# Patient Record
Sex: Female | Born: 1940 | Race: White | Hispanic: No | Smoking: Former smoker
Health system: Southern US, Community
[De-identification: ages and names within clinical notes are randomized; demographics above are authoritative.]

## PROBLEM LIST (undated history)

## (undated) DIAGNOSIS — I10 Essential (primary) hypertension: Secondary | ICD-10-CM

## (undated) DIAGNOSIS — N390 Urinary tract infection, site not specified: Secondary | ICD-10-CM

## (undated) DIAGNOSIS — E785 Hyperlipidemia, unspecified: Secondary | ICD-10-CM

## (undated) DIAGNOSIS — R51 Headache: Secondary | ICD-10-CM

## (undated) DIAGNOSIS — F329 Major depressive disorder, single episode, unspecified: Secondary | ICD-10-CM

## (undated) DIAGNOSIS — G8929 Other chronic pain: Secondary | ICD-10-CM

## (undated) DIAGNOSIS — M1990 Unspecified osteoarthritis, unspecified site: Secondary | ICD-10-CM

## (undated) DIAGNOSIS — M810 Age-related osteoporosis without current pathological fracture: Secondary | ICD-10-CM

## (undated) DIAGNOSIS — F32A Depression, unspecified: Secondary | ICD-10-CM

## (undated) DIAGNOSIS — D649 Anemia, unspecified: Secondary | ICD-10-CM

## (undated) DIAGNOSIS — J449 Chronic obstructive pulmonary disease, unspecified: Secondary | ICD-10-CM

## (undated) DIAGNOSIS — I509 Heart failure, unspecified: Secondary | ICD-10-CM

## (undated) DIAGNOSIS — H269 Unspecified cataract: Secondary | ICD-10-CM

## (undated) DIAGNOSIS — K219 Gastro-esophageal reflux disease without esophagitis: Secondary | ICD-10-CM

## (undated) DIAGNOSIS — J8410 Pulmonary fibrosis, unspecified: Secondary | ICD-10-CM

## (undated) DIAGNOSIS — R519 Headache, unspecified: Secondary | ICD-10-CM

## (undated) HISTORY — PX: TUBAL LIGATION: SHX77

## (undated) HISTORY — DX: Depression, unspecified: F32.A

## (undated) HISTORY — DX: Essential (primary) hypertension: I10

## (undated) HISTORY — DX: Unspecified cataract: H26.9

## (undated) HISTORY — DX: Chronic obstructive pulmonary disease, unspecified: J44.9

## (undated) HISTORY — DX: Headache, unspecified: R51.9

## (undated) HISTORY — DX: Other chronic pain: G89.29

## (undated) HISTORY — DX: Age-related osteoporosis without current pathological fracture: M81.0

## (undated) HISTORY — PX: CATARACT EXTRACTION: SUR2

## (undated) HISTORY — DX: Anemia, unspecified: D64.9

## (undated) HISTORY — DX: Headache: R51

## (undated) HISTORY — PX: ABDOMINAL HYSTERECTOMY: SHX81

## (undated) HISTORY — DX: Major depressive disorder, single episode, unspecified: F32.9

## (undated) HISTORY — DX: Hyperlipidemia, unspecified: E78.5

## (undated) HISTORY — PX: PARTIAL HYSTERECTOMY: SHX80

---

## 1985-02-19 HISTORY — PX: NECK SURGERY: SHX720

## 2004-06-13 ENCOUNTER — Encounter: Payer: Self-pay | Admitting: Emergency Medicine

## 2009-02-09 ENCOUNTER — Encounter: Payer: Self-pay | Admitting: Emergency Medicine

## 2009-02-13 ENCOUNTER — Encounter: Payer: Self-pay | Admitting: Emergency Medicine

## 2009-06-01 ENCOUNTER — Encounter: Payer: Self-pay | Admitting: Emergency Medicine

## 2009-08-19 ENCOUNTER — Encounter: Payer: Self-pay | Admitting: Emergency Medicine

## 2009-11-22 ENCOUNTER — Encounter: Payer: Self-pay | Admitting: Emergency Medicine

## 2009-11-24 ENCOUNTER — Encounter: Payer: Self-pay | Admitting: Emergency Medicine

## 2009-12-20 ENCOUNTER — Encounter: Payer: Self-pay | Admitting: Emergency Medicine

## 2010-01-26 DIAGNOSIS — E119 Type 2 diabetes mellitus without complications: Secondary | ICD-10-CM

## 2010-01-26 DIAGNOSIS — I10 Essential (primary) hypertension: Secondary | ICD-10-CM | POA: Insufficient documentation

## 2010-01-27 ENCOUNTER — Ambulatory Visit: Payer: Self-pay | Admitting: Emergency Medicine

## 2010-01-27 DIAGNOSIS — I272 Pulmonary hypertension, unspecified: Secondary | ICD-10-CM | POA: Insufficient documentation

## 2010-01-27 DIAGNOSIS — R51 Headache: Secondary | ICD-10-CM

## 2010-01-27 DIAGNOSIS — D869 Sarcoidosis, unspecified: Secondary | ICD-10-CM

## 2010-01-27 DIAGNOSIS — R519 Headache, unspecified: Secondary | ICD-10-CM | POA: Insufficient documentation

## 2010-01-27 DIAGNOSIS — J8410 Pulmonary fibrosis, unspecified: Secondary | ICD-10-CM

## 2010-01-30 ENCOUNTER — Telehealth (INDEPENDENT_AMBULATORY_CARE_PROVIDER_SITE_OTHER): Payer: Self-pay | Admitting: *Deleted

## 2010-03-02 ENCOUNTER — Telehealth: Payer: Self-pay | Admitting: Emergency Medicine

## 2010-03-02 ENCOUNTER — Ambulatory Visit
Admission: RE | Admit: 2010-03-02 | Discharge: 2010-03-02 | Payer: Self-pay | Source: Home / Self Care | Attending: Emergency Medicine | Admitting: Emergency Medicine

## 2010-03-17 ENCOUNTER — Ambulatory Visit: Admission: RE | Admit: 2010-03-17 | Discharge: 2010-03-17 | Payer: Self-pay | Source: Home / Self Care

## 2010-03-17 ENCOUNTER — Ambulatory Visit (HOSPITAL_COMMUNITY)
Admission: RE | Admit: 2010-03-17 | Discharge: 2010-03-17 | Payer: Self-pay | Source: Home / Self Care | Attending: Emergency Medicine | Admitting: Emergency Medicine

## 2010-03-20 ENCOUNTER — Telehealth (INDEPENDENT_AMBULATORY_CARE_PROVIDER_SITE_OTHER): Payer: Self-pay | Admitting: *Deleted

## 2010-03-21 NOTE — Assessment & Plan Note (Signed)
Summary: dyspnea, ? ILD, ? PAH   Visit Type:  Initial Consult Copy to:  Dr. Juleen China Primary Jaxsyn Azam/Referring Giara Mcgaughey:  Dr. Juleen China  CC:  Pulmonary consult for [facility].  History of Present Illness: 70 yo woman, former tobacco, recently dx w DM. Referred by Dr Leonor Liv for dyspnea and hx ILD followed by Dr DeCoy in [place], initially received dx of IPF. Underwent FOB and Bx in 2005, treated with pred and cytoxan in the past. Then had nodules on arm that were bx and showed sarcoidosis Summer 2011. Last CT scan was prob 01/2009 (she isn't sure). She has also been told that she has Pulm HTN by TTE (she can't remember when).   She has had an ONO that showed she needs O2 at night. She wears 3L/min at bedtime. She had PSG last summer [facility] - said she does not have OSA but needs O2.   She complains today of exertional SOB, rare CP. Able to walk thru a store without stopping, trouble stairs or hills.       Preventive Screening-Counseling & Management  Alcohol-Tobacco     Smoking Status: quit     Packs/Day: 1.0     Year Started: 1965     Year Quit: 1973  Current Medications (verified): 1)  Novolog Flexpen 100 Unit/ml Soln (Insulin Aspart) .... As Directed 2)  Levemir Flexpen 100 Unit/ml Soln (Insulin Detemir) .... As Directed 3)  Cyclobenzaprine Hcl 10 Mg Tabs (Cyclobenzaprine Hcl) .Marland Kitchen.. 1 Three Times A Day As Needed 4)  Tramadol Hcl 50 Mg Tabs (Tramadol Hcl) .Marland Kitchen.. 1 Three Times A Day As Needed 5)  Furosemide 40 Mg Tabs (Furosemide) .Marland Kitchen.. 1 Once Daily 6)  Omeprazole 20 Mg Cpdr (Omeprazole) .Marland Kitchen.. 1 Once Daily 7)  Spironolactone 25 Mg Tabs (Spironolactone) .Marland Kitchen.. 1 Two Times A Day 8)  Citalopram Hydrobromide 40 Mg Tabs (Citalopram Hydrobromide) .Marland Kitchen.. 1 Once Daily 9)  Ropinirole Hcl 3 Mg Tabs (Ropinirole Hcl) .Marland Kitchen.. 1 Two Times A Day 10)  Klor-Con 10 10 Meq Cr-Tabs (Potassium Chloride) .... 2 By Mouth Every Morning 11)  Divalproex Sodium 500 Mg Tbec (Divalproex Sodium) .... 2 Once  Daily  Allergies (verified): 1)  ! Pcn  Past History:  Past Medical History:  HEADACHE, CHRONIC (ICD-784.0) HYPERTENSION (ICD-401.9) DIABETES, TYPE 2 (ICD-250.00)  Past Surgical History: Neck surgery in 1987 Partial hysterectomy Tubal ligation  Family History: Family History Prostate Cancer---brother Family History C V A / Stroke ---mother Family History Asthma---son and granddaughter Family History Emphysema ---father Family History Lung Cancer---sister  Social History: Widowed Disabled Patient states former smoker.  Lives alone Smoking Status:  quit Packs/Day:  1.0  Vital Signs:  Patient profile:   70 year old female Height:      66 inches (167.64 cm) Weight:      185.38 pounds (84.26 kg) BMI:     30.03 O2 Sat:      92 % on Room air Temp:     98.2 degrees F (36.78 degrees C) oral Pulse rate:   99 / minute BP sitting:   122 / 64  (left arm) Cuff size:   large  Vitals Entered By: Michel Bickers CMA (January 27, 2010 3:37 PM)  O2 Sat at Rest %:  92 O2 Flow:  Room air CC: Pulmonary consult for [facility] Is Patient Diabetic? Yes Comments Medications reviewed with patient Michel Bickers [facility]  January 27, 2010 3:38 PM  Ambulatory Pulse Oximetry  Resting; HR_100____    02 Sat__95% RA___  Lap1 (185 feet)   HR_103____   02 Sat__92% RA___ Lap2 (185 feet)   HR_116____   02 Sat_90% RA____    Lap3 (185 feet)   HR_104____   02 Sat__91% RA___ **For approx 1/2 of the first part of 3rd lap, pts o2 sat stayed between 87-89% RA but did increased to low 90's% RA at then end of the 3rd lap. _x__Test Completed without Difficulty ___Test Stopped due to:  Gweneth Dimitri RN  January 27, 2010 4:44 PM    Physical Exam  General:  normal appearance and healthy appearing.   Head:  alopecia, no lesions Eyes:  conjunctiva and sclera clear Nose:  no deformity, discharge, inflammation, or lesions Mouth:  no deformity or lesions Neck:  no stridor Chest Wall:  no deformities  noted Lungs:  few soft insp crackles B Heart:  tachy, intact S1 and S2, holosystolic M, no gallop Abdomen:  benign Extremities:  no edema Neurologic:  poor historian, unable to tell me when events took place or tests were done but reorients quickly, interacts normally otherwise. 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 Likely sarcoidosis if this dx is accurate - need to review her imaging, likely repeat Ct scan if none done recently  Problem # 2:  ? of PULMONARY SARCOIDOSIS (ICD-135) The likely source of her ILD if present.  - will get all bx data, testing - consider rx once dx firm; she has new DM, may need a steroid sparing agent  Problem # 3:  ? of PULMONARY HYPERTENSION (ICD-416.8)  - will obtain TTE's from [place]  Orders: Consultation Level IV (69629)  Problem # 4:  DIABETES, TYPE 2 (ICD-250.00)  Her updated medication list for this problem includes:    Novolog Flexpen 100 Unit/ml Soln (Insulin aspart) .Marland Kitchen... As directed    Levemir Flexpen 100 Unit/ml Soln (Insulin detemir) .Marland Kitchen... As directed  Problem # 5:  HYPERTENSION (ICD-401.9)  Her updated medication list for this problem includes:    Furosemide 40 Mg Tabs (Furosemide) .Marland Kitchen... 1 once daily    Spironolactone 25 Mg Tabs (Spironolactone) .Marland Kitchen... 1 two times a day  Medications Added to Medication List This Visit: 1)  Klor-con 10 10 Meq Cr-tabs (Potassium chloride) .... 2 by mouth every morning  Patient Instructions: 1)  We will obtain [facility] records from [facility], including your CT scan's, biopsy results, echocardiogram results, before we order any new testing.  2)  Walking oximetry today 3)  Follow up with Dr Delton Coombes in 1 month. At that time we will decide about repeating your CT scan, your  echocardiogram, or about starting a medication for sarcoidosis.

## 2010-03-23 NOTE — Assessment & Plan Note (Signed)
Summary: ILD, sarcoidosis   Visit Type:  Follow-up Copy to:  Dr. Juleen China Primary Provider/Referring Provider:  Dr. Juleen China  CC:  ILD.Marland KitchenMarland Kitchenpt c/o prod cough x3 weeks...sputum is yellow...no change better or worse in her breathing.  History of Present Illness: 70 yo woman, former tobacco, recently dx w DM. Referred by Dr Leonor Liv for dyspnea and hx ILD followed by Dr DeCoy in [place], initially received dx of IPF. Underwent FOB and Bx in 2005, treated with pred and cytoxan in the past. Then had nodules on arm that were bx and showed  probable sarcoidosis Summer 2011 ([place]). Last CT scan was prob 11/2009 (she isn't sure). 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   She has had an ONO that showed she needs O2 at night. She wears 3L/min at bedtime. She had PSG last summer [facility] - said she does not have OSA but needs O2.   She complains today of exertional SOB, rare CP. Able to walk thru a store without stopping, trouble stairs or hills.   ROV 03/02/10 -- returns for f/u of ILD, suspected sarcoidosis based on skin bx. Also some secondary PAH. Last prednisone was 2009.       Preventive Screening-Counseling & Management  Alcohol-Tobacco     Smoking Status: quit     Packs/Day: 1.0     Year Started: 1965     Year Quit: 1973  Current Medications (verified): 1)  Novolog Flexpen 100 Unit/ml Soln (Insulin Aspart) .... As Directed 2)  Levemir Flexpen 100 Unit/ml Soln (Insulin Detemir) .... As Directed 3)  Cyclobenzaprine Hcl 10 Mg Tabs (Cyclobenzaprine Hcl) .Marland Kitchen.. 1 Three Times A Day As Needed 4)  Tramadol Hcl 50 Mg Tabs (Tramadol Hcl) .Marland Kitchen.. 1 Three Times A Day As Needed 5)  Furosemide 40 Mg Tabs (Furosemide) .Marland Kitchen.. 1 Once Daily 6)  Omeprazole 20 Mg Cpdr (Omeprazole) .Marland Kitchen.. 1 Once Daily 7)  Spironolactone 25 Mg Tabs (Spironolactone) .Marland Kitchen.. 1 Two Times A Day 8)  Citalopram Hydrobromide 40 Mg Tabs (Citalopram Hydrobromide) .Marland Kitchen.. 1 Once Daily 9)  Ropinirole Hcl 3 Mg Tabs (Ropinirole Hcl) .Marland Kitchen.. 1  Two Times A Day 10)  Klor-Con 10 10 Meq Cr-Tabs (Potassium Chloride) .... 2 By Mouth Every Morning 11)  Divalproex Sodium 500 Mg Tbec (Divalproex Sodium) .... 2 Once Daily 12)  Tessalon 200 Mg Caps (Benzonatate) .Marland Kitchen.. 1 By Mouth Three Times A Day As Needed  Allergies (verified): 1)  ! Pcn  Vital Signs:  Patient profile:   70 year old female Height:      66 inches (167.64 cm) Weight:      187.38 pounds (85.17 kg) BMI:     30.35 O2 Sat:      92 % on Room air Temp:     97.9 degrees F (36.61 degrees C) oral Pulse rate:   97 / minute BP sitting:   120 / 64  (left arm) Cuff size:   large  Vitals Entered By: Michel Bickers CMA (March 02, 2010 2:05 PM)  O2 Sat at Rest %:  92 O2 Flow:  Room air CC: ILD.Marland KitchenMarland Kitchenpt c/o prod cough x3 weeks...sputum is yellow...no change better or worse in her breathing Is Patient Diabetic? Yes Comments Medications reviewed with patient Michel Bickers CMA  March 02, 2010 2:15 PM   Physical Exam  General:  normal appearance and healthy appearing.   Head:  alopecia, no lesions Eyes:  conjunctiva and sclera clear Nose:  no deformity, discharge, inflammation, or lesions Mouth:  no deformity or lesions Neck:  no stridor Chest Wall:  no deformities noted Lungs:  few soft insp crackles B Heart:  tachy, intact S1 and S2, holosystolic M, no gallop Abdomen:  benign Extremities:  no edema Neurologic:  poor historian, unable to tell me when events took place or tests were done but reorients quickly, interacts normally otherwise. No focal deficits Skin:  sub-cm nodules on forearms, non-tender Psych:  alert and cooperative; normal mood and affect; normal attention span and concentration   Impression & Recommendations:  Problem # 1:  INTERSTITIAL LUNG DISEASE (ICD-515)  Problem # 2:  ? of PULMONARY SARCOIDOSIS (ICD-135)  Problem # 3:  ? of PULMONARY HYPERTENSION (ICD-416.8)  Orders: Est. Patient Level IV (60454) Endocrinology Referral (Endocrine)  Medications  Added to Medication List This Visit: 1)  Tessalon 200 Mg Caps (Benzonatate) .Marland Kitchen.. 1 by mouth three times a day as needed  Patient Instructions: 1)  We will get your CT scan of the chest from [place] [facility] 2)  We will obtain skin biopsy results fro your dermatologist in [place].  3)  We will perform an echocardiogram to evaluate your pulmonary hypertension.  4)  Follow up with Dr Delton Coombes in 1 month to review your echocardiogram and to decide whether you need another CT scan of the chest.  Prescriptions: TESSALON 200 MG CAPS (BENZONATATE) 1 by mouth three times a day as needed  #45 x 1   Entered and Authorized by:   Leslye Peer MD   Signed by:   Leslye Peer MD on 03/02/2010   Method used:   Electronically to        [facility] [phone]* (retail)       [address] [place], [place]  [address]       Ph: [phone] or [phone]       Fax: [phone]   RxID:   [id number]    Immunization History:  Influenza Immunization History:    Influenza:  historical (11/19/2009)

## 2010-03-23 NOTE — Progress Notes (Signed)
  Phone Note Other Incoming   Request: Send information Summary of Call: Records received from [facility]. 93 pages forwarded to Dr. Delton Coombes for review.

## 2010-03-23 NOTE — Progress Notes (Addendum)
Summary: skin biopsy report from derm in [place]  Phone Note Call from Patient   Caller: Patient Call For: dr Sherard Sutch Summary of Call: patient phoned stated that she was to call back with the name of the doctor that completed her biopsy Brain Ardelle Park MD [address] [place] Initial call taken by: Vedia Coffer,  March 02, 2010 5:01 PM  Follow-up for Phone Call        This is an FYI for RB and Lori-will forward message to both parties. Reynaldo Minium CMA  March 02, 2010 5:03 PM   Dr. Junius Roads in [place] [phone], dermatology. They had one skin bx of the left forearm from July 2011 and this has been placed in RB look at folder. Follow-up by: Michel Bickers CMA,  March 03, 2010 4:54 PM  Additional Follow-up for Phone Call Additional follow up Details #1::        Thank you , I will review. Leslye Peer MD  March 06, 2010 8:56 PM  Additional Follow-up by: Leslye Peer MD,  March 06, 2010 8:56 PM     Appended Document: skin biopsy report from derm in [place] Reviewed, most consistent w sarcoidosis. RSB

## 2010-03-23 NOTE — Letter (Signed)
Summary: [facility]  [facility]   Imported By: Sherian Rein 02/16/2010 11:48:56  _____________________________________________________________________  External Attachment:    Type:   Image     Comment:   External Document

## 2010-03-23 NOTE — Letter (Signed)
Summary: [facility]  [facility]   Imported By: Sherian Rein 02/16/2010 11:58:14  _____________________________________________________________________  External Attachment:    Type:   Image     Comment:   External Document

## 2010-03-29 NOTE — Progress Notes (Signed)
Summary: results  Phone Note Call from Patient Call back at Home Phone [phone]   Caller: Patient Call For: byrum Reason for Call: Talk to Nurse, Lab or Test Results Summary of Call: Patient requesting results of echo. Initial call taken by: Lehman Prom,  March 20, 2010 4:23 PM  Follow-up for Phone Call        Spoke with pt and sched appt for 1 month followup with RB to go over these results per RB append.  Pt okay with this. Appt sched for 04/18/10 at 4:20 pm. Follow-up by: Vernie Murders,  March 20, 2010 5:13 PM

## 2010-04-18 ENCOUNTER — Ambulatory Visit: Payer: Self-pay | Admitting: Emergency Medicine

## 2010-04-18 NOTE — Letter (Signed)
Summary: Marylen Ponto MD/[facility]  Marylen Ponto MD/[facility]   Imported By: Lester [place] 04/12/2010 08:37:32  _____________________________________________________________________  External Attachment:    Type:   Image     Comment:   External Document

## 2010-05-01 ENCOUNTER — Other Ambulatory Visit: Payer: Self-pay | Admitting: Emergency Medicine

## 2010-05-01 ENCOUNTER — Encounter: Payer: Self-pay | Admitting: Emergency Medicine

## 2010-05-01 ENCOUNTER — Ambulatory Visit (INDEPENDENT_AMBULATORY_CARE_PROVIDER_SITE_OTHER): Payer: Medicare Other | Admitting: Emergency Medicine

## 2010-05-01 ENCOUNTER — Ambulatory Visit (INDEPENDENT_AMBULATORY_CARE_PROVIDER_SITE_OTHER)
Admission: RE | Admit: 2010-05-01 | Discharge: 2010-05-01 | Disposition: A | Discharge status: Home / Self Care | Payer: Medicare Other | Source: Ambulatory Visit | Attending: Emergency Medicine | Admitting: Emergency Medicine

## 2010-05-01 DIAGNOSIS — J8410 Pulmonary fibrosis, unspecified: Secondary | ICD-10-CM

## 2010-05-04 ENCOUNTER — Telehealth: Payer: Self-pay | Admitting: Emergency Medicine

## 2010-05-09 NOTE — Assessment & Plan Note (Signed)
Summary: sarcoidosis, PAH, ILD   Visit Type:  Follow-up Copy to:  Dr. Juleen China Primary Provider/Referring Provider:  Dr. Juleen China  CC:  Sarcoid.  ? PAH.  Sarcoid.  Pt c/o cough w/ yellow sputum...just finished 10 day course of Avelox on 04/30/2010 (Dr. Leonor Liv has DX: PNA).  History of Present Illness: 70 yo woman, former tobacco, recently dx w DM. Referred by Dr Leonor Liv for dyspnea and hx ILD followed by Dr DeCoy in [place], initially received dx of IPF. Underwent FOB and Bx in 2005, treated with pred and cytoxan in the past. Then had nodules on arm that were bx and showed  probable sarcoidosis Summer 2011 ([place]). Last CT scan was prob 11/2009 (she isn't sure). 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   She has had an ONO that showed she needs O2 at night. She wears 3L/min at bedtime. She had PSG last summer [facility] - said she does not have OSA but needs O2.   She complains today of exertional SOB, rare CP. Able to walk thru a store without stopping, trouble stairs or hills.   ROV 03/02/10 -- returns for f/u of ILD, suspected sarcoidosis based on skin bx. Also some secondary PAH. Last prednisone was 2009.   ROV 05/01/10 -- f/u sarcoidosis, assoc ILD and PAH. TTE since last visit PASp  ~ 36 mmHg. Saw Dr Leonor Liv about 10 days ago, has had about 5 weeks cough, congestion, low grade fever. Treated 2 rounds abx, most recently avelox by Dr Leonor Liv. No prednisone. Feels like she is improving, but still limited by SOB. No PFT on record, not currently on BDs.        Current Medications (verified): 1)  Novolog Flexpen 100 Unit/ml Soln (Insulin Aspart) .... As Directed 2)  Levemir Flexpen 100 Unit/ml Soln (Insulin Detemir) .... As Directed 3)  Cyclobenzaprine Hcl 10 Mg Tabs (Cyclobenzaprine Hcl) .Marland Kitchen.. 1 Three Times A Day As Needed 4)  Tramadol Hcl 50 Mg Tabs (Tramadol Hcl) .Marland Kitchen.. 1 Three Times A Day As Needed 5)  Furosemide 40 Mg Tabs (Furosemide) .Marland Kitchen.. 1 Once Daily 6)  Omeprazole 20 Mg Cpdr  (Omeprazole) .Marland Kitchen.. 1 Once Daily 7)  Spironolactone 25 Mg Tabs (Spironolactone) .Marland Kitchen.. 1 Two Times A Day 8)  Citalopram Hydrobromide 40 Mg Tabs (Citalopram Hydrobromide) .Marland Kitchen.. 1 Once Daily 9)  Ropinirole Hcl 3 Mg Tabs (Ropinirole Hcl) .Marland Kitchen.. 1 Two Times A Day 10)  Klor-Con 10 10 Meq Cr-Tabs (Potassium Chloride) .... 2 By Mouth Every Morning 11)  Divalproex Sodium 500 Mg Tbec (Divalproex Sodium) .... 2 Once Daily 12)  Tessalon 200 Mg Caps (Benzonatate) .Marland Kitchen.. 1 By Mouth Three Times A Day As Needed  Allergies (verified): 1)  ! Pcn  Vital Signs:  Patient profile:   70 year old female Height:      66 inches (167.64 cm) Weight:      190 pounds (86.36 kg) BMI:     30.78 O2 Sat:      96 % on Room air Temp:     98.1 degrees F (36.72 degrees C) oral Pulse rate:   105 / minute BP sitting:   116 / 58  (right arm) Cuff size:   large  Vitals Entered By: Michel Bickers CMA (May 01, 2010 8:45 AM)  O2 Sat at Rest %:  96 O2 Flow:  Room air CC: Sarcoid.  ? PAH.  Sarcoid.  Pt c/o cough w/ yellow sputum...just finished 10 day course of Avelox on 04/30/2010 (  Dr. Leonor Liv has DX: PNA) Comments Medications reviewed with patient Michel Bickers CMA  May 01, 2010 8:53 AM   Physical Exam  General:  normal appearance and healthy appearing.   Head:  alopecia, no lesions Eyes:  conjunctiva and sclera clear Nose:  no deformity, discharge, inflammation, or lesions Mouth:  no deformity or lesions Neck:  no stridor Chest Wall:  no deformities noted Lungs:  few soft insp crackles B, no wheezes Heart:  tachy, intact S1 and S2, holosystolic M, no gallop Abdomen:  benign Extremities:  no edema Skin:  sub-cm nodules on forearms, non-tender Psych:  alert and cooperative; normal mood and affect; normal attention span and concentration   Impression & Recommendations:  Problem # 1:  PULMONARY SARCOIDOSIS (ICD-135) full PFT, consider BD's. Expect that recent URI prolonged by the sarcoidosis and poss AFL  Problem # 2:   INTERSTITIAL LUNG DISEASE (ICD-515) CXR today  Problem # 3:  PULMONARY HYPERTENSION (ICD-416.8) PASP , may merit treatment at some point but would like to optimize sarcoid first. No OSA on PSg.   Other Orders: Est. Patient Level IV (99214) T-2 View CXR (71020TC)  Patient Instructions: 1)  CXR today 2)  We will perform full pulmonary function testing at your next visit 3)  Follow up with Dr Delton Coombes next available with the PFTs.

## 2010-05-18 NOTE — Progress Notes (Signed)
Summary: results -- PT CALLED BACK  Phone Note Call from Patient Call back at Home Phone [phone]   Caller: Patient Call For: Myonna Chisom Summary of Call: req results cxr Initial call taken by: Tivis Ringer, CNA,  May 04, 2010 1:36 PM  Follow-up for Phone Call        called and spoke with pt. pt requesting cxr results from 05/01/2010.  Results unsigned.  Please advise.  Thank you.  Aundra Millet Reynolds LPN  May 04, 2010 3:00 PM   Pt calling again for cxr results.Darletta Moll  May 08, 2010 4:43 PM  Dr. Delton Coombes, pt had CXR done on 05/01/10 and requesting results.  They are in EMR but unsigned.  Pls advise.  Thanks! Follow-up by: Gweneth Dimitri RN,  May 08, 2010 4:46 PM  Additional Follow-up for Phone Call Additional follow up Details #1::        Please notify pt that her CXR shows no significant abnormalities. Thanks Leslye Peer MD  May 08, 2010 6:24 PM  Additional Follow-up by: Leslye Peer MD,  May 08, 2010 6:24 PM    Additional Follow-up for Phone Call Additional follow up Details #2::    called and spoke with pt and she is aware of cxr results per RB Randell Loop CMA  May 09, 2010 5:57 PM

## 2010-06-02 ENCOUNTER — Ambulatory Visit: Payer: Medicare Other | Admitting: Emergency Medicine

## 2010-06-21 ENCOUNTER — Encounter: Payer: Self-pay | Admitting: Emergency Medicine

## 2010-06-23 ENCOUNTER — Ambulatory Visit (INDEPENDENT_AMBULATORY_CARE_PROVIDER_SITE_OTHER): Payer: Medicare Other | Admitting: Emergency Medicine

## 2010-06-23 ENCOUNTER — Encounter: Payer: Self-pay | Admitting: Emergency Medicine

## 2010-06-23 DIAGNOSIS — J8410 Pulmonary fibrosis, unspecified: Secondary | ICD-10-CM

## 2010-06-23 DIAGNOSIS — D869 Sarcoidosis, unspecified: Secondary | ICD-10-CM

## 2010-06-23 DIAGNOSIS — J302 Other seasonal allergic rhinitis: Secondary | ICD-10-CM | POA: Insufficient documentation

## 2010-06-23 DIAGNOSIS — I2789 Other specified pulmonary heart diseases: Secondary | ICD-10-CM

## 2010-06-23 DIAGNOSIS — J309 Allergic rhinitis, unspecified: Secondary | ICD-10-CM

## 2010-06-23 LAB — PULMONARY FUNCTION TEST

## 2010-06-23 MED ORDER — ALBUTEROL SULFATE HFA 108 (90 BASE) MCG/ACT IN AERS: 2.0000 | INHALATION_SPRAY | RESPIRATORY_TRACT | Status: DC | PRN

## 2010-06-23 MED ORDER — LORATADINE 10 MG PO TABS: 10.0000 mg | ORAL_TABLET | Freq: Every day | ORAL | Status: DC

## 2010-06-23 NOTE — Patient Instructions (Signed)
We will prescribe albuterol inhaler for you to use as needed Follow up with Dr Delton Coombes in 6 months with a CXR

## 2010-06-23 NOTE — Progress Notes (Signed)
  Subjective:    Patient ID: Penny Curry, female    DOB: 02/15/1941, 70 y.o.   MRN: [id number]  HPI 70 yo woman, former tobacco, recently dx w DM. Referred by Dr Leonor Liv for dyspnea and hx ILD followed by Dr DeCoy in [place], initially received dx of IPF. Underwent FOB and Bx in 2005, treated with pred and cytoxan in the past. Then had nodules on arm that were bx and showed probable sarcoidosis Summer 2011 ([place]). Last CT scan was prob 11/2009 (she isn't sure). 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   She has had an ONO that showed she needs O2 at night. She wears 3L/min at bedtime. She had PSG last summer [facility] - said she does not have OSA but needs O2.   She complains today of exertional SOB, rare CP. Able to walk thru a store without stopping, trouble stairs or hills.   ROV 03/02/10 -- returns for f/u of ILD, suspected sarcoidosis based on skin bx. Also some secondary PAH. Last prednisone was 2009.   ROV 05/01/10 -- f/u sarcoidosis, assoc ILD and PAH. TTE since last visit PASp ~ 36 mmHg. Saw Dr Leonor Liv about 10 days ago, has had about 5 weeks cough, congestion, low grade fever. Treated 2 rounds abx, most recently avelox by Dr Leonor Liv. No prednisone. Feels like she is improving, but still limited by SOB. No PFT on record, not currently on BDs.   ROV 06/23/10 -- sarcoidosis, assoc ILD, PAH. 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 Pseudo-normalization), decreased diffusion that corrects for Va. Has been feeling fairly well, was seen for abd pain, treated w cipro for ? Diverticulitis or UTI. Breathing has been fair, she believes that the heat and pollen make her worse. Not currently on allergy regimen. Has had some typical sarcoid rash on arms, has resolved (march 12).     Review of Systems Per HPI    Objective:   Physical Exam Gen: Pleasant, well-nourished, in no distress,  normal affect  ENT: No lesions,  mouth clear,  oropharynx clear, no  postnasal drip  Neck: No JVD, no TMG, no carotid bruits  Lungs: No use of accessory muscles, no dullness to percussion, clear without rales or rhonchi  Cardiovascular: RRR, heart sounds normal, no murmur or gallops, no peripheral edema  Musculoskeletal: No deformities, no cyanosis or clubbing  Neuro: alert, non focal  Skin: Warm, no lesions or rashes           Assessment & Plan:

## 2010-06-23 NOTE — Assessment & Plan Note (Signed)
Start loratadine 

## 2010-06-23 NOTE — Assessment & Plan Note (Signed)
PFT show mixed disease.  - start prn albuterol - CXR next time.  - F/u 6 months or prn

## 2010-06-23 NOTE — Progress Notes (Signed)
PFT done today. 

## 2010-06-28 ENCOUNTER — Telehealth: Payer: Self-pay | Admitting: Emergency Medicine

## 2010-06-28 NOTE — Telephone Encounter (Signed)
Pt c/o rash on both arms starting at elbow and going "part way down". Bumps are small, red, fluid filled, intact, warm to touch. She also states is the same type of rash as the one she had before that she told RB about previously. Pt aware RB out of office until tomorrow AM. Dr. Delton Coombes please advise. Thanks.

## 2010-06-29 NOTE — Telephone Encounter (Signed)
Pt has hx of sarcoidosis. RB not in the office this afternoon.  Therefore, will forward this message to doc of the day to address (Dr. Maple Hudson.) Allergies: PCN

## 2010-06-29 NOTE — Telephone Encounter (Signed)
Per CY-suggest Caladryl as far as poison ivy if no better 3-4 days see/speak with RB.Penny Curry

## 2010-06-29 NOTE — Telephone Encounter (Signed)
Called, spoke with pt.  She was informed of CDY's recs.  She verbalized understanding of this and will call back in a few days if this is no better or worsens.

## 2010-07-03 ENCOUNTER — Encounter: Payer: Self-pay | Admitting: Emergency Medicine

## 2011-01-05 ENCOUNTER — Ambulatory Visit (INDEPENDENT_AMBULATORY_CARE_PROVIDER_SITE_OTHER): Payer: Medicare Other | Admitting: Emergency Medicine

## 2011-01-05 ENCOUNTER — Other Ambulatory Visit (HOSPITAL_COMMUNITY): Payer: Self-pay | Admitting: Emergency Medicine

## 2011-01-05 ENCOUNTER — Encounter: Payer: Self-pay | Admitting: Emergency Medicine

## 2011-01-05 DIAGNOSIS — J8410 Pulmonary fibrosis, unspecified: Secondary | ICD-10-CM

## 2011-01-05 DIAGNOSIS — D869 Sarcoidosis, unspecified: Secondary | ICD-10-CM

## 2011-01-05 NOTE — Progress Notes (Signed)
  Subjective:    Patient ID: Penny Curry, female    DOB: Mar 16, 1940, 70 y.o.   MRN: [id number]  HPI 70 yo woman, former tobacco, recently dx w DM. Referred by Dr Leonor Liv for dyspnea and hx ILD followed by Dr DeCoy in [place], initially received dx of IPF. Underwent FOB and Bx in 2005, treated with pred and cytoxan in the past. Then had nodules on arm that were bx and showed probable sarcoidosis Summer 2011 ([place]). Last CT scan was prob 11/2009 (she isn't sure). 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   She has had an ONO that showed she needs O2 at night. She wears 3L/min at bedtime. She had PSG last summer [facility] - said she does not have OSA but needs O2.   She complains today of exertional SOB, rare CP. Able to walk thru a store without stopping, trouble stairs or hills.   ROV 03/02/10 -- returns for f/u of ILD, suspected sarcoidosis based on skin bx. Also some secondary PAH. Last prednisone was 2009.   ROV 05/01/10 -- f/u sarcoidosis, assoc ILD and PAH. TTE since last visit PASp ~ 36 mmHg. Saw Dr Leonor Liv about 10 days ago, has had about 5 weeks cough, congestion, low grade fever. Treated 2 rounds abx, most recently avelox by Dr Leonor Liv. No prednisone. Feels like she is improving, but still limited by SOB. No PFT on record, not currently on BDs.   ROV 06/23/10 -- sarcoidosis, assoc ILD, PAH. 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 Pseudo-normalization), decreased diffusion that corrects for Va. Has been feeling fairly well, was seen for abd pain, treated w cipro for ? Diverticulitis or UTI. Breathing has been fair, she believes that the heat and pollen make her worse. Not currently on allergy regimen. Has had some typical sarcoid rash on arms, has resolved (march 12).   ROV 01/05/11 -- 70 yo woman, sarcoidosis, assoc ILD, PAH. PFT 06/2010 = Moderate mixed disease (FEV1 1.63L, 75%). Regular f/u visit. Tells me that she is having more exertional SOB over  last 2 months. Very little cough. She does hear some wheeze occasionally w exertion.  She has been having scratchy throat, some difficulty swallowing with choking - has been happening for a long time, but worse last 2 months.  She is on loratadine and omeprazole.    Review of Systems Per HPI    Objective:   Physical Exam Gen: Pleasant, well-nourished, in no distress,  normal affect  ENT: No lesions,  mouth clear,  oropharynx clear, no postnasal drip  Neck: No JVD, no TMG, no carotid bruits  Lungs: No use of accessory muscles, no dullness to percussion, clear without rales or rhonchi  Cardiovascular: RRR, heart sounds normal, no murmur or gallops, no peripheral edema  Musculoskeletal: No deformities, no cyanosis or clubbing  Neuro: alert, non focal  Skin: Warm, no lesions or rashes   Assessment & Plan:  INTERSTITIAL LUNG DISEASE More dyspnea, seems to correspond in time with worsening dysphagia. ? Whether this has been a component of her ILD all along?  - repeat CT scan to compare w 2010 ([place]) - formal swallow eval - rov to review 1 mon  PULMONARY SARCOIDOSIS No rash, no wheeze. Unclear that there is active disease right now.  - will get CT scan, defer any pred or rx for sarcoid at this time - prn SABA

## 2011-01-05 NOTE — Assessment & Plan Note (Signed)
More dyspnea, seems to correspond in time with worsening dysphagia. ? Whether this has been a component of her ILD all along?  - repeat CT scan to compare w 2010 ([place]) - formal swallow eval - rov to review 1 mon

## 2011-01-05 NOTE — Assessment & Plan Note (Signed)
No rash, no wheeze. Unclear that there is active disease right now.  - will get CT scan, defer any pred or rx for sarcoid at this time - prn SABA

## 2011-01-05 NOTE — Patient Instructions (Signed)
We will repeat your CT scan of the chest  We will perform a swallowing evaluation  Use your rescue inhaler as needed for shortness of breath Follow with Dr Delton Coombes after your studies are done to review the results.

## 2011-01-09 ENCOUNTER — Ambulatory Visit (INDEPENDENT_AMBULATORY_CARE_PROVIDER_SITE_OTHER)
Admission: RE | Admit: 2011-01-09 | Discharge: 2011-01-09 | Disposition: A | Discharge status: Home / Self Care | Payer: Medicare Other | Source: Ambulatory Visit | Attending: Emergency Medicine | Admitting: Emergency Medicine

## 2011-01-09 DIAGNOSIS — J8410 Pulmonary fibrosis, unspecified: Secondary | ICD-10-CM

## 2011-01-10 ENCOUNTER — Ambulatory Visit (HOSPITAL_COMMUNITY)
Admission: RE | Admit: 2011-01-10 | Discharge: 2011-01-10 | Disposition: A | Discharge status: Home / Self Care | Payer: Medicare Other | Source: Ambulatory Visit | Attending: Emergency Medicine | Admitting: Emergency Medicine

## 2011-01-10 ENCOUNTER — Other Ambulatory Visit (HOSPITAL_COMMUNITY): Payer: Medicare Other

## 2011-01-10 DIAGNOSIS — R1310 Dysphagia, unspecified: Secondary | ICD-10-CM | POA: Insufficient documentation

## 2011-01-10 NOTE — Procedures (Signed)
Modified Barium Swallow Procedure Note Patient Details  Name: Penny Curry MRN: [id number] Date of Birth: 04-15-1940  Today's Date: 01/10/2011 Time:  -     Past Medical History:  Past Medical History  Diagnosis Date  . Chronic headache   . HTN (hypertension)   . Diabetes mellitus    Past Surgical History:  Past Surgical History  Procedure Date  . Neck surgery 1987  . Partial hysterectomy   . Tubal ligation    HPI:     Recommendation/Prognosis  Clinical Impression Dysphagia Diagnosis: Mild pharyngeal phase dysphagia Clinical impression: Sensory and motor components present.  Delayed swallow reflex due to decreased sensation for primary trigger site, trace vallecular residue which cleared with dry swallow or liquid wash, trace intermittent flash penetration of thin liquids, which cleared completely and was not aspirated Recommendations Solid Consistency: Regular Liquid Consistency: Thin Liquid Administration via: Cup;Straw Medication Administration: Whole meds with liquid Supervision: Patient able to self feed Compensations: Slow rate;Small sips/bites;Multiple dry swallows after each bite/sip;Follow solids with liquid Postural Changes and/or Swallow Maneuvers: Seated upright 90 degrees;Upright 30-60 min after meal Oral Care Recommendations: Oral care BID;Patient independent with oral care Prognosis Prognosis for Safe Diet Advancement: Good Individuals Consulted Consulted and Agree with Results and Recommendations: Patient Report Sent to : Referring physician  SLP Assessment/Plan    SLP Goals     General:  Other Pertinent Information: Pt reports increasing difficulty x2-3 months, with sensation of food getting "hung" in throat.  This is painful per pt.  Time and liauid wash assists to clear.   Type of Study: Repeat MBS (Pt had MBS in [place] 5-6 years ago.  No report available) Diet Prior to this Study: Regular;Thin liquids Temperature Spikes Noted:  No Respiratory Status: Room air History of Intubation: No Behavior/Cognition: Alert;Cooperative;Pleasant mood Oral Cavity - Dentition: Dentures, top;Dentures, bottom Oral Motor / Sensory Function: Within functional limits Vision: Functional for self-feeding Patient Positioning: Upright in chair Baseline Vocal Quality: Other (comment) (Raspy/hoarse vocal quality x7-8 months per pt.) Volitional Cough: Weak Volitional Swallow: Able to elicit Anatomy: Within functional limits Pharyngeal Secretions: Not observed secondary MBS Ice chips: Not tested  Reason for Referral:  Sensation of food sticking in throat.  Oral Phase Oral Preparation/Oral Phase Oral Phase: WFL Pharyngeal Phase  Pharyngeal Phase Pharyngeal Phase: Impaired Pharyngeal - Pudding Pharyngeal - Pudding Teaspoon: Premature spillage to valleculae;Delayed swallow initiation;Reduced tongue base retraction;Pharyngeal residue - valleculae Pharyngeal - Thin Pharyngeal - Thin Cup: Premature spillage to valleculae;Penetration/Aspiration during swallow;Reduced airway/laryngeal closure;Delayed swallow initiation Penetration/Aspiration details (thin cup): Material enters airway, remains ABOVE vocal cords then ejected out Pharyngeal - Thin Straw: Premature spillage to valleculae;Penetration/Aspiration during swallow;Reduced airway/laryngeal closure;Delayed swallow initiation Penetration/Aspiration details (thin straw): Material enters airway, remains ABOVE vocal cords then ejected out Pharyngeal - Solids Pharyngeal - Mechanical Soft: Premature spillage to valleculae;Delayed swallow initiation;Reduced tongue base retraction;Pharyngeal residue - valleculae;Compensatory strategies attempted (Comment) (Dry swallow and alternating solid with liquid effective) Cervical Esophageal Phase  Cervical Esophageal Phase Cervical Esophageal Phase: [facility]  Celia B. Bueche, MSP, CCC-SLP 01/10/2011, 11:09 AM

## 2011-01-15 ENCOUNTER — Telehealth: Payer: Self-pay | Admitting: Emergency Medicine

## 2011-01-15 NOTE — Telephone Encounter (Signed)
[email or web address].Penny Curry, CMA  

## 2011-01-17 NOTE — Telephone Encounter (Signed)
[id number] x1 

## 2011-01-17 NOTE — Telephone Encounter (Signed)
Pt returning triage's call & can be reached at [phone].  Penny Curry

## 2011-01-17 NOTE — Telephone Encounter (Signed)
The patient is calling for results on barium swallow and ct scan, both done in Nov 2012. She is aware that RB is out of the office and we will forward the msg to him and get back with her once RB has had a chance to review those results. Pt aware it may be first of the week before she receives a callback and is fine with this.

## 2011-01-22 ENCOUNTER — Telehealth: Payer: Self-pay | Admitting: Emergency Medicine

## 2011-01-22 NOTE — Telephone Encounter (Signed)
Pt called back again requesting these results asap. RB, please advise.  Thanks.

## 2011-01-22 NOTE — Telephone Encounter (Signed)
This is a duplicate message.  See phone note from 01/15/11

## 2011-01-23 ENCOUNTER — Telehealth: Payer: Self-pay | Admitting: Emergency Medicine

## 2011-01-23 NOTE — Telephone Encounter (Signed)
Pt calling again in reference to previous message can be reached at [phone].Penny Curry

## 2011-01-23 NOTE — Telephone Encounter (Signed)
Per Dr. Delton Coombes, recs from swallow test are to follow her current diet; alternate solids and liquids, small bites and sips. She also needs an appointment for follow-up to discuss these results and go over ct chest as well.   Pt notified and is scheduled to come for OV with RB on Thurs., 01/25/11 @ 2:30pm.

## 2011-01-23 NOTE — Telephone Encounter (Signed)
Error.Penny Curry ° °

## 2011-01-25 ENCOUNTER — Encounter: Payer: Self-pay | Admitting: Emergency Medicine

## 2011-01-25 ENCOUNTER — Ambulatory Visit (INDEPENDENT_AMBULATORY_CARE_PROVIDER_SITE_OTHER): Payer: Medicare Other | Admitting: Emergency Medicine

## 2011-01-25 DIAGNOSIS — J309 Allergic rhinitis, unspecified: Secondary | ICD-10-CM

## 2011-01-25 DIAGNOSIS — J8410 Pulmonary fibrosis, unspecified: Secondary | ICD-10-CM

## 2011-01-25 DIAGNOSIS — D869 Sarcoidosis, unspecified: Secondary | ICD-10-CM

## 2011-01-25 DIAGNOSIS — R053 Chronic cough: Secondary | ICD-10-CM | POA: Insufficient documentation

## 2011-01-25 DIAGNOSIS — R05 Cough: Secondary | ICD-10-CM

## 2011-01-25 DIAGNOSIS — J302 Other seasonal allergic rhinitis: Secondary | ICD-10-CM

## 2011-01-25 MED ORDER — OMEPRAZOLE 20 MG PO CPDR: 20.0000 mg | DELAYED_RELEASE_CAPSULE | Freq: Two times a day (BID) | ORAL | Status: DC

## 2011-01-25 MED ORDER — HYDROCOD POLST-CHLORPHEN POLST 10-8 MG/5ML PO LQCR: 5.0000 mL | Freq: Two times a day (BID) | ORAL | Status: DC | PRN

## 2011-01-25 NOTE — Assessment & Plan Note (Signed)
loratadine 

## 2011-01-25 NOTE — Assessment & Plan Note (Signed)
-   reviewed swallowing precautions.  - will get the CT scans from [place] to compare

## 2011-01-25 NOTE — Assessment & Plan Note (Addendum)
CT scan 11/20 with some scattered nodular disease, need to compare with old scans from [place]. No evidence ILD or honeycomb change.  - will likely need repeat CT scan in 1 year to follow

## 2011-01-25 NOTE — Progress Notes (Signed)
Subjective:    Patient ID: Penny Curry, female    DOB: [birth year], 70 y.o.   MRN: [id number]  HPI 70 yo woman, former tobacco, recently dx w DM. Referred by Dr Leonor Liv for dyspnea and hx ILD followed by Dr DeCoy in [place], initially received dx of IPF. Underwent FOB and Bx in 2005, treated with pred and cytoxan in the past. Then had nodules on arm that were bx and showed probable sarcoidosis Summer 2011 ([place]). Last CT scan was prob 11/2009 (she isn't sure). 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   She has had an ONO that showed she needs O2 at night. She wears 3L/min at bedtime. She had PSG last summer [facility] - said she does not have OSA but needs O2.   ROV 05/01/10 -- f/u sarcoidosis, assoc ILD and PAH. TTE since last visit PASp ~ 36 mmHg. Saw Dr Leonor Liv about 10 days ago, has had about 5 weeks cough, congestion, low grade fever. Treated 2 rounds abx, most recently avelox by Dr Leonor Liv. No prednisone. Feels like she is improving, but still limited by SOB. No PFT on record, not currently on BDs.   ROV 06/23/10 -- sarcoidosis, assoc ILD, PAH. 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 Pseudo-normalization), decreased diffusion that corrects for Va. Has been feeling fairly well, was seen for abd pain, treated w cipro for ? Diverticulitis or UTI. Breathing has been fair, she believes that the heat and pollen make her worse. Not currently on allergy regimen. Has had some typical sarcoid rash on arms, has resolved (march 12).   ROV 01/05/11 -- 70 yo woman, sarcoidosis, assoc ILD, PAH. PFT 06/2010 = Moderate mixed disease (FEV1 1.63L, 75%). Regular f/u visit. Tells me that she is having more exertional SOB over last 2 months. Very little cough. She does hear some wheeze occasionally w exertion.  She has been having scratchy throat, some difficulty swallowing with choking - has been happening for a long time, but worse last 2 months.  She is on loratadine and  omeprazole.   ROV 01/25/11 -- 70 yo woman, sarcoidosis, assoc ILD, PAH. PFT 06/2010 = Moderate mixed disease (FEV1 1.63L, 75%). She returns after having her swallowing eval and her CT chest. Need to get the CT scans from [place] [facility] to compare. She returns with more cough, green mucous, nasal gtt and congestion.    CT chest 01/09/11 --  Findings: No enlarged axillary or supraclavicular lymph nodes.  There is a right paratracheal lymph node which measures 0.9 cm,  image 19. AP window lymph node measures 0.7 cm. The subcarinal  lymph node measures 0.6 cm. No pericardial or pleural effusion.  Mild, lower lobe predominant peripheral interstitial reticulation  is noted.  No honeycombing identified. No significant bronchiectasis.  On the expiration images there is a mosaic attenuation pattern  throughout both lungs compatible with air trapping and small  airways disease.  Multifocal bilateral nodular densities are identified. The largest  is within the basilar portion of the left upper lobe measuring 1.5  cm and has spiculated margins.  Right upper lobe perihilar nodule measures 0.8 cm.  Ground-glass nodule is noted within the right upper lobe measuring  10 mm, image #9.  Review of the visualized osseous structures is unremarkable.  Limited imaging through the upper abdomen is negative.  IMPRESSION:  1. Interstitial, peripheral reticulation and multifocal nodular  densities are identified within both lungs. Findings are  consistent with the history of pulmonary sarcoid and interstitial  lung disease. Careful clinical correlation is recommended as the  differential consideration for the nodular opacities would include  metastatic disease.  2. No significant mediastinal or hilar adenopathy.   Review of Systems Per HPI    Objective:   Physical Exam Gen: Pleasant, well-nourished, in no distress,  normal affect  ENT: No lesions,  mouth clear,  oropharynx clear, no postnasal drip  Neck:  No JVD, no TMG, no carotid bruits  Lungs: No use of accessory muscles, no dullness to percussion, clear without rales or rhonchi  Cardiovascular: RRR, heart sounds normal, no murmur or gallops, no peripheral edema  Musculoskeletal: No deformities, no cyanosis or clubbing  Neuro: alert, non focal  Skin: Warm, no lesions or rashes   Assessment & Plan:  PULMONARY SARCOIDOSIS CT scan 11/20 with some scattered nodular disease, need to compare with old scans from [place]. No evidence ILD or honeycomb change.  - will likely need repeat CT scan in 1 year to follow  INTERSTITIAL LUNG DISEASE - reviewed swallowing precautions.  - will get the CT scans from [place] to compare  Allergic rhinitis, seasonal loratadine  Chronic cough - rx her PND and GERD - tussionex prn - benzonatate - swallow precautions.

## 2011-01-25 NOTE — Assessment & Plan Note (Signed)
-   rx her PND and GERD - tussionex prn - benzonatate - swallow precautions.

## 2011-01-25 NOTE — Patient Instructions (Addendum)
Will compare your CT scan to those from [facility] will likely need a repeat scan in 1 year Use your loratadine daily Increase prilosec to 20mg  twice a day Use tussionex up to twice a day if needed for cough Follow with Dr Delton Coombes in 3 months

## 2011-02-09 ENCOUNTER — Telehealth: Payer: Self-pay | Admitting: Emergency Medicine

## 2011-02-09 MED ORDER — GUAIFENESIN-CODEINE 100-10 MG/5ML PO SYRP: 5.0000 mL | ORAL_SOLUTION | Freq: Four times a day (QID) | ORAL | Status: DC | PRN

## 2011-02-09 NOTE — Telephone Encounter (Signed)
Pharmacy is aware of RB response. Nothing further was needed

## 2011-02-09 NOTE — Telephone Encounter (Signed)
Dr. Delton Coombes please advise if okay to substitute Tussionex with Robitussin AC.

## 2011-02-09 NOTE — Telephone Encounter (Signed)
Yes can do

## 2011-02-11 ENCOUNTER — Ambulatory Visit (INDEPENDENT_AMBULATORY_CARE_PROVIDER_SITE_OTHER): Payer: Medicare Other

## 2011-02-11 DIAGNOSIS — J158 Pneumonia due to other specified bacteria: Secondary | ICD-10-CM

## 2011-02-11 DIAGNOSIS — J8410 Pulmonary fibrosis, unspecified: Secondary | ICD-10-CM

## 2011-02-11 DIAGNOSIS — D62 Acute posthemorrhagic anemia: Secondary | ICD-10-CM

## 2011-03-15 ENCOUNTER — Ambulatory Visit (INDEPENDENT_AMBULATORY_CARE_PROVIDER_SITE_OTHER): Payer: Medicare Other

## 2011-03-15 DIAGNOSIS — R05 Cough: Secondary | ICD-10-CM

## 2011-03-15 DIAGNOSIS — R079 Chest pain, unspecified: Secondary | ICD-10-CM

## 2011-03-15 DIAGNOSIS — J189 Pneumonia, unspecified organism: Secondary | ICD-10-CM

## 2011-03-19 ENCOUNTER — Telehealth: Payer: Self-pay | Admitting: Emergency Medicine

## 2011-03-19 NOTE — Telephone Encounter (Signed)
Spoke with pt's daughter. She states that the pt has been dx with PNA and does not seem to be improving at all with taking avelox. She states that her breathing and her cough are just getting worse. I advised needs ov asap and scheduled her to see TP tomorrow am at 9:30 am. Advised that she seek emergency care sooner if needed and daughter verbalized understanding.

## 2011-03-20 ENCOUNTER — Ambulatory Visit: Payer: Medicare Other | Admitting: Adult Health

## 2011-03-22 ENCOUNTER — Telehealth: Payer: Self-pay

## 2011-03-22 ENCOUNTER — Ambulatory Visit (INDEPENDENT_AMBULATORY_CARE_PROVIDER_SITE_OTHER): Payer: Medicare Other | Admitting: Adult Health

## 2011-03-22 ENCOUNTER — Encounter: Payer: Self-pay | Admitting: Adult Health

## 2011-03-22 DIAGNOSIS — D869 Sarcoidosis, unspecified: Secondary | ICD-10-CM

## 2011-03-22 MED ORDER — PREDNISONE 10 MG PO TABS: ORAL_TABLET | ORAL | Status: DC

## 2011-03-22 MED ORDER — HYDROCODONE-HOMATROPINE 5-1.5 MG/5ML PO SYRP: 5.0000 mL | ORAL_SOLUTION | Freq: Four times a day (QID) | ORAL | Status: AC | PRN

## 2011-03-22 NOTE — Telephone Encounter (Signed)
Please send this to [place]

## 2011-03-22 NOTE — Telephone Encounter (Signed)
Pt is in office now [facility] is calling to get ov notes any labs and xray that was done here when she had pnumonia fax number is [phone]

## 2011-03-22 NOTE — Patient Instructions (Addendum)
Begin Prednisone 40mg  daily for 5 days then 20mg  daily and hold at this dose Mucinex DM Twice daily  As needed  Cough/congestion .  Finish Avelox  Hydromet 1-2 tsp every 12hrs as needed.  Begin Symbicort 160/4.25mcg 2 puffs Twice daily  Until sample is gone.  Please contact office for sooner follow up if symptoms do not improve or worsen or seek emergency care  follow up Dr. Delton Coombes  In 2 weeks and As needed

## 2011-03-22 NOTE — Progress Notes (Signed)
Subjective:    Patient ID: Penny Curry, female    DOB: October 08, 1940, 71 y.o.   MRN: [id number]  HPI 71 yo woman, former tobacco, recently dx w DM. Referred by Dr Leonor Liv for dyspnea and hx ILD followed by Dr DeCoy in [place], initially received dx of IPF. Underwent FOB and Bx in 2005, treated with pred and cytoxan in the past. Then had nodules on arm that were bx and showed probable sarcoidosis Summer 2011 ([place]). Last CT scan was prob 11/2009 (she isn't sure). 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   She has had an ONO that showed she needs O2 at night. She wears 3L/min at bedtime. She had PSG last summer [facility] - said she does not have OSA but needs O2.   ROV 05/01/10 -- f/u sarcoidosis, assoc ILD and PAH. TTE since last visit PASp ~ 36 mmHg. Saw Dr Leonor Liv about 10 days ago, has had about 5 weeks cough, congestion, low grade fever. Treated 2 rounds abx, most recently avelox by Dr Leonor Liv. No prednisone. Feels like she is improving, but still limited by SOB. No PFT on record, not currently on BDs.   ROV 06/23/10 -- sarcoidosis, assoc ILD, PAH. 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 Pseudo-normalization), decreased diffusion that corrects for Va. Has been feeling fairly well, was seen for abd pain, treated w cipro for ? Diverticulitis or UTI. Breathing has been fair, she believes that the heat and pollen make her worse. Not currently on allergy regimen. Has had some typical sarcoid rash on arms, has resolved (march 12).   ROV 01/05/11 -- 71 yo woman, sarcoidosis, assoc ILD, PAH. PFT 06/2010 = Moderate mixed disease (FEV1 1.63L, 75%). Regular f/u visit. Tells me that she is having more exertional SOB over last 2 months. Very little cough. She does hear some wheeze occasionally w exertion.  She has been having scratchy throat, some difficulty swallowing with choking - has been happening for a long time, but worse last 2 months.  She is on loratadine and  omeprazole.   ROV 01/25/11 -- 71 yo woman, sarcoidosis, assoc ILD, PAH. PFT 06/2010 = Moderate mixed disease (FEV1 1.63L, 75%). She returns after having her swallowing eval and her CT chest. Need to get the CT scans from [place] [facility] to compare. She returns with more cough, green mucous, nasal gtt and congestion.   03/22/2011 Acute OV  Returns for persistent cough and congestion . Been seen at PCP and urgent care x 4  over last month at Urgent care for slow to resolve PNA . Currently on Avelox day 8/10. Finished prednisone 3 weeks ago without much help. She has been on 4 different abx including levaquin and avelox .  Still has cough and congestion with thick green mucus. Has some sinus pain and pressure with drainage. No vomitting or edema. No chest pain .  Low grade fevers on/off.  Cough is wearing her out. Take tussionex with some help but does not last. Too expensive.        Review of Systems Constitutional:   No  weight loss, night sweats,  Fevers, chills,  +fatigue, or  lassitude.  HEENT:   No headaches,  Difficulty swallowing,  Tooth/dental problems, or  Sore throat,                No sneezing, itching, ear ache,  +nasal congestion, post nasal drip,   CV:  No chest  pain,  Orthopnea, PND, swelling in lower extremities, anasarca, dizziness, palpitations, syncope.   GI  No heartburn, indigestion, abdominal pain, nausea, vomiting, diarrhea, change in bowel habits, loss of appetite, bloody stools.   Resp:   No coughing up of blood.   No chest wall deformity  Skin: no rash or lesions.  GU: no dysuria, change in color of urine, no urgency or frequency.  No flank pain, no hematuria   MS:  No joint pain or swelling.  No decreased range of motion.  No back pain.  Psych:  No change in mood or affect. No depression or anxiety.  No memory loss.         Objective:   Physical Exam Gen: Pleasant, well-nourished, in no distress,  normal affect  ENT: No lesions,  mouth clear,   oropharynx clear, no postnasal drip  Neck: No JVD, no TMG, no carotid bruits  Lungs: No use of accessory muscles, no dullness to percussion, Coarse BS w/ no wheezing  Harsh barking cough  Cardiovascular: RRR, heart sounds normal, no murmur or gallops, no peripheral edema  Musculoskeletal: No deformities, no cyanosis or clubbing  Neuro: alert, non focal  Skin: Warm, no lesions or rashes    CT chest 01/09/11 --  Findings: No enlarged axillary or supraclavicular lymph nodes.  There is a right paratracheal lymph node which measures 0.9 cm,  image 19. AP window lymph node measures 0.7 cm. The subcarinal  lymph node measures 0.6 cm. No pericardial or pleural effusion.  Mild, lower lobe predominant peripheral interstitial reticulation  is noted.  No honeycombing identified. No significant bronchiectasis.  On the expiration images there is a mosaic attenuation pattern  throughout both lungs compatible with air trapping and small  airways disease.  Multifocal bilateral nodular densities are identified. The largest  is within the basilar portion of the left upper lobe measuring 1.5  cm and has spiculated margins.  Right upper lobe perihilar nodule measures 0.8 cm.  Ground-glass nodule is noted within the right upper lobe measuring  10 mm, image #9.  Review of the visualized osseous structures is unremarkable.  Limited imaging through the upper abdomen is negative.  IMPRESSION:  1. Interstitial, peripheral reticulation and multifocal nodular  densities are identified within both lungs. Findings are  consistent with the history of pulmonary sarcoid and interstitial  lung disease. Careful clinical correlation is recommended as the  differential consideration for the nodular opacities would include  metastatic disease.  2. No significant mediastinal or hilar adenopathy.   Assessment & Plan:

## 2011-03-22 NOTE — Assessment & Plan Note (Addendum)
Sarcoid flare complicated by underlying ILD and possible recurrent tracheobronchitis vs PNA  Plan:  Begin Prednisone 40mg  daily for 5 days then 20mg  daily and hold at this dose Mucinex DM Twice daily  As needed  Cough/congestion .  Finish Avelox  Hydromet 1-2 tsp every 12hrs as needed.  Begin Symbicort 160/4.47mcg 2 puffs Twice daily  Until sample is gone.  Please contact office for sooner follow up if symptoms do not improve or worsen or seek emergency care  follow up Dr. Delton Coombes  In 2 weeks and As needed

## 2011-03-28 NOTE — Telephone Encounter (Signed)
Done

## 2011-04-11 ENCOUNTER — Ambulatory Visit: Payer: Medicare Other | Admitting: Adult Health

## 2011-04-12 ENCOUNTER — Ambulatory Visit: Payer: Medicare Other | Admitting: Adult Health

## 2011-04-16 ENCOUNTER — Ambulatory Visit (INDEPENDENT_AMBULATORY_CARE_PROVIDER_SITE_OTHER): Payer: Medicare Other | Admitting: Adult Health

## 2011-04-16 ENCOUNTER — Encounter: Payer: Self-pay | Admitting: Adult Health

## 2011-04-16 VITALS — BP 118/76 | HR 98 | Temp 98.5°F | Ht 65.0 in | Wt 197.0 lb

## 2011-04-16 DIAGNOSIS — D869 Sarcoidosis, unspecified: Secondary | ICD-10-CM

## 2011-04-16 DIAGNOSIS — R05 Cough: Secondary | ICD-10-CM

## 2011-04-16 DIAGNOSIS — R059 Cough, unspecified: Secondary | ICD-10-CM

## 2011-04-16 MED ORDER — GUAIFENESIN-CODEINE 100-10 MG/5ML PO SYRP: 5.0000 mL | ORAL_SOLUTION | Freq: Four times a day (QID) | ORAL | Status: DC | PRN

## 2011-04-16 MED ORDER — LEVOFLOXACIN 500 MG PO TABS: 500.0000 mg | ORAL_TABLET | Freq: Every day | ORAL | Status: AC

## 2011-04-16 NOTE — Patient Instructions (Addendum)
Begin  Levaquin 500mg  daily for 10 days  We are setting you up for a CT sinuses . I will call with results.  Mucinex DM Twice daily  As needed  Cough/congestion .  Saline nasal rinses Twice daily   Guaifenesin w/ codeine cough syrup- 1-2 tsp every 4-6 hr As needed  Cough  Add Pepcid 20 mg At bedtime   Remain on prednisone 20mg  daily  Please contact office for sooner follow up if symptoms do not improve or worsen or seek emergency care  follow up Dr. Delton Coombes  In 2 weeks and As needed

## 2011-04-16 NOTE — Assessment & Plan Note (Signed)
Flare with associated slow to resolve bronchitis  ? Underlying sinusitis  Will set up for CT sinus   Plan:  Begin  Levaquin 500mg  daily for 10 days  We are setting you up for a CT sinuses . I will call with results.  Mucinex DM Twice daily  As needed  Cough/congestion .  Saline nasal rinses Twice daily   Guaifenesin w/ codeine cough syrup- 1-2 tsp every 4-6 hr As needed  Cough  Add Pepcid 20 mg At bedtime   Remain on prednisone 20mg  daily  Please contact office for sooner follow up if symptoms do not improve or worsen or seek emergency care  follow up Dr. Delton Coombes  In 2 weeks and As needed

## 2011-04-16 NOTE — Progress Notes (Signed)
Subjective:    Patient ID: Penny Curry, female    DOB: Sep 02, 1940, 71 y.o.   MRN: [id number]  HPI 71 yo woman, former tobacco, recently dx w DM. Referred by Dr Leonor Liv for dyspnea and hx ILD followed by Dr DeCoy in [place], initially received dx of IPF. Underwent FOB and Bx in 2005, treated with pred and cytoxan in the past. Then had nodules on arm that were bx and showed probable sarcoidosis Summer 2011 ([place]). Last CT scan was prob 11/2009 (she isn't sure). 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   She has had an ONO that showed she needs O2 at night. She wears 3L/min at bedtime. She had PSG last summer [facility] - said she does not have OSA but needs O2.   ROV 05/01/10 -- f/u sarcoidosis, assoc ILD and PAH. TTE since last visit PASp ~ 36 mmHg. Saw Dr Leonor Liv about 10 days ago, has had about 5 weeks cough, congestion, low grade fever. Treated 2 rounds abx, most recently avelox by Dr Leonor Liv. No prednisone. Feels like she is improving, but still limited by SOB. No PFT on record, not currently on BDs.   ROV 06/23/10 -- sarcoidosis, assoc ILD, PAH. 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 Pseudo-normalization), decreased diffusion that corrects for Va. Has been feeling fairly well, was seen for abd pain, treated w cipro for ? Diverticulitis or UTI. Breathing has been fair, she believes that the heat and pollen make her worse. Not currently on allergy regimen. Has had some typical sarcoid rash on arms, has resolved (march 12).   ROV 01/05/11 -- 71 yo woman, sarcoidosis, assoc ILD, PAH. PFT 06/2010 = Moderate mixed disease (FEV1 1.63L, 75%). Regular f/u visit. Tells me that she is having more exertional SOB over last 2 months. Very little cough. She does hear some wheeze occasionally w exertion.  She has been having scratchy throat, some difficulty swallowing with choking - has been happening for a long time, but worse last 2 months.  She is on loratadine and  omeprazole.   ROV 01/25/11 -- 71 yo woman, sarcoidosis, assoc ILD, PAH. PFT 06/2010 = Moderate mixed disease (FEV1 1.63L, 75%). She returns after having her swallowing eval and her CT chest. Need to get the CT scans from [place] [facility] to compare. She returns with more cough, green mucous, nasal gtt and congestion.   03/22/2011 Acute OV  Returns for persistent cough and congestion . Been seen at PCP and urgent care x 4  over last month at Urgent care for slow to resolve PNA . Currently on Avelox day 8/10. Finished prednisone 3 weeks ago without much help. She has been on 4 different abx including levaquin and avelox .  Still has cough and congestion with thick green mucus. Has some sinus pain and pressure with drainage. No vomitting or edema. No chest pain .  Low grade fevers on/off.  Cough is wearing her out. Take tussionex with some help but does not last. Too expensive.  >>prednisone burst   04/16/2011 Acute OV  Complains of persistent cough and congestion. Worse cough for last 1 week with green mucus, wheezing, increased SOB, tightness in chest, hoarseness, low grade temp x 1 week. Seen 1 month ago for similar symptoms. CXR report from urgent care with no acute process, tx w/ steroid burst, sample of symbicort and Avelox x 7 d . Had minimal improvement and now cough is getting worse.  Has nasal congestion and drainage. Was not able to get hydromet cough syrup due to cough. Cough is aggravating and keeping her up at night. No hemopytsis or chest pain . No n/v/d. No new meds. Currently on prednisone 20mg  daily       Review of Systems Constitutional:   No  weight loss, night sweats,  Fevers, chills,  +fatigue, or  lassitude.  HEENT:   No headaches,  Difficulty swallowing,  Tooth/dental problems, or  Sore throat,                No sneezing, itching, ear ache,  +nasal congestion, post nasal drip,   CV:  No chest pain,  Orthopnea, PND, swelling in lower extremities, anasarca, dizziness, palpitations,  syncope.   GI  No heartburn, indigestion, abdominal pain, nausea, vomiting, diarrhea, change in bowel habits, loss of appetite, bloody stools.   Resp:   No coughing up of blood.   No chest wall deformity  Skin: no rash or lesions.  GU: no dysuria, change in color of urine, no urgency or frequency.  No flank pain, no hematuria   MS:  No joint pain or swelling.  No decreased range of motion.  No back pain.  Psych:  No change in mood or affect. No depression or anxiety.  No memory loss.         Objective:   Physical Exam Gen: Pleasant, well-nourished, in no distress,  normal affect  ENT: No lesions,  mouth clear,  oropharynx clear, no postnasal drip  Neck: No JVD, no TMG, no carotid bruits  Lungs: No use of accessory muscles, no dullness to percussion, Coarse BS w/ no wheezing  Harsh barking cough  Cardiovascular: RRR, heart sounds normal, no murmur or gallops, no peripheral edema  Musculoskeletal: No deformities, no cyanosis or clubbing  Neuro: alert, non focal  Skin: Warm, no lesions or rashes    CT chest 01/09/11 --  Findings: No enlarged axillary or supraclavicular lymph nodes.  There is a right paratracheal lymph node which measures 0.9 cm,  image 19. AP window lymph node measures 0.7 cm. The subcarinal  lymph node measures 0.6 cm. No pericardial or pleural effusion.  Mild, lower lobe predominant peripheral interstitial reticulation  is noted.  No honeycombing identified. No significant bronchiectasis.  On the expiration images there is a mosaic attenuation pattern  throughout both lungs compatible with air trapping and small  airways disease.  Multifocal bilateral nodular densities are identified. The largest  is within the basilar portion of the left upper lobe measuring 1.5  cm and has spiculated margins.  Right upper lobe perihilar nodule measures 0.8 cm.  Ground-glass nodule is noted within the right upper lobe measuring  10 mm, image #9.  Review of the  visualized osseous structures is unremarkable.  Limited imaging through the upper abdomen is negative.  IMPRESSION:  1. Interstitial, peripheral reticulation and multifocal nodular  densities are identified within both lungs. Findings are  consistent with the history of pulmonary sarcoid and interstitial  lung disease. Careful clinical correlation is recommended as the  differential consideration for the nodular opacities would include  metastatic disease.  2. No significant mediastinal or hilar adenopathy.   Assessment & Plan:

## 2011-04-18 ENCOUNTER — Ambulatory Visit (INDEPENDENT_AMBULATORY_CARE_PROVIDER_SITE_OTHER)
Admission: RE | Admit: 2011-04-18 | Discharge: 2011-04-18 | Disposition: A | Discharge status: Home / Self Care | Payer: Medicare Other | Source: Ambulatory Visit | Attending: Cardiovascular Disease | Admitting: Cardiovascular Disease

## 2011-04-18 DIAGNOSIS — R05 Cough: Secondary | ICD-10-CM

## 2011-04-19 ENCOUNTER — Other Ambulatory Visit: Payer: Medicare Other

## 2011-04-19 ENCOUNTER — Encounter: Payer: Self-pay | Admitting: Adult Health

## 2011-04-19 DIAGNOSIS — J329 Chronic sinusitis, unspecified: Secondary | ICD-10-CM | POA: Insufficient documentation

## 2011-04-30 ENCOUNTER — Ambulatory Visit (INDEPENDENT_AMBULATORY_CARE_PROVIDER_SITE_OTHER): Payer: Medicare Other | Admitting: Adult Health

## 2011-04-30 ENCOUNTER — Encounter: Payer: Self-pay | Admitting: Adult Health

## 2011-04-30 VITALS — BP 142/74 | HR 102 | Temp 97.3°F | Ht 65.0 in | Wt 204.8 lb

## 2011-04-30 DIAGNOSIS — D869 Sarcoidosis, unspecified: Secondary | ICD-10-CM

## 2011-04-30 MED ORDER — PREDNISONE 10 MG PO TABS: ORAL_TABLET | ORAL | Status: DC

## 2011-04-30 MED ORDER — BUDESONIDE-FORMOTEROL FUMARATE 160-4.5 MCG/ACT IN AERO: 2.0000 | INHALATION_SPRAY | Freq: Two times a day (BID) | RESPIRATORY_TRACT | Status: DC

## 2011-04-30 MED ORDER — GUAIFENESIN-CODEINE 100-10 MG/5ML PO SYRP: 5.0000 mL | ORAL_SOLUTION | Freq: Four times a day (QID) | ORAL | Status: AC | PRN

## 2011-04-30 NOTE — Progress Notes (Signed)
Subjective:    Patient ID: Penny Curry, female    DOB: June 17, 1940, 71 y.o.   MRN: [id number]  HPI 71 yo woman, former tobacco, recently dx w DM. Referred by Dr Leonor Liv for dyspnea and hx ILD followed by Dr DeCoy in [place], initially received dx of IPF. Underwent FOB and Bx in 2005, treated with pred and cytoxan in the past. Then had nodules on arm that were bx and showed probable sarcoidosis Summer 2011 ([place]). Last CT scan was prob 11/2009 (she isn't sure). 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   She has had an ONO that showed she needs O2 at night. She wears 3L/min at bedtime. She had PSG last summer [facility] - said she does not have OSA but needs O2.   ROV 05/01/10 -- f/u sarcoidosis, assoc ILD and PAH. TTE since last visit PASp ~ 36 mmHg. Saw Dr Leonor Liv about 10 days ago, has had about 5 weeks cough, congestion, low grade fever. Treated 2 rounds abx, most recently avelox by Dr Leonor Liv. No prednisone. Feels like she is improving, but still limited by SOB. No PFT on record, not currently on BDs.   ROV 06/23/10 -- sarcoidosis, assoc ILD, PAH. 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 Pseudo-normalization), decreased diffusion that corrects for Va. Has been feeling fairly well, was seen for abd pain, treated w cipro for ? Diverticulitis or UTI. Breathing has been fair, she believes that the heat and pollen make her worse. Not currently on allergy regimen. Has had some typical sarcoid rash on arms, has resolved (march 12).   ROV 01/05/11 -- 71 yo woman, sarcoidosis, assoc ILD, PAH. PFT 06/2010 = Moderate mixed disease (FEV1 1.63L, 75%). Regular f/u visit. Tells me that she is having more exertional SOB over last 2 months. Very little cough. She does hear some wheeze occasionally w exertion.  She has been having scratchy throat, some difficulty swallowing with choking - has been happening for a long time, but worse last 2 months.  She is on loratadine and  omeprazole.   ROV 01/25/11 -- 71 yo woman, sarcoidosis, assoc ILD, PAH. PFT 06/2010 = Moderate mixed disease (FEV1 1.63L, 75%). She returns after having her swallowing eval and her CT chest. Need to get the CT scans from [place] [facility] to compare. She returns with more cough, green mucous, nasal gtt and congestion.   03/22/2011 Acute OV  Returns for persistent cough and congestion . Been seen at PCP and urgent care x 4  over last month at Urgent care for slow to resolve PNA . Currently on Avelox day 8/10. Finished prednisone 3 weeks ago without much help. She has been on 4 different abx including levaquin and avelox .  Still has cough and congestion with thick green mucus. Has some sinus pain and pressure with drainage. No vomitting or edema. No chest pain .  Low grade fevers on/off.  Cough is wearing her out. Take tussionex with some help but does not last. Too expensive.  >>prednisone burst   04/16/2011 Acute OV  Complains of persistent cough and congestion. Worse cough for last 1 week with green mucus, wheezing, increased SOB, tightness in chest, hoarseness, low grade temp x 1 week. Seen 1 month ago for similar symptoms. CXR report from urgent care with no acute process, tx w/ steroid burst, sample of symbicort and Avelox x 7 d . Had minimal improvement and now cough is getting worse.  Has nasal congestion and drainage. Was not able to get hydromet cough syrup due to cough. Cough is aggravating and keeping her up at night. No hemopytsis or chest pain . No n/v/d. No new meds. Currently on prednisone 20mg  daily  >>Levaquin x 10 d , CT sinus +   04/30/2011 Acute OV  Complains of persistent symptoms of cough and congestion . Complains of cough with yellow, sob, wheezing, chest pain, and fever up to 101. States was in the ED last night for  Bronchitis. Has been on several abx for last 2 months . Seen  2 weeks ago with Sarcoid flare /bronchitis and sinusitis . CT sinus showed Paranasal sinus mucosal  thickening/opacification most notable involving the maxillary sinuses (greater on the left) and right ethmoid sinus air cells.she was tx with Levaquin x 10 days and prednisone held at 20mg  daily . However she tapered off steroids -misunderstood directions.  She feels her cough never got better Was seen in ER at [facility] last night, told she had bronchitis and to. follow up in our office. She Was not given any rx. Told xray was neg for PNA. Records are not available.  She does have fever on/off. Tmax 101. Recent labs with no elevated wbc per pt/daughter.  Did have desats in ER to 87%. Wears O2 at sleep only.       Review of Systems Constitutional:   No  weight loss, night sweats, +  Fevers, chills,  +fatigue, or  lassitude.  HEENT:   No headaches,  Difficulty swallowing,  Tooth/dental problems, or  Sore throat,                No sneezing, itching, ear ache,  +nasal congestion, post nasal drip,   CV:  No chest pain,  Orthopnea, PND, swelling in lower extremities, anasarca, dizziness, palpitations, syncope.   GI  No heartburn, indigestion, abdominal pain, nausea, vomiting, diarrhea, change in bowel habits, loss of appetite, bloody stools.   Resp:   No coughing up of blood.   No chest wall deformity  Skin: no rash or lesions.  GU: no dysuria, change in color of urine, no urgency or frequency.  No flank pain, no hematuria   MS:  No joint pain or swelling.  No decreased range of motion.  No back pain.  Psych:  No change in mood or affect. No depression or anxiety.  No memory loss.         Objective:   Physical Exam Gen: Pleasant, well-nourished, in no distress,  normal affect  ENT: No lesions,  mouth clear,  oropharynx clear, no postnasal drip  Neck: No JVD, no TMG, no carotid bruits  Lungs: No use of accessory muscles, no dullness to percussion, Coarse BS w/ no wheezing  Harsh barking cough  Cardiovascular: RRR, heart sounds normal, no murmur or gallops, no peripheral  edema  Musculoskeletal: No deformities, no cyanosis or clubbing  Neuro: alert, non focal  Skin: Warm, no lesions or rashes    CT chest 01/09/11 --  Findings: No enlarged axillary or supraclavicular lymph nodes.  There is a right paratracheal lymph node which measures 0.9 cm,  image 19. AP window lymph node measures 0.7 cm. The subcarinal  lymph node measures 0.6 cm. No pericardial or pleural effusion.  Mild, lower lobe predominant peripheral interstitial reticulation  is noted.  No honeycombing identified. No significant bronchiectasis.  On the expiration images there is a mosaic attenuation pattern  throughout both lungs compatible with air trapping and  small  airways disease.  Multifocal bilateral nodular densities are identified. The largest  is within the basilar portion of the left upper lobe measuring 1.5  cm and has spiculated margins.  Right upper lobe perihilar nodule measures 0.8 cm.  Ground-glass nodule is noted within the right upper lobe measuring  10 mm, image #9.  Review of the visualized osseous structures is unremarkable.  Limited imaging through the upper abdomen is negative.  IMPRESSION:  1. Interstitial, peripheral reticulation and multifocal nodular  densities are identified within both lungs. Findings are  consistent with the history of pulmonary sarcoid and interstitial  lung disease. Careful clinical correlation is recommended as the  differential consideration for the nodular opacities would include  metastatic disease.  2. No significant mediastinal or hilar adenopathy.   Assessment & Plan:

## 2011-04-30 NOTE — Patient Instructions (Addendum)
Begin Prednisone 40mg  daily for 5 days then 20mg  daily and hold at this dose Mucinex DM Twice daily  As needed  Cough/congestion .  Continue on Symbicort 160/4.12mcg 2 puffs Twice daily -brush/rinse and gargle after use.  Saline nasal rinses Twice daily   Guaifenesin w/ codeine cough syrup- 1-2 tsp every 4-6 hr As needed  Cough  Continue on  Pepcid 20 mg At bedtime   Please contact office for sooner follow up if symptoms do not improve or worsen or seek emergency care  follow up Dr. Delton Coombes  In 4 days as planned and As needed   Tylenol and fluids .  Wear oxygen At bedtime  And with activity.

## 2011-04-30 NOTE — Assessment & Plan Note (Signed)
Flare with associated Bronchitis and Sinusitis with multiple abx and steroid tapers Workup has showed no acute process on Xray.  CT sinus with +sinusitis-tx w/ 10 d of levaquin (has been on 5 abx over last 2 months)  May need ENT referral if not improving with recs below.   Plan;  Begin Prednisone 40mg  daily for 5 days then 20mg  daily and hold at this dose Mucinex DM Twice daily  As needed  Cough/congestion .  Continue on Symbicort 160/4.91mcg 2 puffs Twice daily -brush/rinse and gargle after use.  Saline nasal rinses Twice daily   Guaifenesin w/ codeine cough syrup- 1-2 tsp every 4-6 hr As needed  Cough  Continue on  Pepcid 20 mg At bedtime   Please contact office for sooner follow up if symptoms do not improve or worsen or seek emergency care  follow up Dr. Delton Coombes  In 4 days as planned and As needed   Tylenol and fluids .  Wear oxygen At bedtime  And with activity.

## 2011-05-04 ENCOUNTER — Ambulatory Visit (INDEPENDENT_AMBULATORY_CARE_PROVIDER_SITE_OTHER): Payer: Medicare Other | Admitting: Emergency Medicine

## 2011-05-04 ENCOUNTER — Encounter: Payer: Self-pay | Admitting: Emergency Medicine

## 2011-05-04 VITALS — BP 142/72 | HR 104 | Temp 98.1°F | Ht 65.0 in | Wt 203.2 lb

## 2011-05-04 DIAGNOSIS — J329 Chronic sinusitis, unspecified: Secondary | ICD-10-CM

## 2011-05-04 DIAGNOSIS — D869 Sarcoidosis, unspecified: Secondary | ICD-10-CM

## 2011-05-04 MED ORDER — CLINDAMYCIN HCL 300 MG PO CAPS: 300.0000 mg | ORAL_CAPSULE | Freq: Four times a day (QID) | ORAL | Status: AC

## 2011-05-04 NOTE — Progress Notes (Signed)
Subjective:    Patient ID: Penny Curry, female    DOB: November 19, 1940, 71 y.o.   MRN: [id number]  HPI 71 yo woman, former tobacco, recently dx w DM. Referred by Dr Leonor Liv for dyspnea and hx ILD followed by Dr DeCoy in [place], initially received dx of IPF. Underwent FOB and Bx in 2005, treated with pred and cytoxan in the past. Then had nodules on arm that were bx and showed probable sarcoidosis Summer 2011 ([place]). Last CT scan was prob 11/2009 (she isn't sure). 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   She has had an ONO that showed she needs O2 at night. She wears 3L/min at bedtime. She had PSG last summer [facility] - said she does not have OSA but needs O2.   ROV 05/01/10 -- f/u sarcoidosis, assoc ILD and PAH. TTE since last visit PASp ~ 36 mmHg. Saw Dr Leonor Liv about 10 days ago, has had about 5 weeks cough, congestion, low grade fever. Treated 2 rounds abx, most recently avelox by Dr Leonor Liv. No prednisone. Feels like she is improving, but still limited by SOB. No PFT on record, not currently on BDs.   ROV 06/23/10 -- sarcoidosis, assoc ILD, PAH. 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 Pseudo-normalization), decreased diffusion that corrects for Va. Has been feeling fairly well, was seen for abd pain, treated w cipro for ? Diverticulitis or UTI. Breathing has been fair, she believes that the heat and pollen make her worse. Not currently on allergy regimen. Has had some typical sarcoid rash on arms, has resolved (march 12).   ROV 01/05/11 -- 71 yo woman, sarcoidosis, assoc ILD, PAH. PFT 06/2010 = Moderate mixed disease (FEV1 1.63L, 75%). Regular f/u visit. Tells me that she is having more exertional SOB over last 2 months. Very little cough. She does hear some wheeze occasionally w exertion.  She has been having scratchy throat, some difficulty swallowing with choking - has been happening for a long time, but worse last 2 months.  She is on loratadine and  omeprazole.   ROV 01/25/11 -- 71 yo woman, sarcoidosis, assoc ILD, PAH. PFT 06/2010 = Moderate mixed disease (FEV1 1.63L, 75%). She returns after having her swallowing eval and her CT chest. Need to get the CT scans from [place] [facility] to compare. She returns with more cough, green mucous, nasal gtt and congestion.   03/22/2011 Acute OV  Returns for persistent cough and congestion . Been seen at PCP and urgent care x 4  over last month at Urgent care for slow to resolve PNA . Currently on Avelox day 8/10. Finished prednisone 3 weeks ago without much help. She has been on 4 different abx including levaquin and avelox .  Still has cough and congestion with thick green mucus. Has some sinus pain and pressure with drainage. No vomitting or edema. No chest pain .  Low grade fevers on/off.  Cough is wearing her out. Take tussionex with some help but does not last. Too expensive.  >>prednisone burst   04/16/2011 Acute OV  Complains of persistent cough and congestion. Worse cough for last 1 week with green mucus, wheezing, increased SOB, tightness in chest, hoarseness, low grade temp x 1 week. Seen 1 month ago for similar symptoms. CXR report from urgent care with no acute process, tx w/ steroid burst, sample of symbicort and Avelox x 7 d . Had minimal improvement and now cough is getting worse.  Has nasal congestion and drainage. Was not able to get hydromet cough syrup due to cough. Cough is aggravating and keeping her up at night. No hemopytsis or chest pain . No n/v/d. No new meds. Currently on prednisone 20mg  daily  >>Levaquin x 10 d , CT sinus +   04/30/2011 Acute OV  Complains of persistent symptoms of cough and congestion . Complains of cough with yellow, sob, wheezing, chest pain, and fever up to 101. States was in the ED last night for  Bronchitis. Has been on several abx for last 2 months . Seen  2 weeks ago with Sarcoid flare /bronchitis and sinusitis . CT sinus showed Paranasal sinus mucosal  thickening/opacification most notable involving the maxillary sinuses (greater on the left) and right ethmoid sinus air cells.she was tx with Levaquin x 10 days and prednisone held at 20mg  daily . However she tapered off steroids -misunderstood directions.  She feels her cough never got better Was seen in ER at [facility] last night, told she had bronchitis and to. follow up in our office. She Was not given any rx. Told xray was neg for PNA. Records are not available.  She does have fever on/off. Tmax 101. Recent labs with no elevated wbc per pt/daughter.  Did have desats in ER to 87%. Wears O2 at sleep only.   ROV 05/04/11 -- 71 yo woman, sarcoidosis, assoc ILD, PAH. PFT 06/2010 = Moderate mixed disease (FEV1 1.63L, 75%). On omeprazole/lansoprazole, loratadine. Has been seen monthly as above for continued nasal congestion and cough - multiple rounds abx and pred with no lasting effect, tapered down to 20mg , now back up to 40mg . She is on Symbicort - restarted ??. Her sinus CT scan shows chronic sinusitis. She has been having low grade fevers. Blows yellow green out of her nose.      Objective:   Physical Exam Gen: Pleasant, well-nourished, in no distress,  normal affect  ENT: No lesions,  mouth clear,  oropharynx clear, no postnasal drip  Neck: No JVD, no TMG, no carotid bruits  Lungs: No use of accessory muscles, no dullness to percussion, Coarse BS w/ no wheezing  Harsh barking cough  Cardiovascular: RRR, heart sounds normal, no murmur or gallops, no peripheral edema  Musculoskeletal: No deformities, no cyanosis or clubbing  Neuro: alert, non focal  Skin: Warm, no lesions or rashes    CT chest 01/09/11 --  Findings: No enlarged axillary or supraclavicular lymph nodes.  There is a right paratracheal lymph node which measures 0.9 cm,  image 19. AP window lymph node measures 0.7 cm. The subcarinal  lymph node measures 0.6 cm. No pericardial or pleural effusion.  Mild, lower lobe  predominant peripheral interstitial reticulation  is noted.  No honeycombing identified. No significant bronchiectasis.  On the expiration images there is a mosaic attenuation pattern  throughout both lungs compatible with air trapping and small  airways disease.  Multifocal bilateral nodular densities are identified. The largest  is within the basilar portion of the left upper lobe measuring 1.5  cm and has spiculated margins.  Right upper lobe perihilar nodule measures 0.8 cm.  Ground-glass nodule is noted within the right upper lobe measuring  10 mm, image #9.  Review of the visualized osseous structures is unremarkable.  Limited imaging through the upper abdomen is negative.  IMPRESSION:  1. Interstitial, peripheral reticulation and multifocal nodular  densities are identified within both lungs. Findings are  consistent with the history of pulmonary sarcoid and interstitial  lung disease. Careful clinical correlation is recommended as the  differential consideration for the nodular opacities would include  metastatic disease.  2. No significant mediastinal or hilar adenopathy.   Assessment & Plan:   Sinusitis Chronic sinusitis on CT scan. Will treat with clinda x 6 weeks, consider referral to ENT for furher eval. Need to make sure she is doing [place], loratadine. Continue her PPI's as she is taking them   PULMONARY SARCOIDOSIS Wean prednisone to 20mg  qd and then 20mg  x 3 days and then 10mg  x 3 days then stop

## 2011-05-04 NOTE — Patient Instructions (Signed)
Please start clindamycin 300mg  4 times a day for the next 6 weeks Decrease your prednisone to 20mg  for 3 days, then 10mg  for 3 days, then stop Continue your nasal saline washes, loratadine Continue your Prevacid and omeprazole as you are taking them  Follow with Dr Delton Coombes in 6 weeks

## 2011-05-04 NOTE — Assessment & Plan Note (Signed)
Wean prednisone to 20mg  qd and then 20mg  x 3 days and then 10mg  x 3 days then stop

## 2011-05-04 NOTE — Assessment & Plan Note (Signed)
Chronic sinusitis on CT scan. Will treat with clinda x 6 weeks, consider referral to ENT for furher eval. Need to make sure she is doing [place], loratadine. Continue her PPI's as she is taking them

## 2011-05-10 ENCOUNTER — Telehealth: Payer: Self-pay | Admitting: Emergency Medicine

## 2011-05-10 NOTE — Telephone Encounter (Signed)
Called and spoke with pt.  Informed her of CY's response/ recs.  Pt states she is already taking the prevacid and omeprazole approx 30 mins before meals but will try adding the Gaviscon tid between meals to see if that helps.  Pt is aware to call back if symptoms do not improve or if they worsen

## 2011-05-10 NOTE — Telephone Encounter (Signed)
Make sure the prevacid once daily and omeprazole twice daily are being taken before meals.  Then add Gaviscon coating liquid antacid 3 times daily, bewtween meals and especially at bedtime.   Try that through the weekend, then let Dr Delton Coombes know if you are still having trouble next week.

## 2011-05-10 NOTE — Telephone Encounter (Signed)
Sinusitis - Leslye Peer., MD 05/04/2011 5:03 PM Signed  Chronic sinusitis on CT scan. Will treat with clinda x 6 weeks, consider referral to ENT for furher eval. Need to make sure she is doing [place], loratadine. Continue her PPI's as she is taking them     Pt c/o severe acid reflux on clindamycin (see AVS instructions from 05/04/11) and states that the Prevacid and Omeprazole are not helping. She denies any stomach pain or diarrhea and says she is taking the abx with food. Dr. Delton Coombes is 11 to 7 Elink and we will need to send msg to doc of the day for recs, Dr. Maple Hudson. Pls advise. Allergies  Allergen Reactions  . Penicillins     REACTION: rash, high fever

## 2011-05-17 ENCOUNTER — Telehealth: Payer: Self-pay | Admitting: Emergency Medicine

## 2011-05-17 ENCOUNTER — Ambulatory Visit: Payer: Medicare Other | Admitting: Internal Medicine

## 2011-05-17 NOTE — Telephone Encounter (Signed)
Pt still no better. Cough is still productive with yellow/green mucus. She has increased sob at times. Has been on Clindamycin for 2 weeks and things are not improving. Pt aware RB is out of the office this week and will need to come in for Ov. Pt to see Dr. Marchelle Gearing this afternoon at 4pm.

## 2011-05-22 ENCOUNTER — Telehealth: Payer: Self-pay | Admitting: Critical Care Medicine

## 2011-05-22 ENCOUNTER — Telehealth: Payer: Self-pay | Admitting: Emergency Medicine

## 2011-05-22 ENCOUNTER — Ambulatory Visit: Payer: Medicare Other | Admitting: Critical Care Medicine

## 2011-05-22 NOTE — Telephone Encounter (Signed)
I spoke with pt and she states she had to go to the ED on Saturday bc the clindamycin caused her to break out, had difficulty breathing, and was very itchy. She states she was given medicine through IV and told to take benadryl. Pt states she is still having the same symptoms from when she came in and saw RB on 05/07/11. Pt is scheduled to come in and see PW today at 1:45 to be evaluated. Nothing further was needed

## 2011-05-22 NOTE — Telephone Encounter (Signed)
Error.  Duplicate message.  Holly D Pryor °- °

## 2011-05-22 NOTE — Telephone Encounter (Signed)
Ov with MW at 9 am tomorrow. Advised seek emergency care sooner if needed.

## 2011-05-23 ENCOUNTER — Encounter: Payer: Self-pay | Admitting: Internal Medicine

## 2011-05-23 ENCOUNTER — Ambulatory Visit (INDEPENDENT_AMBULATORY_CARE_PROVIDER_SITE_OTHER): Payer: Medicare Other | Admitting: Internal Medicine

## 2011-05-23 VITALS — BP 128/62 | HR 109 | Temp 97.7°F | Ht 65.0 in | Wt 192.8 lb

## 2011-05-23 DIAGNOSIS — R05 Cough: Secondary | ICD-10-CM

## 2011-05-23 DIAGNOSIS — J45909 Unspecified asthma, uncomplicated: Secondary | ICD-10-CM

## 2011-05-23 MED ORDER — TRAMADOL HCL 50 MG PO TABS: 50.0000 mg | ORAL_TABLET | Freq: Four times a day (QID) | ORAL | Status: DC | PRN

## 2011-05-23 NOTE — Patient Instructions (Addendum)
GERD (REFLUX)  is an extremely common cause of respiratory symptoms, many times with no significant heartburn at all.    It can be treated with medication, but also with lifestyle changes including avoidance of late meals, excessive alcohol, smoking cessation, and avoid fatty foods, chocolate, peppermint, colas, red wine, and acidic juices such as orange juice.  NO MINT OR MENTHOL PRODUCTS SO NO COUGH DROPS  USE SUGARLESS CANDY INSTEAD (jolley ranchers or Stover's)  NO OIL BASED VITAMINS - use powdered substitutes.    Prilosec 20 mg(= prevacid 15 x 2)  Take 30- 60 min before your first and last meals of the day   Zantac 150 mg at bedtime ( 2 x 75)   For cough try mucinex dm 1200 mg every 12 hours and if still coughing add tramadol 50 mg 1-2  every 4 hours   Only use symbicort if your breathing deteriorates off it as it may be contributing to hoarsness and cough  Work on inhaler technique:  relax and gently blow all the way out then take a nice smooth deep breath back in, triggering the inhaler at same time you start breathing in.  Hold for up to 5 seconds if you can.  Rinse and gargle with water when done   If your mouth or throat starts to bother you,   I suggest you time the inhaler to your dental care and after using the inhaler(s) brush teeth and tongue with a baking soda containing toothpaste and when you rinse this out, gargle with it first to see if this helps your mouth and throat.     Follow up with [place] or Tammy NP w/in 2 weeks sooner if needed

## 2011-05-23 NOTE — Assessment & Plan Note (Signed)
The most common causes of chronic cough in immunocompetent adults include the following: upper airway cough syndrome (UACS), previously referred to as postnasal drip syndrome (PNDS), which is caused by variety of rhinosinus conditions; (2) asthma; (3) GERD; (4) chronic bronchitis from cigarette smoking or other inhaled environmental irritants; (5) nonasthmatic eosinophilic bronchitis; and (6) bronchiectasis.   These conditions, singly or in combination, have accounted for up to 94% of the causes of chronic cough in prospective studies.   Other conditions have constituted no >6% of the causes in prospective studies These have included bronchogenic carcinoma, chronic interstitial pneumonia, sarcoidosis, left ventricular failure, ACEI-induced cough, and aspiration from a condition associated with pharyngeal dysfunction.   .Chronic cough is often simultaneously caused by more than one condition. A single cause has been found from 38 to 82% of the time, multiple causes from 18 to 62%. Multiply caused cough has been the result of three diseases up to 42% of the time.     This is most c/w  Classic Upper airway cough syndrome, so named because it's frequently impossible to sort out how much is  CR/sinusitis with freq throat clearing (which can be related to primary GERD)   vs  causing  secondary (" extra esophageal")  GERD from wide swings in gastric pressure that occur with throat clearing, often  promoting self use of mint and menthol lozenges that reduce the lower esophageal sphincter tone and exacerbate the problem further in a cyclical fashion.   These are the same pts (now being labeled as having "irritable larynx syndrome" by some cough centers) who not infrequently have a history of having failed to tolerate ace inhibitors,  dry powder inhalers or biphosphonates or report having atypical reflux symptoms that don't respond to standard doses of PPI , and are easily confused as having aecopd or asthma  flares by even experienced allergists/ pulmonologists.   rec hold off further abx and steroids and first try max rx for gerd/ cyclical cough.  See instructions for specific recommendations which were reviewed directly with the patient who was given a copy with highlighter outlining the key  components.

## 2011-05-23 NOTE — Assessment & Plan Note (Signed)
Not clear she really has asthma but no better on symbicort, no worse off it, no better on prednisone either  Try off symbicort for now  as in theory  It might make the cough worse/  The proper method of use, as well as anticipated side effects, of a metered-dose inhaler are discussed and demonstrated to the patient. Improved effectiveness after extensive coaching during this visit to a level of approximately  25% no better

## 2011-05-23 NOTE — Progress Notes (Signed)
Subjective:    Patient ID: Penny Curry, female    DOB: 14-Nov-1940    MRN: [id number]  HPI 71 yo woman, former tobacco, recently dx w DM. Referred by Dr Leonor Liv for dyspnea and hx ILD followed by Dr DeCoy in [place], initially received dx of IPF. Underwent FOB and Bx in 2005, treated with pred and cytoxan in the past. Then had nodules on arm that were bx and showed probable sarcoidosis Summer 2011 ([place]). Last CT scan was prob 11/2009 (she isn't sure). 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   She has had an ONO that showed she needs O2 at night. She wears 3L/min at bedtime. She had PSG last summer [facility] - said she does not have OSA but needs O2.   ROV 05/01/10 -- f/u sarcoidosis, assoc ILD and PAH. TTE since last visit PASp ~ 36 mmHg. Saw Dr Leonor Liv about 10 days ago, has had about 5 weeks cough, congestion, low grade fever. Treated 2 rounds abx, most recently avelox by Dr Leonor Liv. No prednisone. Feels like she is improving, but still limited by SOB. No PFT on record, not currently on BDs.   ROV 06/23/10 -- sarcoidosis, assoc ILD, PAH. 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 Pseudo-normalization), decreased diffusion that corrects for Va. Has been feeling fairly well, was seen for abd pain, treated w cipro for ? Diverticulitis or UTI. Breathing has been fair, she believes that the heat and pollen make her worse. Not currently on allergy regimen. Has had some typical sarcoid rash on arms, has resolved (march 12).   ROV 01/05/11 -- 71 yo woman, sarcoidosis, assoc ILD, PAH. PFT 06/2010 = Moderate mixed disease (FEV1 1.63L, 75%). Regular f/u visit. Tells me that she is having more exertional SOB over last 2 months. Very little cough. She does hear some wheeze occasionally w exertion.  She has been having scratchy throat, some difficulty swallowing with choking - has been happening for a long time, but worse last 2 months.  She is on loratadine and omeprazole.     ROV 01/25/11 -- 71 yo woman, sarcoidosis, assoc ILD, PAH. PFT 06/2010 = Moderate mixed disease (FEV1 1.63L, 75%). She returns after having her swallowing eval and her CT chest. Need to get the CT scans from [place] [facility] to compare. She returns with more cough, green mucous, nasal gtt and congestion.   03/22/2011 Acute OV  Returns for persistent cough and congestion . Been seen at PCP and urgent care x 4  over last month at Urgent care for slow to resolve PNA . Currently on Avelox day 8/10. Finished prednisone 3 weeks ago without much help. She has been on 4 different abx including levaquin and avelox .  Still has cough and congestion with thick green mucus. Has some sinus pain and pressure with drainage. No vomitting or edema. No chest pain .  Low grade fevers on/off.  Cough is wearing her out. Take tussionex with some help but does not last. Too expensive.  >>prednisone burst   04/16/2011 Acute OV  Complains of persistent cough and congestion. Worse cough for last 1 week with green mucus, wheezing, increased SOB, tightness in chest, hoarseness, low grade temp x 1 week. Seen 1 month ago for similar symptoms. CXR report from urgent care with no acute process, tx w/ steroid burst, sample of symbicort and Avelox x 7 d . Had minimal improvement and now cough is getting worse.  Has nasal congestion and drainage. Was not able to get hydromet cough syrup due to cough. Cough is aggravating and keeping her up at night. No hemopytsis or chest pain . No n/v/d. No new meds. Currently on prednisone 20mg  daily  >>Levaquin x 10 d , CT sinus +   04/30/2011 Acute OV  Complains of persistent symptoms of cough and congestion . Complains of cough with yellow, sob, wheezing, chest pain, and fever up to 101. States was in the ED last night for  Bronchitis. Has been on several abx for last 2 months . Seen  2 weeks ago with Sarcoid flare /bronchitis and sinusitis . CT sinus showed Paranasal sinus mucosal thickening/opacification  most notable involving the maxillary sinuses (greater on the left) and right ethmoid sinus air cells.she was tx with Levaquin x 10 days and prednisone held at 20mg  daily . However she tapered off steroids -misunderstood directions.  She feels her cough never got better Was seen in ER at [facility] last night, told she had bronchitis and to. follow up in our office. She Was not given any rx. Told xray was neg for PNA. Records are not available.  She does have fever on/off. Tmax 101. Recent labs with no elevated wbc per pt/daughter.  Did have desats in ER to 87%. Wears O2 at sleep only.   ROV 05/04/11 -- 71 yo woman, sarcoidosis, assoc ILD, PAH. PFT 06/2010 = Moderate mixed disease (FEV1 1.63L, 75%). On omeprazole/lansoprazole, loratadine. Has been seen monthly as above for continued nasal congestion and cough - multiple rounds abx and pred with no lasting effect, tapered down to 20mg , now back up to 40mg . She is on Symbicort - restarted ??. Her sinus CT scan shows chronic sinusitis. She has been having low grade fevers. Blows yellow green out of her nose.  rec Clindamycin x 6 weeks, pred taper off  05/23/2011 f/u ov/Danene Montijo cc intermittent fever to 101 and mucus dark turned lighter yellow while on clindamycin but stopped p 2 weeks due to rash, no better on prednisone, no worse off it, no worse off symbicort p ran out if it a week prior to OV .  Mod nasal congestion. No using ppi timed to ac. No sinus or tooth pain. Sob not changed from baseline  Sleeping ok without nocturnal  or early am exacerbation  of respiratory  c/o's or need for noct saba. Also denies any obvious fluctuation of symptoms with weather or environmental changes or other aggravating or alleviating factors except as outlined above   ROS  At present neg for  any significant sore throat, dysphagia, dental problems, itching, sneezing,  nasal congestion or excess/ purulent secretions, ear ache,   chills, sweats, unintended wt loss, pleuritic or  exertional cp, hemoptysis, palpitations, orthopnea pnd or leg swelling.  Also denies presyncope, palpitations, heartburn, abdominal pain, anorexia, nausea, vomiting, diarrhea  or change in bowel or urinary habits, change in stools or urine, dysuria,hematuria,  rash, arthralgias, visual complaints, headache, numbness weakness or ataxia or problems with walking or coordination. No noted change in mood/affect or memory.                      Objective:   Physical Exam Gen: anxious amb very hoarse wf nad  Wt Readings from Last 3 Encounters:  05/23/11 192 lb 12.8 oz (87.454 kg)  05/04/11 203 lb 3.2 oz (92.171 kg)  04/30/11 204 lb 12.8 oz (92.897 kg)     ENT: No lesions,  mouth clear,  oropharynx clear, no postnasal drip  Neck: No JVD, no TMG, no carotid bruits  Lungs: No use of accessory muscles, no dullness to percussion, Coarse BS w/ no wheezing  Harsh barking cough  Cardiovascular: RRR, heart sounds normal, no murmur or gallops, no peripheral edema  Musculoskeletal: No deformities, no cyanosis or clubbing  Neuro: alert, non focal  Skin: Warm, no lesions or rashes    CT chest 01/09/11 --   1. Interstitial, peripheral reticulation and multifocal nodular  densities are identified within both lungs. Findings are  consistent with the history of pulmonary sarcoid and interstitial  lung disease. Careful clinical correlation is recommended as the  differential consideration for the nodular opacities would include  metastatic disease.  2. No significant mediastinal or hilar adenopathy.   Assessment & Plan:

## 2011-06-06 ENCOUNTER — Ambulatory Visit: Payer: Medicare Other | Admitting: Adult Health

## 2011-10-18 ENCOUNTER — Other Ambulatory Visit: Payer: Self-pay | Admitting: Emergency Medicine

## 2011-10-23 NOTE — Telephone Encounter (Signed)
Not on patient's med list. Patient last seen 06/02/11. Dr. Delton Coombes please advise if ok to refill. Thanks.

## 2012-01-07 ENCOUNTER — Other Ambulatory Visit: Payer: Self-pay | Admitting: Emergency Medicine

## 2012-03-24 ENCOUNTER — Ambulatory Visit (INDEPENDENT_AMBULATORY_CARE_PROVIDER_SITE_OTHER): Payer: Medicare Other | Admitting: Emergency Medicine

## 2012-03-24 ENCOUNTER — Telehealth: Payer: Self-pay | Admitting: Internal Medicine

## 2012-03-24 VITALS — BP 128/74 | HR 91 | Temp 97.7°F | Resp 18 | Ht 65.0 in | Wt 193.0 lb

## 2012-03-24 DIAGNOSIS — K921 Melena: Secondary | ICD-10-CM

## 2012-03-24 LAB — POCT CBC
MCH, POC: 29.7 pg (ref 27–31.2)
MID (cbc): 0.4 (ref 0–0.9)
MPV: 8.7 fL (ref 0–99.8)
POC LYMPH PERCENT: 41.8 %L (ref 10–50)
POC MID %: 5.9 %M (ref 0–12)
Platelet Count, POC: 275 10*3/uL (ref 142–424)
RBC: 4.28 M/uL (ref 4.04–5.48)
RDW, POC: 14.8 %
WBC: 7.1 10*3/uL (ref 4.6–10.2)

## 2012-03-24 NOTE — Addendum Note (Signed)
Addended byCaffie Damme on: 03/24/2012 02:41 PM   Modules accepted: Orders

## 2012-03-24 NOTE — Patient Instructions (Addendum)
Gastrointestinal Bleeding Gastrointestinal (GI) bleeding means there is bleeding somewhere along the digestive tract, between the mouth and anus. CAUSES  There are many different problems that can cause GI bleeding. Possible causes include:  Esophagitis. This is inflammation, irritation, or swelling of the esophagus.  Hemorrhoids.These are veins that are full of blood (engorged) in the rectum. They cause pain, inflammation, and may bleed.  Anal fissures.These are areas of painful tearing which may bleed. They are often caused by passing hard stool.  Diverticulosis.These are pouches that form on the colon over time, with age, and may bleed significantly.  Diverticulitis.This is inflammation in areas with diverticulosis. It can cause pain, fever, and bloody stools, although bleeding is rare.  Polyps and cancer. Colon cancer often starts out as precancerous polyps.  Gastritis and ulcers.Bleeding from the upper gastrointestinal tract (near the stomach) may travel through the intestines and produce black, sometimes tarry, often bad smelling stools. In certain cases, if the bleeding is fast enough, the stools may not be black, but red. This condition may be life-threatening. SYMPTOMS   Vomiting bright red blood or material that looks like coffee grounds.  Bloody, black, or tarry stools. DIAGNOSIS  Your caregiver may diagnose your condition by taking your history and performing a physical exam. More tests may be needed, including:  X-rays and other imaging tests.  Esophagogastroduodenoscopy (EGD). This test uses a flexible, lighted tube to look at your esophagus, stomach, and small intestine.  Colonoscopy. This test uses a flexible, lighted tube to look at your colon. TREATMENT  Treatment depends on the cause of your bleeding.   For bleeding from the esophagus, stomach, small intestine, or colon, the caregiver doing your EGD or colonoscopy may be able to stop the bleeding as part of  the procedure.  Inflammation or infection of the colon can be treated with medicines.  Many rectal problems can be treated with creams, suppositories, or warm baths.  Surgery is sometimes needed.  Blood transfusions are sometimes needed if you have lost a lot of blood. If bleeding is slow, you may be allowed to go home. If there is a lot of bleeding, you will need to stay in the hospital for observation. HOME CARE INSTRUCTIONS   Take any medicines exactly as prescribed.  Keep your stools soft by eating foods that are high in fiber. These foods include whole grains, legumes, fruits, and vegetables. Prunes (1 to 3 a day) work well for many people.  Drink enough fluids to keep your urine clear or pale yellow.  Take sitz baths if advised. A sitz bath is when you sit in a bathtub with warm water for 10 to 15 minutes to soak, soothe, and cleanse the rectal area. SEEK IMMEDIATE MEDICAL CARE IF:   Your bleeding increases.  You feel lightheaded, weak, or you faint.  You have severe cramps in your back or abdomen.  You pass large blood clots in your stool.  Your problems are getting worse. MAKE SURE YOU:   Understand these instructions.  Will watch your condition.  Will get help right away if you are not doing well or get worse. Document Released: 02/03/2000 Document Revised: 04/30/2011 Document Reviewed: 01/15/2011 Exit[employer] Patient Information 2013 [employer].  

## 2012-03-24 NOTE — Progress Notes (Signed)
[facility] [address], [place] [address] [id number]  Date:  03/24/2012   Name:  Penny Curry   DOB:  January 25, 1941   MRN:  [id number]  PCP:  Marylen Ponto, MD    Chief Complaint: Rectal Bleeding   History of Present Illness:  Penny Curry is a 72 y.o. very pleasant female patient who presents with the following:  History of BRBPR since Thursday.  Says is painless and not associated with mass.  Had last colonoscopy 15 years ago and normal.  Denies heartburn, nausea or vomiting. Has been unusually fatigued and dizzy at time.  Denies prior GI bleeding, etoh abuse, other complaints.  Has some LLQ abdominal pain that has been present for a moderate time  Patient Active Problem List  Diagnosis  . DIABETES, TYPE 2  . HYPERTENSION  . INTERSTITIAL LUNG DISEASE  . HEADACHE, CHRONIC  . PULMONARY SARCOIDOSIS  . PULMONARY HYPERTENSION  . Allergic rhinitis, seasonal  . Chronic cough  . Sinusitis  . Asthma    Past Medical History  Diagnosis Date  . Chronic headache   . HTN (hypertension)   . Diabetes mellitus     Past Surgical History  Procedure Date  . Neck surgery 1987  . Partial hysterectomy   . Tubal ligation     History  Substance Use Topics  . Smoking status: Former Smoker -- 1.0 packs/day for 8 years    Types: Cigarettes    Quit date: 02/20/1968  . Smokeless tobacco: Never Used  . Alcohol Use: No    Family History  Problem Relation Age of Onset  . Prostate cancer Brother   . Stroke Mother   . Asthma Son   . Emphysema Father   . Lung cancer Sister     Allergies  Allergen Reactions  . Penicillins     REACTION: rash, high fever  . Clindamycin/Lincomycin Swelling and Rash    Medication list has been reviewed and updated.  Current Outpatient Prescriptions on File Prior to Visit  Medication Sig Dispense Refill  . budesonide-formoterol (SYMBICORT) 160-4.5 MCG/ACT inhaler Inhale 2 puffs into the lungs 2 (two) times daily.  1 Inhaler   12  . citalopram (CELEXA) 40 MG tablet Take 40 mg by mouth daily.        . Colesevelam HCl (WELCHOL) 3.75 G PACK Take 1 each by mouth daily.        . cyclobenzaprine (FLEXERIL) 10 MG tablet Take 10 mg by mouth 3 (three) times daily as needed.        . divalproex (DEPAKOTE) 500 MG EC tablet Take 1,000 mg by mouth daily.        . furosemide (LASIX) 40 MG tablet Take 40 mg by mouth daily.        . insulin aspart (NOVOLOG FLEXPEN) 100 UNIT/ML injection Inject 5 Units into the skin 3 (three) times daily before meals.       . insulin detemir (LEVEMIR FLEXPEN) 100 UNIT/ML injection Inject 30 Units into the skin daily with breakfast.       . Lansoprazole (PREVACID PO) Take 1 tablet by mouth daily.      Marland Kitchen omeprazole (PRILOSEC) 20 MG capsule TAKE 1 CAPSULE (20 MG TOTAL)  BY MOUTH 2 (TWO) TIMES DAILY.  60 capsule  2  . potassium chloride (KLOR-CON) 10 MEQ CR tablet Take 20 mEq by mouth daily.        . predniSONE (DELTASONE) 10 MG tablet 4 tabs for 5 days ,  then 2 tabs daily until seen back in office  75 tablet  0  . rOPINIRole (REQUIP) 3 MG tablet Take 3 mg by mouth 2 (two) times daily.        Marland Kitchen spironolactone (ALDACTONE) 25 MG tablet Take 25 mg by mouth 2 (two) times daily.        . traMADol (ULTRAM) 50 MG tablet Take 1 tablet (50 mg total) by mouth every 6 (six) hours as needed.  30 tablet  2  . albuterol (PROVENTIL HFA) 108 (90 BASE) MCG/ACT inhaler Inhale 2 puffs into the lungs every 4 (four) hours as needed for wheezing.  1 Inhaler  11  . loratadine (CLARITIN) 10 MG tablet Take 10 mg by mouth daily as needed.        Review of Systems:  As per HPI, otherwise negative.    Physical Examination: Filed Vitals:   03/24/12 1319  BP: 128/74  Pulse: 91  Temp: 97.7 F (36.5 C)  Resp: 18   Filed Vitals:   03/24/12 1319  Height: 5\' 5"  (1.651 m)  Weight: 193 lb (87.544 kg)   Body mass index is 32.12 kg/(m^2). Ideal Body Weight: Weight in (lb) to have BMI = 25: 149.9   GEN: WDWN, NAD, Non-toxic,  A & O x 3 HEENT: Atraumatic, Normocephalic. Neck supple. No masses, No LAD. Ears and Nose: No external deformity. CV: RRR, No M/G/R. No JVD. No thrill. No extra heart sounds. PULM: CTA B, no wheezes, crackles, rhonchi. No retractions. No resp. distress. No accessory muscle use. ABD: S, tender Left suprapubic area, ND, +BS. No rebound. No HSM.  No masses EXTR: No c/c/e NEURO Normal gait.  PSYCH: Normally interactive. Conversant. Not depressed or anxious appearing.  Calm demeanor.  Rectal:  Heme test positive  Assessment and Plan: Lower GI bleeding Left suprapubic abdominal pain Labs pending GI referral  Carmelina Dane, MD  Results for orders placed in visit on 03/24/12  POCT CBC      Component Value Range   WBC 7.1  4.6 - 10.2 K/uL   Lymph, poc 3.0  0.6 - 3.4   POC LYMPH PERCENT 41.8  10 - 50 %L   MID (cbc) 0.4  0 - 0.9   POC MID % 5.9  0 - 12 %M   POC Granulocyte 3.7  2 - 6.9   Granulocyte percent 52.3  37 - 80 %G   RBC 4.28  4.04 - 5.48 M/uL   Hemoglobin 12.7  12.2 - 16.2 g/dL   HCT, POC 40.9  81.1 - 47.9 %   MCV 94.2  80 - 97 fL   MCH, POC 29.7  27 - 31.2 pg   MCHC 31.5 (*) 31.8 - 35.4 g/dL   RDW, POC 91.4     Platelet Count, POC 275  142 - 424 K/uL   MPV 8.7  0 - 99.8 fL  IFOBT (OCCULT BLOOD)      Component Value Range   IFOBT Positive

## 2012-03-24 NOTE — Telephone Encounter (Signed)
Patient scheduled for tomorrow at 11:00 with Willette Cluster RNP.  [facility] Notified

## 2012-03-25 ENCOUNTER — Encounter: Payer: Self-pay | Admitting: Nurse Practitioner

## 2012-03-25 ENCOUNTER — Ambulatory Visit (INDEPENDENT_AMBULATORY_CARE_PROVIDER_SITE_OTHER): Payer: Medicare Other | Admitting: Nurse Practitioner

## 2012-03-25 VITALS — BP 100/70 | HR 82 | Ht 65.0 in | Wt 187.0 lb

## 2012-03-25 DIAGNOSIS — R1310 Dysphagia, unspecified: Secondary | ICD-10-CM

## 2012-03-25 DIAGNOSIS — K625 Hemorrhage of anus and rectum: Secondary | ICD-10-CM

## 2012-03-25 DIAGNOSIS — K649 Unspecified hemorrhoids: Secondary | ICD-10-CM

## 2012-03-25 MED ORDER — MOVIPREP 100 G PO SOLR: 1.0000 | Freq: Once | ORAL | Status: DC

## 2012-03-25 MED ORDER — HYDROCORTISONE ACETATE 25 MG RE SUPP: RECTAL | Status: DC

## 2012-03-25 NOTE — Progress Notes (Signed)
03/25/2012 Chayse Gracey [id number] 01-04-1941   HISTORY OF PRESENT ILLNESS: Patient is a 72 year old female, new to this practice, presenting for evaluation of rectal bleeding. She gives a one week history of rectal bleeding with BMs. BMs are normal., once daily. She had a colonoscopy 15 years ago in [facility] but doesn't know findings. Weight stable overall. CBC yesterday revealed hgb of 12.7, MCV 94  Patient  gives a 3 month history of LLQ pain and upper abdominal pain. The LLQ pain radiates down into left leg. Upper abdominal pain feels like a "soreness" and most noticeable when leaning over bathtub. She has a history of back problems.    Patient chronic intermittent nausea with vomiting.  She has a longstanding history of GERD, asymptomatic on twice daily PPI . Patient gives a several month history of intermittent solid food dysphagia. She had difficulty swallowing a piece of steak last night.    No CRC in family.  Brother may have history of crohn's.  Past Medical History  Diagnosis Date  . Chronic headache   . HTN (hypertension)   . Diabetes mellitus    Past Surgical History  Procedure Date  . Neck surgery 1987  . Partial hysterectomy   . Tubal ligation   . Cataract extraction     reports that she quit smoking about 44 years ago. Her smoking use included Cigarettes. She has a 8 pack-year smoking history. She has never used smokeless tobacco. She reports that she does not drink alcohol or use illicit drugs. family history includes Asthma in her son; Emphysema in her father; Lung cancer in her sister; Prostate cancer in her brother; and Stroke in her mother. Allergies  Allergen Reactions  . Penicillins     REACTION: rash, high fever  . Clindamycin/Lincomycin Swelling and Rash      Outpatient Encounter Prescriptions as of 03/25/2012  Medication Sig Dispense Refill  . budesonide-formoterol (SYMBICORT) 160-4.5 MCG/ACT inhaler Inhale 2 puffs into the lungs 2 (two) times daily.  1  Inhaler  12  . citalopram (CELEXA) 40 MG tablet Take 40 mg by mouth daily.        . Colesevelam HCl (WELCHOL) 3.75 G PACK Take 1 each by mouth daily.        . divalproex (DEPAKOTE) 500 MG EC tablet Take 1,000 mg by mouth daily.        . furosemide (LASIX) 40 MG tablet Take 40 mg by mouth daily.        . insulin aspart (NOVOLOG FLEXPEN) 100 UNIT/ML injection Inject 5 Units into the skin 3 (three) times daily before meals.       . insulin detemir (LEVEMIR FLEXPEN) 100 UNIT/ML injection Inject 30 Units into the skin daily with breakfast.       . omeprazole (PRILOSEC) 20 MG capsule TAKE 1 CAPSULE (20 MG TOTAL)  BY MOUTH 2 (TWO) TIMES DAILY.  60 capsule  2  . potassium chloride (KLOR-CON) 10 MEQ CR tablet Take 20 mEq by mouth daily.        Marland Kitchen rOPINIRole (REQUIP) 3 MG tablet Take 3 mg by mouth 2 (two) times daily.        Marland Kitchen spironolactone (ALDACTONE) 25 MG tablet Take 25 mg by mouth 2 (two) times daily.        . traMADol (ULTRAM) 50 MG tablet Take 1 tablet (50 mg total) by mouth every 6 (six) hours as needed.  30 tablet  2  . albuterol (PROVENTIL HFA) 108 (90 BASE) MCG/ACT inhaler  Inhale 2 puffs into the lungs every 4 (four) hours as needed for wheezing.  1 Inhaler  11  . cyclobenzaprine (FLEXERIL) 10 MG tablet Take 10 mg by mouth 3 (three) times daily as needed.        . loratadine (CLARITIN) 10 MG tablet Take 10 mg by mouth daily as needed.      Marland Kitchen MOVIPREP 100 G SOLR Take 1 kit (100 g total) by mouth once.  1 kit  0  . predniSONE (DELTASONE) 10 MG tablet 4 tabs for 5 days , then 2 tabs daily until seen back in office  75 tablet  0  . [DISCONTINUED] Lansoprazole (PREVACID PO) Take 1 tablet by mouth daily.         REVIEW OF SYSTEMS  : Positive for sinus trouble, arthritis, back pain, cough, shortness of breath, muscle pains, sleeping problems, swelling of feet and legs, and excessive urination . All other systems reviewed and negative except where noted in the History of Present Illness.   PHYSICAL  EXAM: BP 100/70  Pulse 82  Ht 5\' 5"  (1.651 m)  Wt 187 lb (84.823 kg)  BMI 31.12 kg/m2 General: Pleasant, obese, white female in no acute distress. [place] voice.  Head: Normocephalic and atraumatic Eyes:  sclerae anicteric,conjunctive pink. Ears: Normal auditory acuity Neck: Supple, no masses.  Lungs: Clear throughout to auscultation Heart: Regular rate and rhythm Abdomen: Soft, obese, nontender, non distended. No masses or hepatomegaly noted. Normal bowel sounds Rectal: A few mildly inflamed external hemorrhoids, mildly inflamed internal hemorrhoids on anoscopy. Musculoskeletal: Symmetrical with no gross deformities  Skin: No lesions on visible extremities Extremities: No edema  Neurological: Alert oriented x 4, grossly nonfocal Cervical Nodes:  No significant cervical adenopathy Psychological:  Alert and cooperative. Normal mood and affect  ASSESSMENT AND PLAN:  1. Painless rectal bleeding, suspect hemorrhoidal in nature. Need to exclude other etiologies such as colon polyps, neoplasm, IBD. For further evaluation patient will be scheduled for colonoscopy. The risks, benefits, and alternatives to colonoscopy with possible biopsy and possible polypectomy were discussed with the patient and she consents to proceed.   2. Several month history of nausea with occasional vomiting. Reports blood sugars to be under 150. No weight loss. She may have diabetic gastroparesis. Medication related nausea / vomiting? For further evaluation patient will be scheduled for EGD. The benefits, risks, and potential complications of EGD with possible biopsies and esophageal dilation see #4) were discussed with the patient and she agrees to proceed.    3. GERD, on BID PPI. Her voice is hoarse which she reports has been present for a year or two.   4. Intermittent solid food dysphagia. This may be related to dysmotility. She may have an esophageal stricture. Further evaluation at time of EGD.   5.  Hemorrhoids. Trial of Anusol HC suppositories  6. Pulmonary fibrosis, followed by [facility]. She uses Oxygen at night.   7. Diabetes

## 2012-03-25 NOTE — Patient Instructions (Addendum)
You have been scheduled for a colonoscopy and an upper endoscopy with Dr. Rob Bunting 04/04/12 at 9:00,  Please see additional sheets for instructions  A prescription has been sent to your pharmacy for MoviPrep and Anusol suppositories

## 2012-03-27 ENCOUNTER — Ambulatory Visit: Payer: Medicare Other | Admitting: Adult Health

## 2012-03-27 ENCOUNTER — Other Ambulatory Visit: Payer: Self-pay

## 2012-03-27 ENCOUNTER — Telehealth: Payer: Self-pay

## 2012-03-27 ENCOUNTER — Institutional Professional Consult (permissible substitution): Payer: Medicare Other | Admitting: Internal Medicine

## 2012-03-27 DIAGNOSIS — K219 Gastro-esophageal reflux disease without esophagitis: Secondary | ICD-10-CM

## 2012-03-27 DIAGNOSIS — K625 Hemorrhage of anus and rectum: Secondary | ICD-10-CM

## 2012-03-27 NOTE — Progress Notes (Signed)
I agree.  She has pulmonary fibrosis, requires oxygen at home and so is considered ASA 4, cannot be done at [facility].  Patty, can you see about changing her to [facility] cases (colo/EGD) next available Thursday time.  Thanks

## 2012-03-27 NOTE — Telephone Encounter (Signed)
Rob Bunting, MD 03/27/2012 7:51 AM Signed  I agree. She has pulmonary fibrosis, requires oxygen at home and so is considered ASA 4, cannot be done at [facility]. Aspen Deterding, can you see about changing her to [facility] cases (colo/EGD) next available Thursday time.  Thanks   Pt has been scheduled for 04/17/12 [facility]

## 2012-03-27 NOTE — Telephone Encounter (Signed)
Pt is aware of the change new instructions have been mailed to the home

## 2012-03-29 ENCOUNTER — Other Ambulatory Visit: Payer: Self-pay | Admitting: Emergency Medicine

## 2012-04-03 ENCOUNTER — Ambulatory Visit: Payer: Medicare Other | Admitting: Adult Health

## 2012-04-03 ENCOUNTER — Encounter (HOSPITAL_COMMUNITY): Payer: Self-pay | Admitting: *Deleted

## 2012-04-03 NOTE — Pre-Procedure Instructions (Addendum)
Your procedure is scheduled [id number], April 17, 2012 Report to [facility] Admitting [id number] Call this number if you have problems morning of your procedure:[phone]  Follow all bowel prep instructions per your doctor's orders.  Do not eat or drink anything after midnight the night before your procedure. You may brush your teeth, rinse out your mouth, but no water, no food, no chewing gum, no mints, no candies, no chewing tobacco.     Take these medicines the morning of your procedure with A SIP OF WATER:Prilosec and Requip and may use inhalers if needed  Taking 1/2 of insulins Wednesday, April 16, 2012 per Dr. Birdena Crandall instructions, will be on clear liquids for several days.  Please make arrangements for a responsible person to drive you home after the procedure. You cannot go home by cab/taxi. We recommend you have someone with you at home the first 24 hours after your procedure. Driver for procedure is daughter Jewel Baize [phone]  LEAVE ALL VALUABLES, JEWELRY, BILLFOLD AT HOME.  NO DENTURES, CONTACT LENSES ALLOWED IN THE ENDOSCOPY ROOM.   YOU MAY WEAR DEODORANT, PLEASE REMOVE ALL JEWELRY, WATCHES RINGS, BODY PIERCINGS AND LEAVE AT HOME.   WOMEN: NO MAKE-UP, LOTIONS PERFUMES

## 2012-04-04 ENCOUNTER — Encounter: Payer: Medicare Other | Admitting: Gastroenterology

## 2012-04-16 ENCOUNTER — Encounter (HOSPITAL_COMMUNITY): Payer: Self-pay | Admitting: Pharmacy Technician

## 2012-04-16 ENCOUNTER — Inpatient Hospital Stay (HOSPITAL_COMMUNITY)
Admission: EM | Admit: 2012-04-16 | Discharge: 2012-04-19 | DRG: 193 | Disposition: A | Discharge status: Home / Self Care | Payer: Medicare Other | Attending: Internal Medicine | Admitting: Internal Medicine

## 2012-04-16 ENCOUNTER — Telehealth: Payer: Self-pay | Admitting: Gastroenterology

## 2012-04-16 ENCOUNTER — Emergency Department (HOSPITAL_COMMUNITY): Payer: Medicare Other

## 2012-04-16 ENCOUNTER — Encounter (HOSPITAL_COMMUNITY): Payer: Self-pay

## 2012-04-16 ENCOUNTER — Ambulatory Visit (INDEPENDENT_AMBULATORY_CARE_PROVIDER_SITE_OTHER): Payer: Medicare Other | Admitting: Emergency Medicine

## 2012-04-16 ENCOUNTER — Other Ambulatory Visit: Payer: Self-pay

## 2012-04-16 VITALS — BP 137/69 | HR 108 | Temp 100.1°F | Resp 18 | Ht 64.5 in | Wt 190.6 lb

## 2012-04-16 DIAGNOSIS — R0902 Hypoxemia: Secondary | ICD-10-CM | POA: Diagnosis present

## 2012-04-16 DIAGNOSIS — J9620 Acute and chronic respiratory failure, unspecified whether with hypoxia or hypercapnia: Secondary | ICD-10-CM | POA: Diagnosis present

## 2012-04-16 DIAGNOSIS — R1310 Dysphagia, unspecified: Secondary | ICD-10-CM

## 2012-04-16 DIAGNOSIS — J189 Pneumonia, unspecified organism: Principal | ICD-10-CM | POA: Diagnosis present

## 2012-04-16 DIAGNOSIS — J329 Chronic sinusitis, unspecified: Secondary | ICD-10-CM

## 2012-04-16 DIAGNOSIS — R4182 Altered mental status, unspecified: Secondary | ICD-10-CM

## 2012-04-16 DIAGNOSIS — R51 Headache: Secondary | ICD-10-CM

## 2012-04-16 DIAGNOSIS — Z794 Long term (current) use of insulin: Secondary | ICD-10-CM

## 2012-04-16 DIAGNOSIS — J99 Respiratory disorders in diseases classified elsewhere: Secondary | ICD-10-CM | POA: Diagnosis present

## 2012-04-16 DIAGNOSIS — G9340 Encephalopathy, unspecified: Secondary | ICD-10-CM

## 2012-04-16 DIAGNOSIS — K625 Hemorrhage of anus and rectum: Secondary | ICD-10-CM | POA: Diagnosis present

## 2012-04-16 DIAGNOSIS — J8410 Pulmonary fibrosis, unspecified: Secondary | ICD-10-CM | POA: Diagnosis present

## 2012-04-16 DIAGNOSIS — I129 Hypertensive chronic kidney disease with stage 1 through stage 4 chronic kidney disease, or unspecified chronic kidney disease: Secondary | ICD-10-CM | POA: Diagnosis present

## 2012-04-16 DIAGNOSIS — J302 Other seasonal allergic rhinitis: Secondary | ICD-10-CM

## 2012-04-16 DIAGNOSIS — E119 Type 2 diabetes mellitus without complications: Secondary | ICD-10-CM

## 2012-04-16 DIAGNOSIS — Z9981 Dependence on supplemental oxygen: Secondary | ICD-10-CM

## 2012-04-16 DIAGNOSIS — R0789 Other chest pain: Secondary | ICD-10-CM | POA: Diagnosis present

## 2012-04-16 DIAGNOSIS — Z87891 Personal history of nicotine dependence: Secondary | ICD-10-CM

## 2012-04-16 DIAGNOSIS — J209 Acute bronchitis, unspecified: Secondary | ICD-10-CM | POA: Diagnosis present

## 2012-04-16 DIAGNOSIS — K219 Gastro-esophageal reflux disease without esophagitis: Secondary | ICD-10-CM | POA: Diagnosis present

## 2012-04-16 DIAGNOSIS — R05 Cough: Secondary | ICD-10-CM

## 2012-04-16 DIAGNOSIS — I2789 Other specified pulmonary heart diseases: Secondary | ICD-10-CM | POA: Diagnosis present

## 2012-04-16 DIAGNOSIS — R079 Chest pain, unspecified: Secondary | ICD-10-CM | POA: Diagnosis present

## 2012-04-16 DIAGNOSIS — D869 Sarcoidosis, unspecified: Secondary | ICD-10-CM | POA: Diagnosis present

## 2012-04-16 DIAGNOSIS — E871 Hypo-osmolality and hyponatremia: Secondary | ICD-10-CM | POA: Diagnosis present

## 2012-04-16 DIAGNOSIS — R509 Fever, unspecified: Secondary | ICD-10-CM

## 2012-04-16 DIAGNOSIS — Z66 Do not resuscitate: Secondary | ICD-10-CM | POA: Diagnosis present

## 2012-04-16 DIAGNOSIS — N182 Chronic kidney disease, stage 2 (mild): Secondary | ICD-10-CM | POA: Diagnosis present

## 2012-04-16 DIAGNOSIS — G9341 Metabolic encephalopathy: Secondary | ICD-10-CM | POA: Diagnosis present

## 2012-04-16 LAB — BASIC METABOLIC PANEL WITH GFR
BUN: 21 mg/dL (ref 6–23)
CO2: 28 meq/L (ref 19–32)
Calcium: 9.1 mg/dL (ref 8.4–10.5)
Chloride: 94 meq/L — ABNORMAL LOW (ref 96–112)
Creatinine, Ser: 0.9 mg/dL (ref 0.50–1.10)
GFR calc Af Amer: 73 mL/min — ABNORMAL LOW
GFR calc non Af Amer: 63 mL/min — ABNORMAL LOW
Glucose, Bld: 117 mg/dL — ABNORMAL HIGH (ref 70–99)
Potassium: 4.2 meq/L (ref 3.5–5.1)
Sodium: 131 meq/L — ABNORMAL LOW (ref 135–145)

## 2012-04-16 LAB — CBC
HCT: 33.7 % — ABNORMAL LOW (ref 36.0–46.0)
Hemoglobin: 12.1 g/dL (ref 12.0–15.0)
MCH: 31.8 pg (ref 26.0–34.0)
MCHC: 35.9 g/dL (ref 30.0–36.0)
MCV: 88.5 fL (ref 78.0–100.0)
Platelets: 224 10*3/uL (ref 150–400)
RBC: 3.81 MIL/uL — ABNORMAL LOW (ref 3.87–5.11)
RDW: 13.4 % (ref 11.5–15.5)
WBC: 13.5 10*3/uL — ABNORMAL HIGH (ref 4.0–10.5)

## 2012-04-16 LAB — URINE MICROSCOPIC-ADD ON

## 2012-04-16 LAB — POCT CBC
Granulocyte percent: 79.5 %G (ref 37–80)
MCV: 92.3 fL (ref 80–97)
MID (cbc): 0.8 (ref 0–0.9)
MPV: 8.8 fL (ref 0–99.8)
POC Granulocyte: 10.3 — AB (ref 2–6.9)
POC LYMPH PERCENT: 14.5 %L (ref 10–50)
POC MID %: 6 %M (ref 0–12)
Platelet Count, POC: 279 10*3/uL (ref 142–424)
RDW, POC: 14.2 %

## 2012-04-16 LAB — URINALYSIS, ROUTINE W REFLEX MICROSCOPIC
Bilirubin Urine: NEGATIVE
Glucose, UA: NEGATIVE mg/dL
Hgb urine dipstick: NEGATIVE
Ketones, ur: NEGATIVE mg/dL
Nitrite: NEGATIVE
Protein, ur: NEGATIVE mg/dL
Specific Gravity, Urine: 1.015 (ref 1.005–1.030)
Urobilinogen, UA: 0.2 mg/dL (ref 0.0–1.0)
pH: 8 (ref 5.0–8.0)

## 2012-04-16 LAB — LACTIC ACID, PLASMA: Lactic Acid, Venous: 1.5 mmol/L (ref 0.5–2.2)

## 2012-04-16 LAB — GLUCOSE, POCT (MANUAL RESULT ENTRY): POC Glucose: 83 mg/dL (ref 70–99)

## 2012-04-16 MED ORDER — SODIUM CHLORIDE 0.9 % IV BOLUS (SEPSIS)
500.0000 mL | Freq: Once | INTRAVENOUS | Status: AC
  Administered 2012-04-16: 500 mL via INTRAVENOUS

## 2012-04-16 MED ORDER — IPRATROPIUM BROMIDE 0.02 % IN SOLN
0.5000 mg | Freq: Once | RESPIRATORY_TRACT | Status: AC
  Administered 2012-04-16: 0.5 mg via RESPIRATORY_TRACT
  Filled 2012-04-16: qty 2.5

## 2012-04-16 MED ORDER — ACETAMINOPHEN 325 MG PO TABS
1000.0000 mg | ORAL_TABLET | Freq: Once | ORAL | Status: AC
  Administered 2012-04-16: 975 mg via ORAL

## 2012-04-16 MED ORDER — IBUPROFEN 400 MG PO TABS
600.0000 mg | ORAL_TABLET | Freq: Once | ORAL | Status: AC
  Administered 2012-04-16: 600 mg via ORAL
  Filled 2012-04-16 (×2): qty 1

## 2012-04-16 MED ORDER — LEVOFLOXACIN IN D5W 750 MG/150ML IV SOLN
750.0000 mg | Freq: Once | INTRAVENOUS | Status: AC
  Administered 2012-04-16: 750 mg via INTRAVENOUS
  Filled 2012-04-16: qty 150

## 2012-04-16 MED ORDER — ALBUTEROL SULFATE (5 MG/ML) 0.5% IN NEBU
5.0000 mg | INHALATION_SOLUTION | Freq: Once | RESPIRATORY_TRACT | Status: AC
  Administered 2012-04-16: 5 mg via RESPIRATORY_TRACT
  Filled 2012-04-16: qty 1

## 2012-04-16 NOTE — H&P (Signed)
PCP:  Marylen Ponto, MD  [facility]   Chief Complaint:  Confusion and fever  HPI: Penny Curry is a 72 y.o. female   has a past medical history of HTN (hypertension); Diabetes mellitus; Chronic headache; Fibrosis of lung; CHF (congestive heart failure); and GERD (gastroesophageal reflux disease).   Presented with  Cough for 1 week for which she was seen two days ago and prescribed cefdinir for bronchitis. Cough is productive of yelow to green sputum. Today she started to have fever and was seen at [facility] where it was  102.8 rectally. She was acting confused today and was disoriented. She had some headache but no meningismus. No urinary complaints. She has hx of Blood in stool for the past 3 weeks and was going to have EGD and Colonoscopy done tomorrow by [facility]. She have had only clear diet but have not started to take the bowel prep. She does endorse some muscle aches. CXR is unremarkable for acute PNA.   Patient did report some chest tightness today that was mild. Family states for the past few weeks she have had some leg swelling. She has hx of pulmonary fibrosis and pulmonary HTN.   Review of Systems:     Pertinent positives include: Fevers, chills, abdominal pain, nausea,  post nasal drip,  Bilateral lower extremity swelling   headaches, decreased urine output,  blood in stool, confusion  Constitutional:  No weight loss, night sweats, fatigue, weight loss  HEENT:  No, Difficulty swallowing,Tooth/dental problems,Sore throat,  No sneezing, itching, ear ache,  Cardio-vascular:  No chest pain, Orthopnea, PND, anasarca, dizziness, palpitations.no GI:  No heartburn, indigestion, vomiting, diarrhea, change in bowel habits, loss of appetite, melena, hematemesis Resp:  no shortness of breath at rest. No dyspnea on exertion, No excess mucus, no productive cough, No non-productive cough, No coughing up of blood.No change in color of mucus.No wheezing. Skin:  no rash  or lesions. No jaundice GU:  no dysuria, change in color of urine, no urgency or frequency. No straining to urinate.  No flank pain.  Musculoskeletal:  No joint pain or no joint swelling. No decreased range of motion. No back pain.  Psych:  No change in mood or affect. No depression or anxiety. No memory loss.  Neuro: no localizing neurological complaints, no tingling, no weakness, no double vision, no gait abnormality, no slurred speech, no  Otherwise ROS are negative except for above, 10 systems were reviewed  Past Medical History: Past Medical History  Diagnosis Date  . HTN (hypertension)   . Diabetes mellitus   . Chronic headache     migraines  . Fibrosis of lung   . CHF (congestive heart failure)   . GERD (gastroesophageal reflux disease)    Past Surgical History  Procedure Laterality Date  . Neck surgery  1987  . Partial hysterectomy    . Tubal ligation    . Cataract extraction    . Abdominal hysterectomy       Medications: Prior to Admission medications   Medication Sig Start Date End Date Taking? Authorizing Provider  albuterol (PROVENTIL HFA;VENTOLIN HFA) 108 (90 BASE) MCG/ACT inhaler Inhale 2 puffs into the lungs every 6 (six) hours as needed for wheezing or shortness of breath.   Yes Historical Provider, MD  budesonide-formoterol (SYMBICORT) 160-4.5 MCG/ACT inhaler Inhale 2 puffs into the lungs 2 (two) times daily. 04/30/11 04/29/12 Yes Tammy S Parrett, NP  cefdinir (OMNICEF) 300 MG capsule Take 300 mg by mouth 2 (  two) times daily.   Yes Historical Provider, MD  citalopram (CELEXA) 40 MG tablet Take 40 mg by mouth every evening.    Yes Historical Provider, MD  divalproex (DEPAKOTE) 500 MG EC tablet Take 1,000 mg by mouth every evening.    Yes Historical Provider, MD  fluticasone (FLONASE) 50 MCG/ACT nasal spray Place 2 sprays into the nose daily.    Yes Historical Provider, MD  furosemide (LASIX) 40 MG tablet Take 80 mg by mouth daily before breakfast.    Yes  Historical Provider, MD  insulin aspart (NOVOLOG FLEXPEN) 100 UNIT/ML injection Inject 5 Units into the skin 3 (three) times daily before meals.    Yes Historical Provider, MD  insulin detemir (LEVEMIR FLEXPEN) 100 UNIT/ML injection Inject 30 Units into the skin daily with breakfast.    Yes Historical Provider, MD  omeprazole (PRILOSEC) 20 MG capsule Take 20 mg by mouth daily.  03/29/12  Yes Leslye Peer, MD  potassium chloride (KLOR-CON) 10 MEQ CR tablet Take 20 mEq by mouth daily before breakfast.    Yes Historical Provider, MD  rOPINIRole (REQUIP) 3 MG tablet Take 3 mg by mouth 2 (two) times daily.     Yes Historical Provider, MD  spironolactone (ALDACTONE) 25 MG tablet Take 25 mg by mouth 2 (two) times daily.     Yes Historical Provider, MD  traMADol (ULTRAM) 50 MG tablet Take 50 mg by mouth 3 (three) times daily. 05/23/11  Yes Nyoka Cowden, MD    Allergies:   Allergies  Allergen Reactions  . Clindamycin/Lincomycin Swelling and Rash  . Penicillins Rash     high fever    Social History:  Ambulatory  independently Lives at  Home with family   reports that she quit smoking about 44 years ago. Her smoking use included Cigarettes. She has a 8 pack-year smoking history. She has never used smokeless tobacco. She reports that she does not drink alcohol or use illicit drugs.   Family History: family history includes Asthma in her son; Emphysema in her father; Lung cancer in her sister; Prostate cancer in her brother; and Stroke in her mother.    Physical Exam: Patient Vitals for the past 24 hrs:  BP Temp Temp src Pulse Resp SpO2  04/16/12 2204 127/65 mmHg - - 91 16 96 %  04/16/12 2013 129/54 mmHg 99.4 F (37.4 C) Oral 107 - 91 %    1. General:  in No Acute distress 2. Psychological: Alert and  Oriented 3. Head/ENT:    Dry Mucous Membranes                          Head Non traumatic, neck supple, no meningismus                          Normal  Dentition 4. SKIN: normal  Skin  turgor,  Skin clean Dry and intact no rash 5. Heart: Regular rate and rhythm no Murmur, Rub or gallop 6. Lungs:occasional  wheezes but no crackles   7. Abdomen: Soft, non-tender, Non distended 8. Lower extremities: no clubbing, cyanosis, or edema 9. Neurologically Grossly intact, moving all 4 extremities equally 10. MSK: Normal range of motion  body mass index is unknown because there is no weight on file.   Labs on Admission:   Recent Labs  04/16/12 2051  NA 131*  K 4.2  CL 94*  CO2 28  GLUCOSE 117*  BUN 21  CREATININE 0.90  CALCIUM 9.1   No results found for this basename: AST, ALT, ALKPHOS, BILITOT, PROT, ALBUMIN,  in the last 72 hours No results found for this basename: LIPASE, AMYLASE,  in the last 72 hours  Recent Labs  04/16/12 1911 04/16/12 2051  WBC 12.9* 13.5*  HGB 12.7 12.1  HCT 39.1 33.7*  MCV 92.3 88.5  PLT  --  224   No results found for this basename: CKTOTAL, CKMB, CKMBINDEX, TROPONINI,  in the last 72 hours No results found for this basename: TSH, T4TOTAL, FREET3, T3FREE, THYROIDAB,  in the last 72 hours No results found for this basename: VITAMINB12, FOLATE, FERRITIN, TIBC, IRON, RETICCTPCT,  in the last 72 hours No results found for this basename: HGBA1C    The CrCl is unknown because both a height and weight (above a minimum accepted value) are required for this calculation. ABG No results found for this basename: phart, pco2, po2, hco3, tco2, acidbasedef, o2sat     No results found for this basename: DDIMER     Other results:  I have pearsonaly reviewed this: ECG REPORT  Rate:106  Rhythm: sinus tachycardia ST&T Change: ST depression in II,iii, avf V3-v6 no change from ECG from Jan /2013  UA no evidence of UTI   Cultures: No results found for this basename: sdes, specrequest, cult, reptstatus       Radiological Exams on Admission: Dg Chest 2 View  04/16/2012  *RADIOLOGY REPORT*  Clinical Data: Confusion.  History of CHF,  diabetes, hypertension, pulmonary fibrosis.  Nonsmoker.  CHEST - 2 VIEW  Comparison: 04/29/2011  Findings: Normal heart size and pulmonary vascularity.  Vague interstitial changes in the lungs may represent chronic bronchitic changes or fibrosis.  No focal consolidation or airspace disease in the lungs.  No blunting of costophrenic angles.  No pneumothorax. Mediastinal contours appear intact.  Stable appearance since previous study.  IMPRESSION: Fibrosis and chronic bronchitic changes in the lungs.  No evidence of active pulmonary disease.   Original Report Authenticated By: Burman Nieves, M.D.     Chart has been reviewed  Assessment/Plan   72 yo F w hx of pulmonary fibrosis and pulmonary HTN here with 1 wk hx of cough and 1 day of fever, confusion and malaise  Present on Admission:  . Fever - etiology unclear, no evidence of PNA or UTI, could be viral illness, obtian influenza PCR and cover with tamiflu, for now on levaquin and awaiting results of blood cultures, she has hx of chronic pulmonary disease but given worsening cough may need repeat CXR if no improvement.  Marland Kitchen DIABETES, TYPE 2 - SSI, cont levemir . Encephalopathy - likely due to fever, dehydration now resolved. Will obtain CT of the head given sudden onset and associated headache . Rectal bleeding - hg stable this has been going on for the past 3 weeks, if acute changes would notify GI . Hyponatremia - - likely secondary to dehydration, will give IVF, check Urine Na, Cr, Osmolarity. Monitor Na levels to avoid over aggressive correction. Check TSH. Stop offending medications. If no improvement with IVF will initiate further work up for SIADH if appropriate. Hold lasix family states they do not think she has true hx of CHF  Hx of CHF no echo in the system, daughter thinks this is a misdiagnosis. Now no periferal edema, will obtain echo given recent shortness of breath  Chest pressure - atypical, likely due to pulmonary disease will obtain  serial ECG and cardiac  enzymes   Prophylaxis: SCD Protonix  CODE STATUS: DNR/DNI per patient and family  Other plan as per orders.  I have spent a total of 55 min on this admission  Navya Timmons 04/16/2012, 10:58 PM

## 2012-04-16 NOTE — ED Notes (Signed)
Internal med MD at bedside

## 2012-04-16 NOTE — Progress Notes (Signed)
[facility] [address], [place] [address] [phone]  Date:  04/16/2012   Name:  Penny Curry   DOB:  1940-12-24   MRN:  [id number]  PCP:  Marylen Ponto, MD    Chief Complaint: Cough and Fever   History of Present Illness:  Penny Curry is a 72 y.o. very pleasant female patient who presents with the following:  Seen Monday and treated with cefdinir for bronchitis.  Scheduled for colonoscopy for tomorrow.  Has an increasing fever despite antibiotics.  Brought to the office for evaluation of a mental status change and increased fever.  No nausea or vomiting.  Some abdominal discomfort related to the prep.  Has cough and chills.  Not eating today or drinking sufficient fluids.  No chest pain or visual complaints.  No wheezing or shortness of breath.  Patient Active Problem List  Diagnosis  . DIABETES, TYPE 2  . HYPERTENSION  . INTERSTITIAL LUNG DISEASE  . HEADACHE, CHRONIC  . PULMONARY SARCOIDOSIS  . PULMONARY HYPERTENSION  . Allergic rhinitis, seasonal  . Chronic cough  . Sinusitis  . Asthma  . Rectal bleeding  . Dysphagia  . Hemorrhoids    Past Medical History  Diagnosis Date  . HTN (hypertension)   . Diabetes mellitus   . Chronic headache     migraines  . Fibrosis of lung   . CHF (congestive heart failure)   . GERD (gastroesophageal reflux disease)     Past Surgical History  Procedure Laterality Date  . Neck surgery  1987  . Partial hysterectomy    . Tubal ligation    . Cataract extraction    . Abdominal hysterectomy      History  Substance Use Topics  . Smoking status: Former Smoker -- 1.00 packs/day for 8 years    Types: Cigarettes    Quit date: 02/20/1968  . Smokeless tobacco: Never Used  . Alcohol Use: No    Family History  Problem Relation Age of Onset  . Prostate cancer Brother   . Stroke Mother   . Asthma Son   . Emphysema Father   . Lung cancer Sister     Allergies  Allergen Reactions  . Penicillins    REACTION: rash, high fever  . Clindamycin/Lincomycin Swelling and Rash    Medication list has been reviewed and updated.  Current Outpatient Prescriptions on File Prior to Visit  Medication Sig Dispense Refill  . albuterol (PROVENTIL HFA;VENTOLIN HFA) 108 (90 BASE) MCG/ACT inhaler Inhale 2 puffs into the lungs every 6 (six) hours as needed for wheezing or shortness of breath.      . budesonide-formoterol (SYMBICORT) 160-4.5 MCG/ACT inhaler Inhale 2 puffs into the lungs 2 (two) times daily.      . cefdinir (OMNICEF) 300 MG capsule Take 300 mg by mouth 2 (two) times daily.      . citalopram (CELEXA) 40 MG tablet Take 40 mg by mouth every evening.       . divalproex (DEPAKOTE) 500 MG EC tablet Take 1,000 mg by mouth every evening.       . fluticasone (FLONASE) 50 MCG/ACT nasal spray Place 1 spray into the nose daily.      . furosemide (LASIX) 40 MG tablet Take 80 mg by mouth daily before breakfast.       . insulin aspart (NOVOLOG FLEXPEN) 100 UNIT/ML injection Inject 5 Units into the skin 3 (three) times daily before meals.       . insulin  detemir (LEVEMIR FLEXPEN) 100 UNIT/ML injection Inject 30 Units into the skin daily with breakfast.       . omeprazole (PRILOSEC) 20 MG capsule       . peg 3350 powder (MOVIPREP) 100 G SOLR Take 1 kit by mouth once.      . potassium chloride (KLOR-CON) 10 MEQ CR tablet Take 20 mEq by mouth daily before breakfast.       . rOPINIRole (REQUIP) 3 MG tablet Take 3 mg by mouth 2 (two) times daily.        Marland Kitchen spironolactone (ALDACTONE) 25 MG tablet Take 25 mg by mouth 2 (two) times daily.        . traMADol (ULTRAM) 50 MG tablet Take 50 mg by mouth 3 (three) times daily.       No current facility-administered medications on file prior to visit.    Review of Systems:  As per HPI, otherwise negative.    Physical Examination: Filed Vitals:   04/16/12 1849  BP: 137/69  Pulse: 108  Temp: 100.1 F (37.8 C)  Resp: 18   Filed Vitals:   04/16/12 1849  Height:  5' 4.5" (1.638 m)  Weight: 190 lb 9.6 oz (86.456 kg)   Body mass index is 32.22 kg/(m^2). Ideal Body Weight: Weight in (lb) to have BMI = 25: 147.6  GEN: WDWN, no rash, Non-toxic, Alert but confused and disoriented to time, president, date, day of the week, year. HEENT: Atraumatic, Normocephalic. Neck supple. No masses, No LAD.  Tongue dry Ears and Nose: No external deformity. CV: RRR, No M/G/R. No JVD. No thrill. No extra heart sounds. PULM: CTA B, no wheezes, crackles, rhonchi. No retractions. No resp. distress. No accessory muscle use. ABD: S, NT, ND, +BS. No rebound. No HSM. EXTR: No c/c/e NEURO Normal gait.  PSYCH: Normally interactive. Conversant. Not depressed or anxious appearing.  Calm demeanor.    Assessment and Plan: Acute mental status change Fever Dehydration NIDDM Bronchitis IV Transfer to ER for evaluation.  Carmelina Dane, MD

## 2012-04-16 NOTE — Telephone Encounter (Signed)
On call note @ 1720. Pt scheduled for colon and egd tomorrow. Pt has a fever and chills and is going to urgent care now to be evaluated. Uses home O2. Will cancel bowel prep and procedures for tomorrow. Pt can reschedule when her acute illness has resolved.

## 2012-04-16 NOTE — ED Provider Notes (Signed)
History    72 year old female brought in by daughter for evaluation of fever and confusion. Patient began having a change in her cough and some pleuritic chest pain on Saturday. Progressively worsening since then. Patient has a past history of pulmonary fibrosis, but states that her shortness of breath has been worse than her baseline. Fever to 101 at home. Patient was started on Omnicef on Monday for possible bronchitis. Her symptoms have not improved. Her daughter states that the patient has been confused. Telling her that it is 73. Speaking about Earl Lites as if he is still president, etc. Normally lucid at baseline.    CSN: [id number]  Arrival date & time 04/16/12  2005   First MD Initiated Contact with Patient 04/16/12 2012      Chief Complaint  Patient presents with  . Altered Mental Status    sent from Urgent Care for further eval of AMS and fever; dx'd with bronchitis monday - onset of fever this afternoon (101.0) with onset of "strange behavior"; was given Tylenol 1 gm PTA; temp presently 99.4    (Consider location/radiation/quality/duration/timing/severity/associated sxs/prior treatment) HPI  Past Medical History  Diagnosis Date  . HTN (hypertension)   . Diabetes mellitus   . Chronic headache     migraines  . Fibrosis of lung   . CHF (congestive heart failure)   . GERD (gastroesophageal reflux disease)     Past Surgical History  Procedure Laterality Date  . Neck surgery  1987  . Partial hysterectomy    . Tubal ligation    . Cataract extraction    . Abdominal hysterectomy      Family History  Problem Relation Age of Onset  . Prostate cancer Brother   . Stroke Mother   . Asthma Son   . Emphysema Father   . Lung cancer Sister     History  Substance Use Topics  . Smoking status: Former Smoker -- 1.00 packs/day for 8 years    Types: Cigarettes    Quit date: 02/20/1968  . Smokeless tobacco: Never Used  . Alcohol Use: No    OB History   Grav Para  Term Preterm Abortions TAB SAB Ect Mult Living                  Review of Systems  All systems reviewed and negative, other than as noted in HPI.   Allergies  Penicillins and Clindamycin/lincomycin  Home Medications   Current Outpatient Rx  Name  Route  Sig  Dispense  Refill  . albuterol (PROVENTIL HFA;VENTOLIN HFA) 108 (90 BASE) MCG/ACT inhaler   Inhalation   Inhale 2 puffs into the lungs every 6 (six) hours as needed for wheezing or shortness of breath.         . budesonide-formoterol (SYMBICORT) 160-4.5 MCG/ACT inhaler   Inhalation   Inhale 2 puffs into the lungs 2 (two) times daily.         . cefdinir (OMNICEF) 300 MG capsule   Oral   Take 300 mg by mouth 2 (two) times daily.         . citalopram (CELEXA) 40 MG tablet   Oral   Take 40 mg by mouth every evening.          . divalproex (DEPAKOTE) 500 MG EC tablet   Oral   Take 1,000 mg by mouth every evening.          . fluticasone (FLONASE) 50 MCG/ACT nasal spray   Nasal   Place  1 spray into the nose daily.         . furosemide (LASIX) 40 MG tablet   Oral   Take 80 mg by mouth daily before breakfast.          . insulin aspart (NOVOLOG FLEXPEN) 100 UNIT/ML injection   Subcutaneous   Inject 5 Units into the skin 3 (three) times daily before meals.          . insulin detemir (LEVEMIR FLEXPEN) 100 UNIT/ML injection   Subcutaneous   Inject 30 Units into the skin daily with breakfast.          . omeprazole (PRILOSEC) 20 MG capsule               . peg 3350 powder (MOVIPREP) 100 G SOLR   Oral   Take 1 kit by mouth once.         . potassium chloride (KLOR-CON) 10 MEQ CR tablet   Oral   Take 20 mEq by mouth daily before breakfast.          . rOPINIRole (REQUIP) 3 MG tablet   Oral   Take 3 mg by mouth 2 (two) times daily.           Marland Kitchen spironolactone (ALDACTONE) 25 MG tablet   Oral   Take 25 mg by mouth 2 (two) times daily.           . traMADol (ULTRAM) 50 MG tablet   Oral    Take 50 mg by mouth 3 (three) times daily.           BP 129/54  Pulse 107  Temp(Src) 99.4 F (37.4 C) (Oral)  SpO2 91%  Physical Exam  Nursing note and vitals reviewed. Constitutional: She appears well-developed and well-nourished.  HENT:  Head: Normocephalic and atraumatic.  Eyes: Conjunctivae are normal. Right eye exhibits no discharge. Left eye exhibits no discharge.  Neck: Neck supple.  No nuchal rigidity  Cardiovascular: Normal rate, regular rhythm and normal heart sounds.  Exam reveals no gallop and no friction rub.   No murmur heard. Pulmonary/Chest: Effort normal and breath sounds normal.  Mild tachypnea  Abdominal: Soft. She exhibits no distension. There is no tenderness.  Musculoskeletal: She exhibits no edema and no tenderness.  Lower extremities symmetric as compared to each other. No calf tenderness. Negative Homan's. No palpable cords.   Neurological: She is alert. No cranial nerve deficit. She exhibits normal muscle tone. Coordination normal.  Speech is clear. Answering questions appropriately. Following commands.  Skin: Skin is warm. She is diaphoretic.  Psychiatric: She has a normal mood and affect. Her behavior is normal. Thought content normal.    ED Course  Procedures (including critical care time)  Labs Reviewed  CBC - Abnormal; Notable for the following:    WBC 13.5 (*)    RBC 3.81 (*)    HCT 33.7 (*)    All other components within normal limits  URINALYSIS, ROUTINE W REFLEX MICROSCOPIC - Abnormal; Notable for the following:    APPearance HAZY (*)    Leukocytes, UA SMALL (*)    All other components within normal limits  URINE MICROSCOPIC-ADD ON - Abnormal; Notable for the following:    Squamous Epithelial / LPF FEW (*)    All other components within normal limits  BASIC METABOLIC PANEL  LACTIC ACID, PLASMA   Dg Chest 2 View  04/16/2012  *RADIOLOGY REPORT*  Clinical Data: Confusion.  History of CHF, diabetes, hypertension, pulmonary fibrosis.  Nonsmoker.  CHEST - 2 VIEW  Comparison: 04/29/2011  Findings: Normal heart size and pulmonary vascularity.  Vague interstitial changes in the lungs may represent chronic bronchitic changes or fibrosis.  No focal consolidation or airspace disease in the lungs.  No blunting of costophrenic angles.  No pneumothorax. Mediastinal contours appear intact.  Stable appearance since previous study.  IMPRESSION: Fibrosis and chronic bronchitic changes in the lungs.  No evidence of active pulmonary disease.   Original Report Authenticated By: Burman Nieves, M.D.      1. Encephalopathy   2. Fever       MDM  72 year old female with mild encephalopathy likely related to febrile illness. Patient complains shortness of breath and a cough. Suspect viral illness. Chest x-ray is clear. Given her age, confusion and mild hypoxia will admit for observation. Levaquin for possible occult pneumonia.          Raeford Razor, MD 04/16/12 2258

## 2012-04-16 NOTE — ED Notes (Signed)
Pt 86% RA after returning from xray. Pt appears SOB. Placed on 3L via [place]; sats at 95%

## 2012-04-16 NOTE — ED Notes (Signed)
Pt's daughter's contact numbers: [phone] OR [phone]

## 2012-04-16 NOTE — ED Notes (Signed)
Placed on 2L o2/[place] - o2 sats increasing from 91% to 96%

## 2012-04-17 ENCOUNTER — Encounter (HOSPITAL_COMMUNITY)
Admission: EM | Disposition: A | Discharge status: Home / Self Care | Payer: Self-pay | Source: Home / Self Care | Attending: Internal Medicine

## 2012-04-17 ENCOUNTER — Encounter (HOSPITAL_COMMUNITY): Payer: Self-pay | Admitting: Radiology

## 2012-04-17 ENCOUNTER — Inpatient Hospital Stay (HOSPITAL_COMMUNITY): Payer: Medicare Other

## 2012-04-17 ENCOUNTER — Ambulatory Visit (HOSPITAL_COMMUNITY): Admission: RE | Admit: 2012-04-17 | Payer: Medicare Other | Source: Ambulatory Visit | Admitting: Gastroenterology

## 2012-04-17 DIAGNOSIS — D869 Sarcoidosis, unspecified: Secondary | ICD-10-CM

## 2012-04-17 DIAGNOSIS — R079 Chest pain, unspecified: Secondary | ICD-10-CM | POA: Diagnosis present

## 2012-04-17 DIAGNOSIS — J9620 Acute and chronic respiratory failure, unspecified whether with hypoxia or hypercapnia: Secondary | ICD-10-CM

## 2012-04-17 DIAGNOSIS — N182 Chronic kidney disease, stage 2 (mild): Secondary | ICD-10-CM | POA: Diagnosis present

## 2012-04-17 DIAGNOSIS — R7989 Other specified abnormal findings of blood chemistry: Secondary | ICD-10-CM | POA: Diagnosis present

## 2012-04-17 DIAGNOSIS — J209 Acute bronchitis, unspecified: Secondary | ICD-10-CM

## 2012-04-17 DIAGNOSIS — R0902 Hypoxemia: Secondary | ICD-10-CM | POA: Diagnosis present

## 2012-04-17 HISTORY — DX: Pulmonary fibrosis, unspecified: J84.10

## 2012-04-17 HISTORY — DX: Heart failure, unspecified: I50.9

## 2012-04-17 HISTORY — DX: Gastro-esophageal reflux disease without esophagitis: K21.9

## 2012-04-17 LAB — COMPREHENSIVE METABOLIC PANEL
BUN: 26 mg/dL — ABNORMAL HIGH (ref 6–23)
CO2: 25 mEq/L (ref 19–32)
Calcium: 8.8 mg/dL (ref 8.4–10.5)
Chloride: 96 mEq/L (ref 96–112)
Creatinine, Ser: 1.05 mg/dL (ref 0.50–1.10)
GFR calc Af Amer: 61 mL/min — ABNORMAL LOW (ref >90)
GFR calc non Af Amer: 53 mL/min — ABNORMAL LOW (ref >90)
Glucose, Bld: 88 mg/dL (ref 70–99)
Total Bilirubin: 0.3 mg/dL (ref 0.3–1.2)

## 2012-04-17 LAB — VALPROIC ACID LEVEL: Valproic Acid Lvl: 33.3 ug/mL — ABNORMAL LOW (ref 50.0–100.0)

## 2012-04-17 LAB — HEMOGLOBIN A1C: Mean Plasma Glucose: 137 mg/dL — ABNORMAL HIGH (ref <117)

## 2012-04-17 LAB — MRSA PCR SCREENING: MRSA by PCR: NEGATIVE

## 2012-04-17 LAB — CREATININE, URINE, RANDOM: Creatinine, Urine: 46.6 mg/dL

## 2012-04-17 LAB — GLUCOSE, CAPILLARY
Glucose-Capillary: 104 mg/dL — ABNORMAL HIGH (ref 70–99)
Glucose-Capillary: 147 mg/dL — ABNORMAL HIGH (ref 70–99)

## 2012-04-17 LAB — CBC
MCH: 31.9 pg (ref 26.0–34.0)
Platelets: 203 10*3/uL (ref 150–400)
RBC: 3.61 MIL/uL — ABNORMAL LOW (ref 3.87–5.11)
WBC: 13.6 10*3/uL — ABNORMAL HIGH (ref 4.0–10.5)

## 2012-04-17 LAB — SODIUM, URINE, RANDOM: Sodium, Ur: 37 mEq/L

## 2012-04-17 LAB — OSMOLALITY, URINE: Osmolality, Ur: 350 mOsm/kg — ABNORMAL LOW (ref 390–1090)

## 2012-04-17 LAB — TROPONIN I
Troponin I: <0.3 ng/mL (ref <0.30)
Troponin I: <0.3 ng/mL (ref <0.30)

## 2012-04-17 SURGERY — ESOPHAGOGASTRODUODENOSCOPY (EGD) WITH PROPOFOL
Anesthesia: Monitor Anesthesia Care

## 2012-04-17 MED ORDER — OSELTAMIVIR PHOSPHATE 75 MG PO CAPS
75.0000 mg | ORAL_CAPSULE | Freq: Two times a day (BID) | ORAL | Status: DC
  Administered 2012-04-17 (×3): 75 mg via ORAL
  Filled 2012-04-17 (×5): qty 1

## 2012-04-17 MED ORDER — ACETAMINOPHEN 325 MG PO TABS: 650.0000 mg | ORAL_TABLET | Freq: Four times a day (QID) | ORAL | Status: DC | PRN

## 2012-04-17 MED ORDER — ALBUTEROL SULFATE HFA 108 (90 BASE) MCG/ACT IN AERS
2.0000 | INHALATION_SPRAY | Freq: Four times a day (QID) | RESPIRATORY_TRACT | Status: DC | PRN
  Filled 2012-04-17: qty 6.7

## 2012-04-17 MED ORDER — PANTOPRAZOLE SODIUM 40 MG PO TBEC
40.0000 mg | DELAYED_RELEASE_TABLET | Freq: Every day | ORAL | Status: DC
  Administered 2012-04-17 – 2012-04-19 (×3): 40 mg via ORAL
  Filled 2012-04-17 (×3): qty 1

## 2012-04-17 MED ORDER — ONDANSETRON HCL 4 MG PO TABS
4.0000 mg | ORAL_TABLET | Freq: Four times a day (QID) | ORAL | Status: DC | PRN
  Administered 2012-04-17: 4 mg via ORAL
  Filled 2012-04-17: qty 1

## 2012-04-17 MED ORDER — ROPINIROLE HCL 1 MG PO TABS
3.0000 mg | ORAL_TABLET | Freq: Two times a day (BID) | ORAL | Status: DC
  Administered 2012-04-17 – 2012-04-19 (×6): 3 mg via ORAL
  Filled 2012-04-17 (×7): qty 3

## 2012-04-17 MED ORDER — BUDESONIDE-FORMOTEROL FUMARATE 160-4.5 MCG/ACT IN AERO
2.0000 | INHALATION_SPRAY | Freq: Two times a day (BID) | RESPIRATORY_TRACT | Status: DC
  Administered 2012-04-17 – 2012-04-19 (×4): 2 via RESPIRATORY_TRACT
  Filled 2012-04-17: qty 6

## 2012-04-17 MED ORDER — HYDROCODONE-ACETAMINOPHEN 5-325 MG PO TABS
1.0000 | ORAL_TABLET | ORAL | Status: DC | PRN
  Administered 2012-04-17 – 2012-04-18 (×3): 2 via ORAL
  Administered 2012-04-19: 1 via ORAL
  Filled 2012-04-17: qty 1
  Filled 2012-04-17 (×3): qty 2

## 2012-04-17 MED ORDER — CITALOPRAM HYDROBROMIDE 40 MG PO TABS
40.0000 mg | ORAL_TABLET | Freq: Every evening | ORAL | Status: DC
  Administered 2012-04-17 – 2012-04-18 (×2): 40 mg via ORAL
  Filled 2012-04-17 (×3): qty 1

## 2012-04-17 MED ORDER — INSULIN ASPART 100 UNIT/ML ~~LOC~~ SOLN: 0.0000 [IU] | Freq: Three times a day (TID) | SUBCUTANEOUS | Status: DC

## 2012-04-17 MED ORDER — ONDANSETRON HCL 4 MG/2ML IJ SOLN
4.0000 mg | Freq: Four times a day (QID) | INTRAMUSCULAR | Status: DC | PRN
  Administered 2012-04-19: 4 mg via INTRAVENOUS
  Filled 2012-04-17: qty 2

## 2012-04-17 MED ORDER — ACETAMINOPHEN 650 MG RE SUPP: 650.0000 mg | Freq: Four times a day (QID) | RECTAL | Status: DC | PRN

## 2012-04-17 MED ORDER — INSULIN DETEMIR 100 UNIT/ML ~~LOC~~ SOLN
30.0000 [IU] | Freq: Every day | SUBCUTANEOUS | Status: DC
  Administered 2012-04-17: 30 [IU] via SUBCUTANEOUS
  Filled 2012-04-17: qty 10

## 2012-04-17 MED ORDER — LEVOFLOXACIN IN D5W 750 MG/150ML IV SOLN
750.0000 mg | INTRAVENOUS | Status: DC
  Administered 2012-04-17 – 2012-04-18 (×2): 750 mg via INTRAVENOUS
  Filled 2012-04-17 (×4): qty 150

## 2012-04-17 MED ORDER — SODIUM CHLORIDE 0.9 % IV SOLN
INTRAVENOUS | Status: AC
  Administered 2012-04-17: 02:00:00 via INTRAVENOUS

## 2012-04-17 MED ORDER — DIVALPROEX SODIUM 500 MG PO DR TAB
1000.0000 mg | DELAYED_RELEASE_TABLET | Freq: Every evening | ORAL | Status: DC
  Administered 2012-04-17 – 2012-04-18 (×2): 1000 mg via ORAL
  Filled 2012-04-17 (×3): qty 2

## 2012-04-17 MED ORDER — FLUTICASONE PROPIONATE 50 MCG/ACT NA SUSP
2.0000 | Freq: Every day | NASAL | Status: DC
  Administered 2012-04-17 – 2012-04-18 (×2): 2 via NASAL
  Filled 2012-04-17: qty 16

## 2012-04-17 MED ORDER — INSULIN ASPART 100 UNIT/ML ~~LOC~~ SOLN: 0.0000 [IU] | Freq: Every day | SUBCUTANEOUS | Status: DC

## 2012-04-17 MED ORDER — TRAMADOL HCL 50 MG PO TABS
50.0000 mg | ORAL_TABLET | Freq: Three times a day (TID) | ORAL | Status: DC
  Administered 2012-04-17 – 2012-04-19 (×7): 50 mg via ORAL
  Filled 2012-04-17 (×9): qty 1

## 2012-04-17 MED ORDER — SPIRONOLACTONE 25 MG PO TABS
25.0000 mg | ORAL_TABLET | Freq: Two times a day (BID) | ORAL | Status: DC
  Filled 2012-04-17 (×3): qty 1

## 2012-04-17 MED ORDER — DOCUSATE SODIUM 100 MG PO CAPS
100.0000 mg | ORAL_CAPSULE | Freq: Two times a day (BID) | ORAL | Status: DC
  Administered 2012-04-17 – 2012-04-19 (×6): 100 mg via ORAL
  Filled 2012-04-17 (×7): qty 1

## 2012-04-17 MED ORDER — SODIUM CHLORIDE 0.9 % IJ SOLN
3.0000 mL | Freq: Two times a day (BID) | INTRAMUSCULAR | Status: DC
  Administered 2012-04-17 (×2): 3 mL via INTRAVENOUS
  Administered 2012-04-18: 10:00:00 via INTRAVENOUS
  Administered 2012-04-18 – 2012-04-19 (×2): 3 mL via INTRAVENOUS

## 2012-04-17 NOTE — Progress Notes (Signed)
[facility] Progress Note [place] TEAM 1 - Stepdown/ICU TEAM   Penny Curry ZOX:[id number] DOB: 07-Apr-1940 DOA: 04/16/2012 PCP: Marylen Ponto, MD  Brief narrative: 72 year old female patient with underlying pulmonary sarcoid and interstitial lung disease. Treated for today for cough related to bronchitis with antibiotics for symptoms that began one week prior. On the date of admission she presented to urgent care because of complaints of fever and was found to have a temperature of 102.8 rectally. She also apparently was confused and disoriented. She was supposed to undergo colonoscopy and EGD on 04/17/2012 her recent issues with rectal bleeding and at time of evaluation was on a clear liquid diet and was beginning to start her bowel prep. In the emergency department her chest x-ray did not show any evidence of focal infiltrates consistent with pneumonia. She has also been having some leg swelling and chest tightness that is not severe. At home she takes Lasix and Aldactone. Her sodium at presentation was 131. She had a mild leukocytosis of 12,900. All other laboratory data was unremarkable. Lactic acid was 1.5. Urinalysis appeared consistent with dehydration but not with a urinary tract infection. EKG had nonspecific ST changes in the anterolateral leads.  Assessment/Plan: Active Problems:   Acute-on-chronic respiratory failure with Hypoxia due to: A) Acute bronchitis B) INTERSTITIAL LUNG DISEASE/PULMONARY SARCOIDOSIS/PULMONARY HYPERTENSION -Uses nocturnal O2 at home- stable -productive cough since admit (reddish/brown) -cont. anbx's and pulmonary toilet -does not appear to have acute pneumonitis flare -Influenza negative    Dehydration with hyponatremia -2/2 fever and home diuretics -cont IVF and hold diuretics      Metabolic encephalopathy/Fever -improved; CT head negative in ER and exam non-focal/no neuro deficits    CKD (chronic kidney disease) stage 2, GFR 60-89 ml/min -Scr  stable- follow lytes    Chest pain, unspecified -enzymes have been negative and currently w/o CP -FU on ECHO- if RWMA consult cards    Diabetes mellitus type 2, controlled -cont SSI/Levemir -HgbA1c pending    Rectal bleeding -FU w/Dr. Russella Dar after dc to reschedule EGD/colonoscopy -Hgb stable here but watch for decrease w/ hydration -if develops significant bleeding while IP rec GI consult    Abnormal TSH -mild elevation- ck FreeT4/T3   DVT prophylaxis: SCDs Code Status: DO NOT RESUSCITATE Family Communication: Patient Disposition Plan: Transfer to telemetry  Consultants: None  Procedures: 2-D echocardiogram (04/17/12) results pending  Antibiotics: Levaquin 2/26 >>> Tamiflu 2/27 >>>  HPI/Subjective: Patient awakened endorses productive reddish-brown/rust-colored sputum since admission. States feels dry and thirsty. No further chest pain endorsed.   Objective: Blood pressure 116/60, pulse 97, temperature 97.9 F (36.6 C), temperature source Oral, resp. rate 27, height 5\' 5"  (1.651 m), weight 87.6 kg (193 lb 2 oz), SpO2 95.00%.  Intake/Output Summary (Last 24 hours) at 04/17/12 1045 Last data filed at 04/17/12 0900  Gross per 24 hour  Intake    753 ml  Output    300 ml  Net    453 ml     Exam: General: No acute respiratory distress Lungs: Bilateral wheezes without crackles, decreased in bases, 2 L nasal cannula oxygen Cardiovascular: Regular rate and rhythm without murmur gallop or rub normal S1 and S2, no peripheral edema and no JVD Abdomen: Nontender, nondistended, soft, bowel sounds positive, no rebound, no ascites, no appreciable mass Musculoskeletal: No significant cyanosis, clubbing of bilateral lower extremities Neurological: Awake and oriented x3, weakly moves all extremities x4 without any focal deficits appreciated, cranial nerves II through XII grossly intact  Data Reviewed: Basic  Metabolic Panel:  Recent Labs Lab 04/16/12 2051 04/17/12 0245  NA  131* 132*  K 4.2 3.5  CL 94* 96  CO2 28 25  GLUCOSE 117* 88  BUN 21 26*  CREATININE 0.90 1.05  CALCIUM 9.1 8.8  MG  --  1.9  PHOS  --  3.6   Liver Function Tests:  Recent Labs Lab 04/17/12 0245  AST 20  ALT 17  ALKPHOS 84  BILITOT 0.3  PROT 7.6  ALBUMIN 3.2*   No results found for this basename: LIPASE, AMYLASE,  in the last 168 hours No results found for this basename: AMMONIA,  in the last 168 hours CBC:  Recent Labs Lab 04/16/12 2051 04/17/12 0245  WBC 13.5* 13.6*  HGB 12.1 11.5*  HCT 33.7* 32.2*  MCV 88.5 89.2  PLT 224 203   Cardiac Enzymes:  Recent Labs Lab 04/17/12 0245 04/17/12 0805  TROPONINI <0.30 <0.30   BNP (last 3 results) No results found for this basename: PROBNP,  in the last 8760 hours CBG:  Recent Labs Lab 04/17/12 0754  GLUCAP 104*    Recent Results (from the past 240 hour(s))  MRSA PCR SCREENING     Status: None   Collection Time    04/17/12  1:11 AM      Result Value Range Status   MRSA by PCR NEGATIVE  NEGATIVE Final   Comment:            The GeneXpert MRSA Assay (FDA     approved for NASAL specimens     only), is one component of a     comprehensive MRSA colonization     surveillance program. It is not     intended to diagnose MRSA     infection nor to guide or     monitor treatment for     MRSA infections.     Studies:  Recent x-ray studies have been reviewed in detail by the Attending Physician  Scheduled Meds:  Reviewed in detail by the Attending Physician   Junious Silk, ANP [facility] Office  [phone] Pager [phone]  On-Call/Text Page:      [email or web address]      password [email or web address]  If 7PM-7AM, please contact night-coverage [email or web address] Password [email or web address] 04/17/2012, 10:45 AM   LOS: 1 day   I have examined the patient, reviewed the chart and modified the above note which I agree with.   Paquita Printy,MD [phone] 04/17/2012, 2:01 PM

## 2012-04-17 NOTE — Progress Notes (Signed)
Report received from May, RN on 2600.

## 2012-04-17 NOTE — Progress Notes (Signed)
Patient alert and oriented times 4. Vital signs stable. Patient being transported to room 5522 via hospital bed accompained by 2600 staff. Patient monitored via centralized tele.

## 2012-04-17 NOTE — Progress Notes (Signed)
*  PRELIMINARY RESULTS* Echocardiogram 2D Echocardiogram has been performed.  Jeryl Columbia 04/17/2012, 9:59 AM

## 2012-04-17 NOTE — Telephone Encounter (Signed)
Patty, Can you call her next week about timing of reshceduling her colo/egd at [facility] with propofol.

## 2012-04-17 NOTE — Progress Notes (Signed)
Utilization Review Completed. 04/17/2012  

## 2012-04-17 NOTE — ED Notes (Signed)
Pt presently awake, alert, oriented to person and time only; currently febrile, skin flushed, hot to touch, dry; daughter at bedside; plan of care discussed

## 2012-04-18 DIAGNOSIS — N182 Chronic kidney disease, stage 2 (mild): Secondary | ICD-10-CM

## 2012-04-18 LAB — T3: T3, Total: 91.8 ng/dl (ref 80.0–204.0)

## 2012-04-18 LAB — CBC
Hemoglobin: 10.6 g/dL — ABNORMAL LOW (ref 12.0–15.0)
MCH: 31.1 pg (ref 26.0–34.0)
MCV: 92.1 fL (ref 78.0–100.0)
RBC: 3.41 MIL/uL — ABNORMAL LOW (ref 3.87–5.11)
WBC: 7.4 10*3/uL (ref 4.0–10.5)

## 2012-04-18 LAB — BASIC METABOLIC PANEL
CO2: 30 mEq/L (ref 19–32)
GFR calc non Af Amer: 83 mL/min — ABNORMAL LOW (ref >90)
Glucose, Bld: 121 mg/dL — ABNORMAL HIGH (ref 70–99)
Potassium: 4.8 mEq/L (ref 3.5–5.1)
Sodium: 136 mEq/L (ref 135–145)

## 2012-04-18 LAB — GLUCOSE, CAPILLARY: Glucose-Capillary: 122 mg/dL — ABNORMAL HIGH (ref 70–99)

## 2012-04-18 MED ORDER — INSULIN DETEMIR 100 UNIT/ML ~~LOC~~ SOLN
30.0000 [IU] | Freq: Every day | SUBCUTANEOUS | Status: DC
  Administered 2012-04-18 – 2012-04-19 (×2): 30 [IU] via SUBCUTANEOUS
  Filled 2012-04-18: qty 10

## 2012-04-18 MED ORDER — PREDNISONE 50 MG PO TABS
50.0000 mg | ORAL_TABLET | Freq: Every day | ORAL | Status: DC
  Administered 2012-04-18 – 2012-04-19 (×2): 50 mg via ORAL
  Filled 2012-04-18 (×5): qty 1

## 2012-04-18 MED ORDER — FUROSEMIDE 40 MG PO TABS
40.0000 mg | ORAL_TABLET | Freq: Every day | ORAL | Status: DC
  Administered 2012-04-18 – 2012-04-19 (×2): 40 mg via ORAL
  Filled 2012-04-18 (×3): qty 1

## 2012-04-18 MED ORDER — ALBUTEROL SULFATE HFA 108 (90 BASE) MCG/ACT IN AERS
2.0000 | INHALATION_SPRAY | Freq: Four times a day (QID) | RESPIRATORY_TRACT | Status: DC
  Administered 2012-04-18 (×2): 2 via RESPIRATORY_TRACT
  Filled 2012-04-18: qty 6.7

## 2012-04-18 MED ORDER — INSULIN ASPART 100 UNIT/ML ~~LOC~~ SOLN
0.0000 [IU] | Freq: Three times a day (TID) | SUBCUTANEOUS | Status: DC
  Administered 2012-04-18 – 2012-04-19 (×2): 2 [IU] via SUBCUTANEOUS

## 2012-04-18 MED ORDER — POLYETHYLENE GLYCOL 3350 17 G PO PACK
17.0000 g | PACK | Freq: Every day | ORAL | Status: DC
  Administered 2012-04-18 – 2012-04-19 (×2): 17 g via ORAL
  Filled 2012-04-18 (×3): qty 1

## 2012-04-18 MED ORDER — INSULIN ASPART 100 UNIT/ML ~~LOC~~ SOLN
0.0000 [IU] | Freq: Three times a day (TID) | SUBCUTANEOUS | Status: DC
  Administered 2012-04-18: 1 [IU] via SUBCUTANEOUS

## 2012-04-18 NOTE — Evaluation (Signed)
Physical Therapy Evaluation Patient Details Name: Penny Curry MRN: [id number] DOB: December 26, 1940 Today's Date: 04/18/2012 Time: 4098-1191 PT Time Calculation (min): 21 min  PT Assessment / Plan / Recommendation Clinical Impression  Pt adm with acute bronchitis.  Pt reports being limited at home by breathing.  Will follow acutely for PT to maximize I and safety before return home.  Pt does report several falls at home but declined HHPT.    PT Assessment  Patient needs continued PT services    Follow Up Recommendations  No PT follow up    Does the patient have the potential to tolerate intense rehabilitation      Barriers to Discharge        Equipment Recommendations  None recommended by PT    Recommendations for Other Services     Frequency Min 3X/week    Precautions / Restrictions Precautions Precautions: Fall   Pertinent Vitals/Pain SaO2 87% amb on RA.  Pt has been using O2 at night at home.      Mobility  Bed Mobility Bed Mobility: Supine to Sit;Sitting - Scoot to Edge of Bed Supine to Sit: 6: Modified independent (Device/Increase time);HOB elevated Sitting - Scoot to Edge of Bed: 6: Modified independent (Device/Increase time) Transfers Transfers: Sit to Stand;Stand to Sit Sit to Stand: 6: Modified independent (Device/Increase time);With upper extremity assist;From bed;From toilet Stand to Sit: 6: Modified independent (Device/Increase time);With upper extremity assist;To bed;To toilet Ambulation/Gait Ambulation/Gait Assistance: 5: Supervision Ambulation Distance (Feet): 125 Feet Assistive device: None;Rolling walker Ambulation/Gait Assistance Details: Pt feels slightly unsteady but no LOB.  Used RW in hallway but no device in smaller environment in room. Gait Pattern: Step-through pattern;Decreased stride length Gait velocity: decr General Gait Details: Pt reports an occasional catching feeling in her groin area.    Exercises     PT Diagnosis: Difficulty walking   PT Problem List: Decreased balance;Decreased mobility PT Treatment Interventions: DME instruction;Gait training;Functional mobility training;Therapeutic activities;Balance training;Patient/family education   PT Goals Acute Rehab PT Goals PT Goal Formulation: With patient Time For Goal Achievement: 04/18/12 Potential to Achieve Goals: Good Pt will Ambulate: >150 feet;with modified independence;with least restrictive assistive device PT Goal: Ambulate - Progress: Goal set today  Visit Information  Last PT Received On: 04/18/12 Assistance Needed: +1    Subjective Data  Subjective: "I think I will be all right," pt stated when asked about HHPT. Patient Stated Goal: Return home   Prior Functioning  Home Living Lives With: Daughter Available Help at Discharge: Family;Available PRN/intermittently Type of Home: House Home Access: Stairs to enter [employer] of Steps: 4 Entrance Stairs-Rails: Right Home Layout: One level Bathroom Shower/Tub: Engineer, manufacturing systems: Standard Home Adaptive Equipment: Walker - rolling (O2) Prior Function Level of Independence: Independent Able to Take Stairs?: Yes Driving: No Vocation: Retired Musician: No difficulties    Copywriter, advertising Overall Cognitive Status: Appears within functional limits for tasks assessed/performed Arousal/Alertness: Awake/alert Orientation Level: Appears intact for tasks assessed Behavior During Session: [facility] for tasks performed    Extremity/Trunk Assessment Right Lower Extremity Assessment RLE ROM/Strength/Tone: [facility] for tasks assessed Left Lower Extremity Assessment LLE ROM/Strength/Tone: [facility] for tasks assessed   Balance Balance Balance Assessed: Yes Static Standing Balance Static Standing - Balance Support: No upper extremity supported;During functional activity Static Standing - Level of Assistance: 6: Modified independent (Device/Increase time)  End of Session PT -  End of Session Activity Tolerance: Patient tolerated treatment well Patient left: in chair;with call bell/phone within reach Nurse Communication: Mobility  status  GP     Alanda Colton 04/18/2012, 12:23 PM  [facility] PT [phone]

## 2012-04-18 NOTE — Care Management Note (Signed)
    Page 1 of 1   04/21/2012     2:01:18 PM   CARE MANAGEMENT NOTE 04/21/2012  Patient:  Penny Curry,Penny Curry   Account Number:  [id number]  Date Initiated:  04/18/2012  Documentation initiated by:  Letha Cape  Subjective/Objective Assessment:   dx dm type 2  admit-lives with grand daughter.     Action/Plan:   pt eva-no needs   Anticipated DC Date:  04/19/2012   Anticipated DC Plan:  HOME/SELF CARE      DC Planning Services  CM consult      Choice offered to / List presented to:             Status of service:  Completed, signed off Medicare Important Message given?   (If response is "NO", the following Medicare IM given date fields will be blank) Date Medicare IM given:   Date Additional Medicare IM given:    Discharge Disposition:  HOME/SELF CARE  Per UR Regulation:  Reviewed for med. necessity/level of care/duration of stay  If discussed at Long Length of Stay Meetings, dates discussed:    Comments:  04/21/12 14:00 Letha Cape RN, BSN [phone] patient dc to home.  04/18/12 8:37 Letha Cape RN, BSN [phone] patient lives with grand daughter , per physical therapy patient has no pt needs.  Pateint has insurance for medications and she has transportation.  NCM will continue to follow for dc needs.

## 2012-04-18 NOTE — Progress Notes (Signed)
[facility] PROGRESS NOTE  Penny Curry WUJ:[id number] DOB: April 19, 1940 DOA: 04/16/2012 PCP: Marylen Ponto, MD  Assessment/Plan:  Encephalopathy Unknown etiology, perhaps fever and bronchitis Resolved CT head neg.  Blood cultures show no growth to date.  Bronchitis in the setting pulmonary fibrosis Coughing up tan sputum / cough still tight / Afebrile Schedule nebs Add prednisone Flutter valve / Spirometry. Levaquin Ambulating   Abdominal Pain, recent BRBPR Add miralax Guiac stool Re-schedule EGD /Colonoscopy  Diabetes Well controlled. Will increase SSI due to prednisone.  H/O CHF Echo repeated yesterday LVEF 55-60 No wall motion abnormalities Added back 40 mg lasix daily (half of home dose)   Code Status: DNR / DNI Family Communication:  Disposition Plan: PT eval pending.  Likely dc in 24 - 48 hours.    HPI/Subjective: Complaining of lower abdominal pain, bringing up tan sputum  Objective: Filed Vitals:   04/18/12 1105 04/18/12 1107 04/18/12 1114 04/18/12 1117  BP:      Pulse: 72 68    Temp:      TempSrc:      Resp:      Height:      Weight:      SpO2: 98% 95% 88% 96%    Intake/Output Summary (Last 24 hours) at 04/18/12 1321 Last data filed at 04/18/12 1206  Gross per 24 hour  Intake   1083 ml  Output      0 ml  Net   1083 ml   Filed Weights   04/17/12 0109 04/17/12 2150  Weight: 87.6 kg (193 lb 2 oz) 87.7 kg (193 lb 5.5 oz)    Exam:   General:  A&O, NAD, Comfortable in bed.  Oxygen in place  Cardiovascular: rrr, no m/r/g, no lower extremity edema  Respiratory: min crackles, minimally productive cough.  Abdomen: obese, soft, nt, nd, +BS. No masses  Data Reviewed: Basic Metabolic Panel:  Recent Labs Lab 04/16/12 2051 04/17/12 0245 04/18/12 0605  NA 131* 132* 136  K 4.2 3.5 4.8  CL 94* 96 100  CO2 28 25 30   GLUCOSE 117* 88 121*  BUN 21 26* 16  CREATININE 0.90 1.05 0.77  CALCIUM 9.1 8.8 9.3  MG  --  1.9  --   PHOS  --   3.6  --    Liver Function Tests:  Recent Labs Lab 04/17/12 0245  AST 20  ALT 17  ALKPHOS 84  BILITOT 0.3  PROT 7.6  ALBUMIN 3.2*   CBC:  Recent Labs Lab 04/16/12 2051 04/17/12 0245 04/18/12 0605  WBC 13.5* 13.6* 7.4  HGB 12.1 11.5* 10.6*  HCT 33.7* 32.2* 31.4*  MCV 88.5 89.2 92.1  PLT 224 203 198   Cardiac Enzymes:  Recent Labs Lab 04/17/12 0245 04/17/12 0805 04/17/12 1324  TROPONINI <0.30 <0.30 <0.30   CBG:  Recent Labs Lab 04/17/12 1153 04/17/12 1647 04/17/12 2148 04/18/12 0813 04/18/12 1208  GLUCAP 156* 186* 147* 111* 125*    Recent Results (from the past 240 hour(s))  MRSA PCR SCREENING     Status: None   Collection Time    04/17/12  1:11 AM      Result Value Range Status   MRSA by PCR NEGATIVE  NEGATIVE Final   Comment:            The GeneXpert MRSA Assay (FDA     approved for NASAL specimens     only), is one component of a     comprehensive MRSA colonization     surveillance  program. It is not     intended to diagnose MRSA     infection nor to guide or     monitor treatment for     MRSA infections.  CULTURE, BLOOD (ROUTINE X 2)     Status: None   Collection Time    04/17/12  2:45 AM      Result Value Range Status   Specimen Description BLOOD LEFT ARM   Final   Special Requests BOTTLES DRAWN AEROBIC ONLY 10CC   Final   Culture  Setup Time 04/17/2012 08:36   Final   Culture     Final   Value:        BLOOD CULTURE RECEIVED NO GROWTH TO DATE CULTURE WILL BE HELD FOR 5 DAYS BEFORE ISSUING A FINAL NEGATIVE REPORT   Report Status PENDING   Incomplete  CULTURE, BLOOD (ROUTINE X 2)     Status: None   Collection Time    04/17/12  3:00 AM      Result Value Range Status   Specimen Description BLOOD LEFT HAND   Final   Special Requests BOTTLES DRAWN AEROBIC ONLY 5CC   Final   Culture  Setup Time 04/17/2012 08:36   Final   Culture     Final   Value:        BLOOD CULTURE RECEIVED NO GROWTH TO DATE CULTURE WILL BE HELD FOR 5 DAYS BEFORE ISSUING  A FINAL NEGATIVE REPORT   Report Status PENDING   Incomplete     Studies: Dg Chest 2 View  04/16/2012  *RADIOLOGY REPORT*  Clinical Data: Confusion.  History of CHF, diabetes, hypertension, pulmonary fibrosis.  Nonsmoker.  CHEST - 2 VIEW  Comparison: 04/29/2011  Findings: Normal heart size and pulmonary vascularity.  Vague interstitial changes in the lungs may represent chronic bronchitic changes or fibrosis.  No focal consolidation or airspace disease in the lungs.  No blunting of costophrenic angles.  No pneumothorax. Mediastinal contours appear intact.  Stable appearance since previous study.  IMPRESSION: Fibrosis and chronic bronchitic changes in the lungs.  No evidence of active pulmonary disease.   Original Report Authenticated By: Burman Nieves, M.D.    Ct Head Wo Contrast  04/17/2012  *RADIOLOGY REPORT*  Clinical Data: Increased confusion.  No injury.  CT HEAD WITHOUT CONTRAST  Technique:  Contiguous axial images were obtained from the base of the skull through the vertex without contrast.  Comparison: CT limited sinuses 04/18/2011  Findings: Mild diffuse cerebral atrophy.  Patchy low attenuation changes in the deep white matter consistent with small vessel ischemia.  No ventricular dilatation.  No mass effect or midline shift.  No abnormal extra-axial fluid collections.  Gray-white matter junctions are distinct.  Basal cisterns are not effaced. No evidence of acute intracranial hemorrhage.  No depressed skull fractures. Mucosal membrane thickening and opacification of maxillary antra, ethmoid air cells, and frontal sinuses bilaterally, similar to previous study.  IMPRESSION: No acute intracranial abnormalities.   Original Report Authenticated By: Burman Nieves, M.D.     Scheduled Meds: . albuterol  2 puff Inhalation Q6H  . budesonide-formoterol  2 puff Inhalation BID  . citalopram  40 mg Oral QPM  . divalproex  1,000 mg Oral QPM  . docusate sodium  100 mg Oral BID  . fluticasone  2  spray Each Nare Daily  . insulin aspart  0-15 Units Subcutaneous TID WC  . insulin aspart  0-5 Units Subcutaneous QHS  . insulin detemir  30 Units Subcutaneous Q breakfast  .  levofloxacin (LEVAQUIN) IV  750 mg Intravenous Q24H  . pantoprazole  40 mg Oral Daily  . polyethylene glycol  17 g Oral Daily  . predniSONE  50 mg Oral Q breakfast  . rOPINIRole  3 mg Oral BID  . sodium chloride  3 mL Intravenous Q12H  . traMADol  50 mg Oral TID   Continuous Infusions:   Active Problems:   Diabetes mellitus type 2, controlled   INTERSTITIAL LUNG DISEASE   PULMONARY SARCOIDOSIS   PULMONARY HYPERTENSION   Rectal bleeding   Metabolic encephalopathy   Fever   Dehydration with hyponatremia   Acute-on-chronic respiratory failure   Hypoxia   CKD (chronic kidney disease) stage 2, GFR 60-89 ml/min   Abnormal TSH   Chest pain, unspecified   Acute bronchitis  [place]  [facility] Pager [phone]. If 8PM-8AM, please contact night-coverage at [email or web address], password [facility] 04/18/2012, 1:21 PM  LOS: 2 days    Attending Patient seen and examined, agree with the asssessment and plan as outlined above  S Ghimire

## 2012-04-19 LAB — CBC
MCH: 30.3 pg (ref 26.0–34.0)
MCV: 90.4 fL (ref 78.0–100.0)
Platelets: 221 10*3/uL (ref 150–400)
RBC: 3.53 MIL/uL — ABNORMAL LOW (ref 3.87–5.11)
RDW: 13.3 % (ref 11.5–15.5)
WBC: 7.7 10*3/uL (ref 4.0–10.5)

## 2012-04-19 MED ORDER — FUROSEMIDE 40 MG PO TABS: 40.0000 mg | ORAL_TABLET | Freq: Every day | ORAL | Status: DC

## 2012-04-19 MED ORDER — PREDNISONE 10 MG PO TABS: ORAL_TABLET | ORAL | Status: DC

## 2012-04-19 MED ORDER — ALBUTEROL SULFATE HFA 108 (90 BASE) MCG/ACT IN AERS
2.0000 | INHALATION_SPRAY | Freq: Two times a day (BID) | RESPIRATORY_TRACT | Status: DC
  Administered 2012-04-19: 2 via RESPIRATORY_TRACT

## 2012-04-19 MED ORDER — WHITE PETROLATUM GEL
Status: AC
  Administered 2012-04-19: 0.2
  Filled 2012-04-19: qty 5

## 2012-04-19 MED ORDER — CITALOPRAM HYDROBROMIDE 40 MG PO TABS: 20.0000 mg | ORAL_TABLET | Freq: Every evening | ORAL | Status: DC

## 2012-04-19 MED ORDER — LEVOFLOXACIN 750 MG PO TABS: 750.0000 mg | ORAL_TABLET | Freq: Every day | ORAL | Status: DC

## 2012-04-19 NOTE — Progress Notes (Signed)
Penny Curry to be D/C'd Home per MD order.  Discussed with the patient and all questions fully answered.    Medication List    STOP taking these medications       cefdinir 300 MG capsule  Commonly known as:  OMNICEF     spironolactone 25 MG tablet  Commonly known as:  ALDACTONE      TAKE these medications       albuterol 108 (90 BASE) MCG/ACT inhaler  Commonly known as:  PROVENTIL HFA;VENTOLIN HFA  Inhale 2 puffs into the lungs every 6 (six) hours as needed for wheezing or shortness of breath.     budesonide-formoterol 160-4.5 MCG/ACT inhaler  Commonly known as:  SYMBICORT  Inhale 2 puffs into the lungs 2 (two) times daily.     citalopram 40 MG tablet  Commonly known as:  CELEXA  Take 0.5 tablets (20 mg total) by mouth every evening.     divalproex 500 MG DR tablet  Commonly known as:  DEPAKOTE  Take 1,000 mg by mouth every evening.     fluticasone 50 MCG/ACT nasal spray  Commonly known as:  FLONASE  Place 2 sprays into the nose daily.     furosemide 40 MG tablet  Commonly known as:  LASIX  Take 1 tablet (40 mg total) by mouth daily before breakfast.     LEVEMIR FLEXPEN 100 UNIT/ML injection  Generic drug:  insulin detemir  Inject 30 Units into the skin daily with breakfast.     levofloxacin 750 MG tablet  Commonly known as:  LEVAQUIN  Take 1 tablet (750 mg total) by mouth daily.     NOVOLOG FLEXPEN 100 UNIT/ML injection  Generic drug:  insulin aspart  Inject 5 Units into the skin 3 (three) times daily before meals.     omeprazole 20 MG capsule  Commonly known as:  PRILOSEC  Take 20 mg by mouth daily.     potassium chloride 10 MEQ CR tablet  Commonly known as:  KLOR-CON  Take 20 mEq by mouth daily before breakfast.     predniSONE 10 MG tablet  Commonly known as:  DELTASONE  Take 4 tablets for 2 days, then,  Take 3 tablets for 2 days, then,   Take 2 tablets for 2 days, then,  Take 1 tablet for 1 day and then stop     rOPINIRole 3 MG tablet   Commonly known as:  REQUIP  Take 3 mg by mouth 2 (two) times daily.     traMADol 50 MG tablet  Commonly known as:  ULTRAM  Take 50 mg by mouth 3 (three) times daily.        VVS, Skin clean, dry and intact without evidence of skin break down, no evidence of skin tears noted. IV catheter discontinued intact. Site without signs and symptoms of complications. Dressing and pressure applied.  An After Visit Summary was printed and given to the patient. Follow up appointments , new prescriptions and medication administration times given. Education given on bronchitis with handout Patient escorted via WC, and D/C home via private auto.  Cindra Eves, RN 04/19/2012 11:26 AM

## 2012-04-19 NOTE — Discharge Summary (Signed)
PATIENT DETAILS Name: Penny Curry Age: 72 y.o. Sex: female Date of Birth: 1940-04-20 MRN: [id number]. Admit Date: 04/16/2012 Admitting Physician: Therisa Doyne, MD WUJ:WJXB,JYNWGN S, MD  Recommendations for Outpatient Follow-up:  1. General Health Maintenance 2. GI work up for Anemia  PRIMARY DISCHARGE DIAGNOSIS:  Active Problems:   Diabetes mellitus type 2, controlled   INTERSTITIAL LUNG DISEASE   PULMONARY SARCOIDOSIS   PULMONARY HYPERTENSION   Rectal bleeding   Metabolic encephalopathy   Fever   Dehydration with hyponatremia   Acute-on-chronic respiratory failure   Hypoxia   CKD (chronic kidney disease) stage 2, GFR 60-89 ml/min   Abnormal TSH   Chest pain, unspecified   Acute bronchitis      PAST MEDICAL HISTORY: Past Medical History  Diagnosis Date  . HTN (hypertension)   . Diabetes mellitus   . Chronic headache     migraines  . Fibrosis of lung   . CHF (congestive heart failure)   . GERD (gastroesophageal reflux disease)     DISCHARGE MEDICATIONS:   Medication List    STOP taking these medications       cefdinir 300 MG capsule  Commonly known as:  OMNICEF     spironolactone 25 MG tablet  Commonly known as:  ALDACTONE      TAKE these medications       albuterol 108 (90 BASE) MCG/ACT inhaler  Commonly known as:  PROVENTIL HFA;VENTOLIN HFA  Inhale 2 puffs into the lungs every 6 (six) hours as needed for wheezing or shortness of breath.     budesonide-formoterol 160-4.5 MCG/ACT inhaler  Commonly known as:  SYMBICORT  Inhale 2 puffs into the lungs 2 (two) times daily.     citalopram 40 MG tablet  Commonly known as:  CELEXA  Take 0.5 tablets (20 mg total) by mouth every evening.     divalproex 500 MG DR tablet  Commonly known as:  DEPAKOTE  Take 1,000 mg by mouth every evening.     fluticasone 50 MCG/ACT nasal spray  Commonly known as:  FLONASE  Place 2 sprays into the nose daily.     furosemide 40 MG tablet  Commonly known as:  LASIX   Take 1 tablet (40 mg total) by mouth daily before breakfast.     LEVEMIR FLEXPEN 100 UNIT/ML injection  Generic drug:  insulin detemir  Inject 30 Units into the skin daily with breakfast.     levofloxacin 750 MG tablet  Commonly known as:  LEVAQUIN  Take 1 tablet (750 mg total) by mouth daily.     NOVOLOG FLEXPEN 100 UNIT/ML injection  Generic drug:  insulin aspart  Inject 5 Units into the skin 3 (three) times daily before meals.     omeprazole 20 MG capsule  Commonly known as:  PRILOSEC  Take 20 mg by mouth daily.     potassium chloride 10 MEQ CR tablet  Commonly known as:  KLOR-CON  Take 20 mEq by mouth daily before breakfast.     predniSONE 10 MG tablet  Commonly known as:  DELTASONE  Take 4 tablets for 2 days, then,  Take 3 tablets for 2 days, then,   Take 2 tablets for 2 days, then,  Take 1 tablet for 1 day and then stop     rOPINIRole 3 MG tablet  Commonly known as:  REQUIP  Take 3 mg by mouth 2 (two) times daily.     traMADol 50 MG tablet  Commonly known as:  ULTRAM  Take 50  mg by mouth 3 (three) times daily.         BRIEF HPI:  See H&P, Labs, Consult and Test reports for all details in brief, patient is a 72 year old female with a history of fibroses of lung, diabetes who present to the hospital with cough fever and confusion. Patient has chronic respiratory failure and is on home O2. She is also complaining of some mild shortness of breath.  CONSULTATIONS:   None  PERTINENT RADIOLOGIC STUDIES: Dg Chest 2 View  04/16/2012  *RADIOLOGY REPORT*  Clinical Data: Confusion.  History of CHF, diabetes, hypertension, pulmonary fibrosis.  Nonsmoker.  CHEST - 2 VIEW  Comparison: 04/29/2011  Findings: Normal heart size and pulmonary vascularity.  Vague interstitial changes in the lungs may represent chronic bronchitic changes or fibrosis.  No focal consolidation or airspace disease in the lungs.  No blunting of costophrenic angles.  No pneumothorax. Mediastinal  contours appear intact.  Stable appearance since previous study.  IMPRESSION: Fibrosis and chronic bronchitic changes in the lungs.  No evidence of active pulmonary disease.   Original Report Authenticated By: Burman Nieves, M.D.    Ct Head Wo Contrast  04/17/2012  *RADIOLOGY REPORT*  Clinical Data: Increased confusion.  No injury.  CT HEAD WITHOUT CONTRAST  Technique:  Contiguous axial images were obtained from the base of the skull through the vertex without contrast.  Comparison: CT limited sinuses 04/18/2011  Findings: Mild diffuse cerebral atrophy.  Patchy low attenuation changes in the deep white matter consistent with small vessel ischemia.  No ventricular dilatation.  No mass effect or midline shift.  No abnormal extra-axial fluid collections.  Gray-white matter junctions are distinct.  Basal cisterns are not effaced. No evidence of acute intracranial hemorrhage.  No depressed skull fractures. Mucosal membrane thickening and opacification of maxillary antra, ethmoid air cells, and frontal sinuses bilaterally, similar to previous study.  IMPRESSION: No acute intracranial abnormalities.   Original Report Authenticated By: Burman Nieves, M.D.      PERTINENT LAB RESULTS: CBC:  Recent Labs  04/18/12 0605 04/19/12 0500  WBC 7.4 7.7  HGB 10.6* 10.7*  HCT 31.4* 31.9*  PLT 198 221   CMET CMP     Component Value Date/Time   NA 136 04/18/2012 0605   K 4.8 04/18/2012 0605   CL 100 04/18/2012 0605   CO2 30 04/18/2012 0605   GLUCOSE 121* 04/18/2012 0605   BUN 16 04/18/2012 0605   CREATININE 0.77 04/18/2012 0605   CALCIUM 9.3 04/18/2012 0605   PROT 7.6 04/17/2012 0245   ALBUMIN 3.2* 04/17/2012 0245   AST 20 04/17/2012 0245   ALT 17 04/17/2012 0245   ALKPHOS 84 04/17/2012 0245   BILITOT 0.3 04/17/2012 0245   GFRNONAA 83* 04/18/2012 0605   GFRAA >90 04/18/2012 0605    GFR Estimated Creatinine Clearance: 71.6 ml/min (by C-G formula based on Cr of 0.77). No results found for this basename:  LIPASE, AMYLASE,  in the last 72 hours  Recent Labs  04/17/12 0245 04/17/12 0805 04/17/12 1324  TROPONINI <0.30 <0.30 <0.30   No components found with this basename: POCBNP,  No results found for this basename: DDIMER,  in the last 72 hours  Recent Labs  04/17/12 0245  HGBA1C 6.4*   No results found for this basename: CHOL, HDL, LDLCALC, TRIG, CHOLHDL, LDLDIRECT,  in the last 72 hours  Recent Labs  04/17/12 0245  TSH 5.698*   No results found for this basename: VITAMINB12, FOLATE, FERRITIN, TIBC, IRON, RETICCTPCT,  in  the last 72 hours Coags: No results found for this basename: PT, INR,  in the last 72 hours Microbiology: Recent Results (from the past 240 hour(s))  MRSA PCR SCREENING     Status: None   Collection Time    04/17/12  1:11 AM      Result Value Range Status   MRSA by PCR NEGATIVE  NEGATIVE Final   Comment:            The GeneXpert MRSA Assay (FDA     approved for NASAL specimens     only), is one component of a     comprehensive MRSA colonization     surveillance program. It is not     intended to diagnose MRSA     infection nor to guide or     monitor treatment for     MRSA infections.  CULTURE, BLOOD (ROUTINE X 2)     Status: None   Collection Time    04/17/12  2:45 AM      Result Value Range Status   Specimen Description BLOOD LEFT ARM   Final   Special Requests BOTTLES DRAWN AEROBIC ONLY 10CC   Final   Culture  Setup Time 04/17/2012 08:36   Final   Culture     Final   Value:        BLOOD CULTURE RECEIVED NO GROWTH TO DATE CULTURE WILL BE HELD FOR 5 DAYS BEFORE ISSUING A FINAL NEGATIVE REPORT   Report Status PENDING   Incomplete  CULTURE, BLOOD (ROUTINE X 2)     Status: None   Collection Time    04/17/12  3:00 AM      Result Value Range Status   Specimen Description BLOOD LEFT HAND   Final   Special Requests BOTTLES DRAWN AEROBIC ONLY 5CC   Final   Culture  Setup Time 04/17/2012 08:36   Final   Culture     Final   Value:        BLOOD  CULTURE RECEIVED NO GROWTH TO DATE CULTURE WILL BE HELD FOR 5 DAYS BEFORE ISSUING A FINAL NEGATIVE REPORT   Report Status PENDING   Incomplete     BRIEF HOSPITAL COURSE:  Acute encephalopathy - Patient did present with fever and confusion. Suspect this is acute encephalopathy from possible pneumonia/acute bronchitis. CT of the head was negative. She was treated with antibiotics and this has completely resolved. Patient is back to her usual baseline.  Fever with cough - Suspect this is secondary to either acute bronchitis or pneumonia that was not visible on x-ray - Patient was admitted and placed on Levaquin, she has now been persistently afebrile for more than 48 hours. Blood cultures were drawn on 2/27 and they remain negative to date. - Should she is clinically much improved, is afebrile and altered mental status has completely resolved, she is being discharged home to continue further therapy with Levaquin in the outpatient setting - She will also be placed on a short steroid taper  Recent rectal bleeding (prior to admission) - Patient will follow with [facility] for colonoscopy-she has been asked to call [facility] gastroenterology next week. - Hemoglobin remained stable - She has had no rectal bleeding while inpatient  Diabetes - Continue with Levemir  Hypertension - Controlled - Will stop Aldactone, have decreased Lasix to 40 mg  TODAY-DAY OF DISCHARGE:  Subjective:   Lakyla Camilli today has no headache,no chest abdominal pain,no new weakness tingling or numbness, feels much better wants  to go home today.  Objective:   Blood pressure 129/68, pulse 87, temperature 97.7 F (36.5 C), temperature source Oral, resp. rate 18, height 5\' 5"  (1.651 m), weight 87.7 kg (193 lb 5.5 oz), SpO2 97.00%.  Intake/Output Summary (Last 24 hours) at 04/19/12 1016 Last data filed at 04/18/12 2307  Gross per 24 hour  Intake    930 ml  Output      0 ml  Net    930 ml     Exam Awake Alert, Oriented *3, No new F.N deficits, Normal affect [place].AT,PERRAL Supple Neck,No JVD, No cervical lymphadenopathy appriciated.  Symmetrical Chest wall movement, Good air movement bilaterally, CTAB RRR,No Gallops,Rubs or new Murmurs, No Parasternal Heave +ve B.Sounds, Abd Soft, Non tender, No organomegaly appriciated, No rebound -guarding or rigidity. No Cyanosis, Clubbing or edema, No new Rash or bruise  DISCHARGE CONDITION: Stable  DISPOSITION: HOME  DISCHARGE INSTRUCTIONS:    Activity:  As tolerated   Diet recommendation: Diabetic Diet Heart Healthy diet  Follow-up Information   Follow up with HOLT,LYNLEY S, MD. Schedule an appointment as soon as possible for a visit in 1 week.   Contact information:   [address] [place] [address] [phone]       Follow up with Rob Bunting, MD. Schedule an appointment as soon as possible for a visit in 1 week.   Contact information:   [address] [place] [address] [phone]         Total Time spent on discharge equals 45 minutes.  SignedJeoffrey Massed 04/19/2012 10:16 AM

## 2012-04-23 LAB — CULTURE, BLOOD (ROUTINE X 2): Culture: NO GROWTH

## 2012-04-24 ENCOUNTER — Telehealth: Payer: Self-pay

## 2012-04-24 NOTE — Telephone Encounter (Signed)
Message copied by Donata Duff on Thu Apr 24, 2012  8:43 AM ------      Message from: Donata Duff      Created: Thu Apr 17, 2012  8:14 AM       Patty,      Can you call her next week about timing of reshceduling her colo/egd at [facility] with propofol.                        Meryl Dare, MD,FACG at 04/16/2012  6:18 PM         Status: Signed                                  On call note @ 1720. Pt scheduled for colon and egd tomorrow. Pt has a fever and chills and is going to urgent care now to be evaluated. Uses home O2. Will cancel bowel prep and procedures for tomorrow. Pt can reschedule when her acute illness has resolved.               ------

## 2012-04-30 ENCOUNTER — Other Ambulatory Visit: Payer: Self-pay

## 2012-04-30 NOTE — Telephone Encounter (Signed)
05/15/12 1 pm previsit for endo colon [facility] on 05/22/12 930 am need to send pre visit letter

## 2012-04-30 NOTE — Telephone Encounter (Signed)
Pre visit letter mailed and pt aware

## 2012-04-30 NOTE — Telephone Encounter (Signed)
Pt will be scheduled for 05/22/12 930 am

## 2012-05-05 ENCOUNTER — Ambulatory Visit (INDEPENDENT_AMBULATORY_CARE_PROVIDER_SITE_OTHER): Payer: Medicare Other | Admitting: Family Medicine

## 2012-05-05 ENCOUNTER — Ambulatory Visit: Payer: Medicare Other

## 2012-05-05 ENCOUNTER — Inpatient Hospital Stay (HOSPITAL_COMMUNITY): Admission: AD | Admit: 2012-05-05 | Payer: Medicare Other | Source: Ambulatory Visit | Admitting: Critical Care Medicine

## 2012-05-05 ENCOUNTER — Inpatient Hospital Stay (HOSPITAL_COMMUNITY)
Admission: AD | Admit: 2012-05-05 | Discharge: 2012-05-08 | DRG: 314 | Disposition: A | Discharge status: Home / Self Care | Payer: Medicare Other | Source: Ambulatory Visit | Attending: Critical Care Medicine | Admitting: Critical Care Medicine

## 2012-05-05 ENCOUNTER — Telehealth: Payer: Self-pay | Admitting: Emergency Medicine

## 2012-05-05 VITALS — BP 154/68 | HR 91 | Temp 99.0°F | Resp 20 | Ht 64.0 in | Wt 199.0 lb

## 2012-05-05 DIAGNOSIS — D869 Sarcoidosis, unspecified: Secondary | ICD-10-CM

## 2012-05-05 DIAGNOSIS — K219 Gastro-esophageal reflux disease without esophagitis: Secondary | ICD-10-CM | POA: Diagnosis present

## 2012-05-05 DIAGNOSIS — R509 Fever, unspecified: Secondary | ICD-10-CM

## 2012-05-05 DIAGNOSIS — Z792 Long term (current) use of antibiotics: Secondary | ICD-10-CM

## 2012-05-05 DIAGNOSIS — R05 Cough: Secondary | ICD-10-CM

## 2012-05-05 DIAGNOSIS — N182 Chronic kidney disease, stage 2 (mild): Secondary | ICD-10-CM

## 2012-05-05 DIAGNOSIS — Z794 Long term (current) use of insulin: Secondary | ICD-10-CM

## 2012-05-05 DIAGNOSIS — Z90710 Acquired absence of both cervix and uterus: Secondary | ICD-10-CM

## 2012-05-05 DIAGNOSIS — Z880 Allergy status to penicillin: Secondary | ICD-10-CM

## 2012-05-05 DIAGNOSIS — I2699 Other pulmonary embolism without acute cor pulmonale: Secondary | ICD-10-CM | POA: Diagnosis present

## 2012-05-05 DIAGNOSIS — R0602 Shortness of breath: Secondary | ICD-10-CM

## 2012-05-05 DIAGNOSIS — I279 Pulmonary heart disease, unspecified: Principal | ICD-10-CM

## 2012-05-05 DIAGNOSIS — J99 Respiratory disorders in diseases classified elsewhere: Secondary | ICD-10-CM | POA: Diagnosis present

## 2012-05-05 DIAGNOSIS — Z881 Allergy status to other antibiotic agents status: Secondary | ICD-10-CM

## 2012-05-05 DIAGNOSIS — G43909 Migraine, unspecified, not intractable, without status migrainosus: Secondary | ICD-10-CM | POA: Diagnosis present

## 2012-05-05 DIAGNOSIS — Z79899 Other long term (current) drug therapy: Secondary | ICD-10-CM

## 2012-05-05 DIAGNOSIS — E119 Type 2 diabetes mellitus without complications: Secondary | ICD-10-CM

## 2012-05-05 DIAGNOSIS — I2789 Other specified pulmonary heart diseases: Secondary | ICD-10-CM | POA: Diagnosis present

## 2012-05-05 DIAGNOSIS — Z87891 Personal history of nicotine dependence: Secondary | ICD-10-CM

## 2012-05-05 DIAGNOSIS — R0902 Hypoxemia: Secondary | ICD-10-CM

## 2012-05-05 DIAGNOSIS — I129 Hypertensive chronic kidney disease with stage 1 through stage 4 chronic kidney disease, or unspecified chronic kidney disease: Secondary | ICD-10-CM | POA: Diagnosis present

## 2012-05-05 DIAGNOSIS — I509 Heart failure, unspecified: Secondary | ICD-10-CM | POA: Diagnosis present

## 2012-05-05 LAB — POCT CBC
Granulocyte percent: 57.1 %G (ref 37–80)
Lymph, poc: 2.1 (ref 0.6–3.4)
MCHC: 30.9 g/dL — AB (ref 31.8–35.4)
MID (cbc): 0.7 (ref 0–0.9)
POC Granulocyte: 3.8 (ref 2–6.9)
POC LYMPH PERCENT: 32 %L (ref 10–50)
POC MID %: 10.9 %M (ref 0–12)
Platelet Count, POC: 267 10*3/uL (ref 142–424)
RDW, POC: 14.7 %

## 2012-05-05 LAB — CBC WITH DIFFERENTIAL/PLATELET
Basophils Absolute: 0 10*3/uL (ref 0.0–0.1)
Basophils Relative: 0 % (ref 0–1)
Hemoglobin: 10.9 g/dL — ABNORMAL LOW (ref 12.0–15.0)
MCHC: 33.9 g/dL (ref 30.0–36.0)
Neutro Abs: 3.8 10*3/uL (ref 1.7–7.7)
Neutrophils Relative %: 56 % (ref 43–77)
Platelets: 204 10*3/uL (ref 150–400)
RDW: 13.5 % (ref 11.5–15.5)

## 2012-05-05 LAB — GLUCOSE, POCT (MANUAL RESULT ENTRY): POC Glucose: 104 mg/dl — AB (ref 70–99)

## 2012-05-05 LAB — GLUCOSE, CAPILLARY: Glucose-Capillary: 135 mg/dL — ABNORMAL HIGH (ref 70–99)

## 2012-05-05 LAB — COMPREHENSIVE METABOLIC PANEL
ALT: 15 U/L (ref 0–35)
Alkaline Phosphatase: 76 U/L (ref 39–117)
CO2: 30 mEq/L (ref 19–32)
Calcium: 9 mg/dL (ref 8.4–10.5)
GFR calc Af Amer: 83 mL/min — ABNORMAL LOW (ref >90)
GFR calc non Af Amer: 72 mL/min — ABNORMAL LOW (ref >90)
Glucose, Bld: 149 mg/dL — ABNORMAL HIGH (ref 70–99)
Sodium: 136 mEq/L (ref 135–145)

## 2012-05-05 MED ORDER — ROPINIROLE HCL 1 MG PO TABS
3.0000 mg | ORAL_TABLET | Freq: Two times a day (BID) | ORAL | Status: DC
  Administered 2012-05-05 – 2012-05-08 (×6): 3 mg via ORAL
  Filled 2012-05-05 (×8): qty 3

## 2012-05-05 MED ORDER — ACETAMINOPHEN 325 MG PO TABS: 650.0000 mg | ORAL_TABLET | Freq: Four times a day (QID) | ORAL | Status: DC | PRN

## 2012-05-05 MED ORDER — TRAMADOL HCL 50 MG PO TABS
50.0000 mg | ORAL_TABLET | Freq: Three times a day (TID) | ORAL | Status: DC
  Administered 2012-05-05 – 2012-05-08 (×8): 50 mg via ORAL
  Filled 2012-05-05 (×8): qty 1

## 2012-05-05 MED ORDER — ALBUTEROL SULFATE (5 MG/ML) 0.5% IN NEBU: 2.5000 mg | INHALATION_SOLUTION | RESPIRATORY_TRACT | Status: DC | PRN

## 2012-05-05 MED ORDER — INSULIN ASPART 100 UNIT/ML ~~LOC~~ SOLN: 0.0000 [IU] | Freq: Every day | SUBCUTANEOUS | Status: DC

## 2012-05-05 MED ORDER — IPRATROPIUM BROMIDE 0.02 % IN SOLN
0.5000 mg | RESPIRATORY_TRACT | Status: DC
  Administered 2012-05-05 – 2012-05-08 (×15): 0.5 mg via RESPIRATORY_TRACT
  Filled 2012-05-05 (×16): qty 2.5

## 2012-05-05 MED ORDER — DIVALPROEX SODIUM 500 MG PO DR TAB
1000.0000 mg | DELAYED_RELEASE_TABLET | Freq: Every evening | ORAL | Status: DC
  Administered 2012-05-05 – 2012-05-07 (×3): 1000 mg via ORAL
  Filled 2012-05-05 (×4): qty 2

## 2012-05-05 MED ORDER — FLUTICASONE PROPIONATE 50 MCG/ACT NA SUSP
2.0000 | Freq: Every day | NASAL | Status: DC
  Administered 2012-05-06 – 2012-05-08 (×3): 2 via NASAL
  Filled 2012-05-05: qty 16

## 2012-05-05 MED ORDER — PANTOPRAZOLE SODIUM 40 MG PO TBEC
40.0000 mg | DELAYED_RELEASE_TABLET | Freq: Every day | ORAL | Status: DC
  Administered 2012-05-06 – 2012-05-08 (×3): 40 mg via ORAL
  Filled 2012-05-05 (×3): qty 1

## 2012-05-05 MED ORDER — ALBUTEROL SULFATE (5 MG/ML) 0.5% IN NEBU
2.5000 mg | INHALATION_SOLUTION | RESPIRATORY_TRACT | Status: DC
  Administered 2012-05-05 – 2012-05-08 (×15): 2.5 mg via RESPIRATORY_TRACT
  Filled 2012-05-05 (×16): qty 0.5

## 2012-05-05 MED ORDER — INSULIN ASPART 100 UNIT/ML ~~LOC~~ SOLN
0.0000 [IU] | Freq: Three times a day (TID) | SUBCUTANEOUS | Status: DC
  Administered 2012-05-06: 5 [IU] via SUBCUTANEOUS
  Administered 2012-05-06: 2 [IU] via SUBCUTANEOUS
  Administered 2012-05-06: 3 [IU] via SUBCUTANEOUS
  Administered 2012-05-07: 2 [IU] via SUBCUTANEOUS
  Administered 2012-05-07: 8 [IU] via SUBCUTANEOUS
  Administered 2012-05-08: 3 [IU] via SUBCUTANEOUS
  Administered 2012-05-08: 2 [IU] via SUBCUTANEOUS

## 2012-05-05 MED ORDER — INSULIN ASPART 100 UNIT/ML ~~LOC~~ SOLN
5.0000 [IU] | Freq: Three times a day (TID) | SUBCUTANEOUS | Status: DC
  Administered 2012-05-06 – 2012-05-08 (×8): 5 [IU] via SUBCUTANEOUS

## 2012-05-05 MED ORDER — IPRATROPIUM BROMIDE 0.02 % IN SOLN: 0.5000 mg | RESPIRATORY_TRACT | Status: DC | PRN

## 2012-05-05 MED ORDER — HEPARIN SODIUM (PORCINE) 5000 UNIT/ML IJ SOLN
5000.0000 [IU] | Freq: Three times a day (TID) | INTRAMUSCULAR | Status: DC
  Administered 2012-05-05 – 2012-05-08 (×8): 5000 [IU] via SUBCUTANEOUS
  Filled 2012-05-05 (×11): qty 1

## 2012-05-05 MED ORDER — FUROSEMIDE 10 MG/ML IJ SOLN
40.0000 mg | Freq: Two times a day (BID) | INTRAMUSCULAR | Status: AC
  Administered 2012-05-05 – 2012-05-07 (×4): 40 mg via INTRAVENOUS
  Filled 2012-05-05 (×5): qty 4

## 2012-05-05 MED ORDER — CITALOPRAM HYDROBROMIDE 20 MG PO TABS
20.0000 mg | ORAL_TABLET | Freq: Every evening | ORAL | Status: DC
  Administered 2012-05-05 – 2012-05-07 (×3): 20 mg via ORAL
  Filled 2012-05-05 (×4): qty 1

## 2012-05-05 MED ORDER — BUDESONIDE-FORMOTEROL FUMARATE 160-4.5 MCG/ACT IN AERO
2.0000 | INHALATION_SPRAY | Freq: Two times a day (BID) | RESPIRATORY_TRACT | Status: DC
  Administered 2012-05-05 – 2012-05-08 (×6): 2 via RESPIRATORY_TRACT
  Filled 2012-05-05: qty 6

## 2012-05-05 MED ORDER — INFLUENZA VIRUS VACC SPLIT PF IM SUSP
0.5000 mL | INTRAMUSCULAR | Status: AC
  Administered 2012-05-06: 0.5 mL via INTRAMUSCULAR
  Filled 2012-05-05: qty 0.5

## 2012-05-05 NOTE — Patient Instructions (Addendum)
Please proceed to the admitting office at [facility].  You will be admitted to the pulmonology service.  I have spoken with the doctor on call for [facility] pulmonology

## 2012-05-05 NOTE — H&P (Signed)
PULMONARY  / CRITICAL CARE MEDICINE  Name: Penny Curry MRN: [id number] DOB: [birth year]    ADMISSION DATE:  05/05/2012 CONSULTATION DATE:  2/17 2014  REFERRING MD :  Direct admission   CHIEF COMPLAINT:  Dyspnea  BRIEF PATIENT DESCRIPTION: 72 yo with pulmonary sarcoidosis and pulmonary hypertension ( followed by Dr. Delton Coombes ) with progressive dyspnea and weight gain sent from Urgent Care for further evaluation.   SIGNIFICANT EVENTS / STUDIES:  3/17  Admitted for evaluation of dyspnea  LINES / TUBES:  CULTURES:  ANTIBIOTICS:  HISTORY OF PRESENT ILLNESS:  72 yo with pulmonary sarcoidosis and pulmonary hypertension ( followed by Dr. Delton Coombes ) with progressive dyspnea and weight gain sent from Urgent Care for further evaluation. At baseline reports minimal dyspnea at rest with some increase on exertion, no orthopnea / paroxysmal nocturnal dyspnea, intermittent cough nocturnal only oxygen use.  Since about 3 weeks ago reports increased dyspnea and frequency of cough, but no clearcut purulent sputum.  Initially had fever and chills and was treated as outpatient with Cefdinir for bronchitis with minimal improvement.  Briefly admitted early this month for sam (no infiltrates on CXR) and treated with Levaquin.  Was doing well after discharge, but then developed dyspnea again and was started on Moxifloxacin by PCP without improvement.  Associated symptoms were paroxysmal nocturnal dyspnea, orthopnea and pedal edema (symmetrical).  She reports gaining about 9 lb since one week ago. All those symptoms were new.  This time she did not have fever, though reports feeling hot.  She also started using oxygen around the clock due to dyspnea.  There was no chest pain or palpitations.  She presented to Urgent Care where her WBC was found to be normal and CXR revealed no focal infiltrates.  She was sent to [facility] for direct admission and further evaluation.  PAST MEDICAL HISTORY :  Past Medical History  Diagnosis Date  .  HTN (hypertension)   . Diabetes mellitus   . Chronic headache     migraines  . Fibrosis of lung   . CHF (congestive heart failure)   . GERD (gastroesophageal reflux disease)    Past Surgical History  Procedure Laterality Date  . Neck surgery  1987  . Partial hysterectomy    . Tubal ligation    . Cataract extraction    . Abdominal hysterectomy     Prior to Admission medications   Medication Sig Start Date End Date Taking? Authorizing Provider  albuterol (PROVENTIL HFA;VENTOLIN HFA) 108 (90 BASE) MCG/ACT inhaler Inhale 2 puffs into the lungs every 6 (six) hours as needed for wheezing or shortness of breath.   Yes Historical Provider, MD  budesonide-formoterol (SYMBICORT) 160-4.5 MCG/ACT inhaler Inhale 2 puffs into the lungs 2 (two) times daily.   Yes Historical Provider, MD  citalopram (CELEXA) 40 MG tablet Take 0.5 tablets (20 mg total) by mouth every evening. 04/19/12  Yes Shanker Levora Dredge, MD  divalproex (DEPAKOTE) 500 MG EC tablet Take 1,000 mg by mouth every evening.    Yes Historical Provider, MD  fluticasone (FLONASE) 50 MCG/ACT nasal spray Place 2 sprays into the nose daily.    Yes Historical Provider, MD  furosemide (LASIX) 40 MG tablet Take 1 tablet (40 mg total) by mouth daily before breakfast. 04/19/12  Yes Shanker Levora Dredge, MD  insulin aspart (NOVOLOG FLEXPEN) 100 UNIT/ML injection Inject 5 Units into the skin 3 (three) times daily before meals.    Yes Historical Provider, MD  insulin detemir (LEVEMIR FLEXPEN) 100 UNIT/ML  injection Inject 30 Units into the skin daily with breakfast.    Yes Historical Provider, MD  levofloxacin (LEVAQUIN) 750 MG tablet Take 750 mg by mouth daily.   Yes Historical Provider, MD  moxifloxacin (AVELOX) 400 MG tablet Take 400 mg by mouth daily.   Yes Historical Provider, MD  omeprazole (PRILOSEC) 20 MG capsule Take 20 mg by mouth 2 (two) times daily.  03/29/12  Yes Leslye Peer, MD  potassium chloride (KLOR-CON) 10 MEQ CR tablet Take 20 mEq by mouth  daily before breakfast.    Yes Historical Provider, MD  rOPINIRole (REQUIP) 3 MG tablet Take 3 mg by mouth 2 (two) times daily.     Yes Historical Provider, MD  traMADol (ULTRAM) 50 MG tablet Take 50 mg by mouth 3 (three) times daily. 05/23/11  Yes Nyoka Cowden, MD   Allergies  Allergen Reactions  . Clindamycin/Lincomycin Swelling and Rash  . Penicillins Rash     high fever   FAMILY HISTORY:  Family History  Problem Relation Age of Onset  . Prostate cancer Brother   . Stroke Mother   . Asthma Son   . Emphysema Father   . Lung cancer Sister    SOCIAL HISTORY:  reports that she quit smoking about 44 years ago. Her smoking use included Cigarettes. She has a 8 pack-year smoking history. She has never used smokeless tobacco. She reports that she does not drink alcohol or use illicit drugs.  REVIEW OF SYSTEMS:  12 points negative except as in HPI  SUBJECTIVE:   VITAL SIGNS: Temp:  [99 F (37.2 C)] 99 F (37.2 C) (03/17 1622) Pulse Rate:  [91] 91 (03/17 1622) Resp:  [20] 20 (03/17 1622) BP: (154)/(68) 154/68 mmHg (03/17 1622) SpO2:  [96 %] 96 % (03/17 1622) Weight:  [90.266 kg (199 lb)] 90.266 kg (199 lb) (03/17 1622)  PHYSICAL EXAMINATION: General:  Resting comfortably, in no distress Neuro:  Awake, alert, oriented HEENT:  PERRL, moist membranes Neck:  Soft, no JVD Cardiovascular:  RRR, no murmurs Lungs:  Diminished air entry, few bibasilar rales Abdomen:  Soft, nontender Musculoskeletal:  No calf tenderness, bilateral 1+ pitting pedal edema Skin:  Intact  No results found for this basename: NA, K, CL, CO2, BUN, CREATININE, GLUCOSE,  in the last 168 hours  Recent Labs Lab 05/05/12 1717  HGB 11.2*  HCT 36.2*  WBC 6.6   Dg Chest 2 View  05/05/2012  *RADIOLOGY REPORT*  Clinical Data: Shortness of breath and fever.  CHEST - 2 VIEW  Comparison: 04/16/2012  Findings: Lungs are adequately inflated without focal consolidation or effusion.  The cardiomediastinal silhouette  and remainder of the exam is unchanged.  IMPRESSION: No acute cardiopulmonary disease.   Original Report Authenticated By: Elberta Fortis, M.D.    ASSESSMENT / PLAN:  Pulmonary sarcoidosis, appears stable without evidence of significant parenchymal involvement or bronchospasm Pulmonary hypertension (last TTE 04/17/12 PAP 53 torr) Cor pulmonale, likely worsening given weight gain and pedal edema Hypoxia Dyspnea, multifactorial Doubt pneumonia given normal WBC and CXR PE is possible but less likely CKD with unknown recent Cr DM  -->  Continue home rx with following exceptions -->  Defer abx -->  Change Lasix to 40 IV bid x 48 h -->  Hold Levemir, do SSI -->  WBC with dif, PCT, repeat CXR in AM -->  TTE, BNP -->  Venous Doppler BLLE, d-dimer, CMP --> if still posibility for PE and Cr allows --> chest CT angio -->  Heparin [place] for DVT Px  Lonia Farber, MD Pulmonary and Critical Care Medicine [facility] Pager: [phone]  05/05/2012, 9:16 PM

## 2012-05-05 NOTE — Telephone Encounter (Signed)
[id number] x 1 

## 2012-05-05 NOTE — Progress Notes (Signed)
[facility] [address], [place] [address] [phone]  Date:  05/05/2012   Name:  Penny Curry   DOB:  1940/04/06   MRN:  [id number]  PCP:  Marylen Ponto, MD    Chief Complaint: Edema, Shortness of Breath and Fever   History of Present Illness:  Penny Curry is a 73 y.o. very pleasant female patient who presents with the following:  She was recently admitted at [facility] with IDDM, exacerbation of her interstitial lung disease/ sarcoid (pulmonary) and pulmonary HTN, acute on chronic respiratory failure, hypoxia, CKD, chest pain and acute bronchitis.  She was released from the hospital on 04/19/12.  She did well for a few days, then began to feel ill again and saw her PCP this past Tuesday.  She was diagnosed with bronchitis and started on avelox.  However, she continues to feel worse.  Over the last 2 days she has become more ill- she has felt fatigued, had a fever (subjective only), and ankle swelling which is new.  She has had to sleep on an extra pillow at night- "I sleep as upright as my neck can stand."  She has not taken any antipyretics today.    She does note a cough as well- the cough can be productive at times.   She does feel short winded- worse than usual.   She does use O2 at home- supposed to use 24/7 but actually uses it off and on- more when her daughter is around to remind her to use it.  3L vis [place].    She has noted more frequent stools, but no vomiting.  Epigastric tenderness which has been present for one month.   She has noted a headache, runny nose.    Her weight is up 9 lbs from her last visit here. (2/26= 190 lbs, today 199 lbs) No known heart failure per her report.  She sees Dr.Byrum at [facility] for her pulmonary HTN/ sarcoid.    She is taking 40 mg of lasix once a day, and states she is still on spironolactone although it is not on her med list.   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 Diabetes mellitus type 2, controlled  . HYPERTENSION  . INTERSTITIAL LUNG DISEASE  . HEADACHE, CHRONIC  . PULMONARY SARCOIDOSIS  . PULMONARY HYPERTENSION  . Allergic rhinitis, seasonal  . Chronic cough  . Sinusitis  . Asthma  . Rectal bleeding  . Dysphagia  . Hemorrhoids  . Metabolic encephalopathy  . Fever  . Dehydration with hyponatremia  . Acute-on-chronic respiratory failure  . Hypoxia  . CKD (chronic kidney disease) stage 2, GFR 60-89 ml/min  . Abnormal TSH  . Chest pain, unspecified  . Acute bronchitis    Past Medical History  Diagnosis Date  . HTN (hypertension)   . Diabetes mellitus   . Chronic headache     migraines  . Fibrosis of lung   . CHF (congestive heart failure)   . GERD (gastroesophageal reflux disease)     Past Surgical History  Procedure Laterality Date  . Neck surgery  1987  . Partial hysterectomy    . Tubal ligation    . Cataract extraction    . Abdominal hysterectomy  History  Substance Use Topics  . Smoking status: Former Smoker -- 1.00 packs/day for 8 years    Types: Cigarettes    Quit date: 02/20/1968  . Smokeless tobacco: Never Used  . Alcohol Use: No    Family History  Problem Relation Age of Onset  . Prostate cancer Brother   . Stroke Mother   . Asthma Son   . Emphysema Father   . Lung cancer Sister     Allergies  Allergen Reactions  . Clindamycin/Lincomycin Swelling and Rash  . Penicillins Rash     high fever    Medication list has been reviewed and updated.  Current Outpatient Prescriptions on File Prior to Visit  Medication Sig Dispense Refill  . albuterol (PROVENTIL HFA;VENTOLIN HFA) 108 (90 BASE) MCG/ACT inhaler Inhale 2 puffs into the lungs every 6 (six) hours as needed for wheezing or shortness of breath.      . citalopram (CELEXA) 40 MG tablet Take  0.5 tablets (20 mg total) by mouth every evening.  30 tablet  0  . divalproex (DEPAKOTE) 500 MG EC tablet Take 1,000 mg by mouth every evening.       . fluticasone (FLONASE) 50 MCG/ACT nasal spray Place 2 sprays into the nose daily.       . furosemide (LASIX) 40 MG tablet Take 1 tablet (40 mg total) by mouth daily before breakfast.  30 tablet  0  . insulin aspart (NOVOLOG FLEXPEN) 100 UNIT/ML injection Inject 5 Units into the skin 3 (three) times daily before meals.       . insulin detemir (LEVEMIR FLEXPEN) 100 UNIT/ML injection Inject 30 Units into the skin daily with breakfast.       . levofloxacin (LEVAQUIN) 750 MG tablet Take 1 tablet (750 mg total) by mouth daily.  4 tablet  0  . omeprazole (PRILOSEC) 20 MG capsule Take 20 mg by mouth daily.       . potassium chloride (KLOR-CON) 10 MEQ CR tablet Take 20 mEq by mouth daily before breakfast.       . predniSONE (DELTASONE) 10 MG tablet Take 4 tablets for 2 days, then, Take 3 tablets for 2 days, then,  Take 2 tablets for 2 days, then, Take 1 tablet for 1 day and then stop  19 tablet  0  . rOPINIRole (REQUIP) 3 MG tablet Take 3 mg by mouth 2 (two) times daily.        . traMADol (ULTRAM) 50 MG tablet Take 50 mg by mouth 3 (three) times daily.      . budesonide-formoterol (SYMBICORT) 160-4.5 MCG/ACT inhaler Inhale 2 puffs into the lungs 2 (two) times daily.       No current facility-administered medications on file prior to visit.    Review of Systems:  As per HPI- otherwise negative. Dose admit to some "chest tightness" which is vague and long- standing. No actual CP per her report.    Physical Examination: Filed Vitals:   05/05/12 1622  BP: 154/68  Pulse: 91  Temp: 99 F (37.2 C)  Resp: 20   Filed Vitals:   05/05/12 1622  Height: 5\' 4"  (1.626 m)  Weight: 199 lb (90.266 kg)   Body mass index is 34.14 kg/(m^2). Ideal Body Weight: Weight in (lb) to have BMI = 25: 145.3  Placed on 3L of O2 via [place]- O2 sat to 98%. Per my CMA's  report, when she first arrived at clinic her O2 was 90%, but improved to 96% after she rested  in exam room   GEN: WDWN, NAD, Non-toxic, A & O x 3, overweight HEENT: Atraumatic, Normocephalic. Neck supple. No masses, No LAD.  Bilateral TM wnl, oropharynx normal.  PEERL,EOMI.   Ears and Nose: No external deformity. CV: RRR, No M/G/R. No JVD. No thrill. No extra heart sounds. PULM: CTA B, no wheezes, crackles, rhonchi. No retractions. No resp. distress. No accessory muscle use. ABD: S,  ND, +BS. No rebound. No HSM. Mild epigastric tenderness  EXTR: No c/c.  1+ edema both ankles  NEURO Normal gait.  PSYCH: Normally interactive. Conversant. Not depressed or anxious appearing.  Calm demeanor.    Results for orders placed in visit on 05/05/12  POCT CBC      Result Value Range   WBC 6.6  4.6 - 10.2 K/uL   Lymph, poc 2.1  0.6 - 3.4   POC LYMPH PERCENT 32.0  10 - 50 %L   MID (cbc) 0.7  0 - 0.9   POC MID % 10.9  0 - 12 %M   POC Granulocyte 3.8  2 - 6.9   Granulocyte percent 57.1  37 - 80 %G   RBC 3.84 (*) 4.04 - 5.48 M/uL   Hemoglobin 11.2 (*) 12.2 - 16.2 g/dL   HCT, POC 16.1 (*) 09.6 - 47.9 %   MCV 94.4  80 - 97 fL   MCH, POC 29.2  27 - 31.2 pg   MCHC 30.9 (*) 31.8 - 35.4 g/dL   RDW, POC 04.5     Platelet Count, POC 267  142 - 424 K/uL   MPV 8.4  0 - 99.8 fL  GLUCOSE, POCT (MANUAL RESULT ENTRY)      Result Value Range   POC Glucose 104 (*) 70 - 99 mg/dl   [facility] reading (PRIMARY) by  Dr. Patsy Lager. CXR: compared to film from 04/16/12- diffuse fibrosis, no definite effusion or infiltrate noted.    ELG: SR, ST depression noted on previous EKGs is less today.    Spoke with Dr. Delford Field, on call for [facility].  He kindly consulted by phone and asked me to send Ms. Farha to [facility] admitting office for admission.    Assessment and Plan: SOB (shortness of breath) - Plan: POCT CBC, EKG 12-Lead, DG Chest 2 View  Type II or unspecified type diabetes mellitus without mention of complication,  not stated as uncontrolled - Plan: POCT glucose (manual entry)  72 year old woman with history of sarcoid, ILD, pulmonary HTN here with worsening SOB, 9 lb weight gain, and recent hospitalization for same. To be admitted for further treatment.  Will drive to hospital in private car- her daughter will drive her.    Abbe Amsterdam, MD

## 2012-05-06 ENCOUNTER — Encounter (HOSPITAL_COMMUNITY): Payer: Self-pay | Admitting: *Deleted

## 2012-05-06 ENCOUNTER — Inpatient Hospital Stay (HOSPITAL_COMMUNITY): Payer: Medicare Other

## 2012-05-06 DIAGNOSIS — R609 Edema, unspecified: Secondary | ICD-10-CM

## 2012-05-06 DIAGNOSIS — J9620 Acute and chronic respiratory failure, unspecified whether with hypoxia or hypercapnia: Secondary | ICD-10-CM

## 2012-05-06 LAB — GLUCOSE, CAPILLARY
Glucose-Capillary: 145 mg/dL — ABNORMAL HIGH (ref 70–99)
Glucose-Capillary: 153 mg/dL — ABNORMAL HIGH (ref 70–99)
Glucose-Capillary: 183 mg/dL — ABNORMAL HIGH (ref 70–99)

## 2012-05-06 LAB — CBC
HCT: 32.1 % — ABNORMAL LOW (ref 36.0–46.0)
Hemoglobin: 10.9 g/dL — ABNORMAL LOW (ref 12.0–15.0)
MCH: 30.5 pg (ref 26.0–34.0)
MCHC: 34 g/dL (ref 30.0–36.0)
MCV: 89.9 fL (ref 78.0–100.0)

## 2012-05-06 LAB — BASIC METABOLIC PANEL
BUN: 14 mg/dL (ref 6–23)
GFR calc non Af Amer: 83 mL/min — ABNORMAL LOW (ref >90)
Glucose, Bld: 175 mg/dL — ABNORMAL HIGH (ref 70–99)
Potassium: 3.9 mEq/L (ref 3.5–5.1)

## 2012-05-06 LAB — INFLUENZA PANEL BY PCR (TYPE A & B)
H1N1 flu by pcr: NOT DETECTED
Influenza B By PCR: NEGATIVE

## 2012-05-06 NOTE — H&P (Signed)
PULMONARY  / CRITICAL CARE MEDICINE  Name: Penny Curry MRN: [id number] DOB: 11/24/1940    ADMISSION DATE:  05/05/2012   REFERRING MD :  Direct admission   CHIEF COMPLAINT:  Dyspnea  BRIEF PATIENT DESCRIPTION: 72 yo with pulmonary sarcoidosis and pulmonary hypertension ( followed by Dr. Delton Coombes ) with progressive dyspnea and weight gain sent from Urgent Care for further evaluation > admit to pccm svc 3/17  SIGNIFICANT EVENTS / STUDIES:  3/17  Admitted for evaluation of dyspnea  LINES / TUBES:  CULTURES: None  ANTIBIOTICS: none   SUBJECTIVE:  nad at rest  VITAL SIGNS: Temp:  [98.3 F (36.8 C)-99 F (37.2 C)] 98.4 F (36.9 C) (03/18 0602) Pulse Rate:  [83-91] 83 (03/18 0602) Resp:  [20] 20 (03/18 0602) BP: (110-154)/(52-68) 119/57 mmHg (03/18 0602) SpO2:  [96 %-100 %] 100 % (03/18 1135) Weight:  [88.089 kg (194 lb 3.2 oz)-90.266 kg (199 lb)] 88.089 kg (194 lb 3.2 oz) (03/18 0602) 02 rx  3lpm  Intake/Output Summary (Last 24 hours) at 05/06/12 1200 Last data filed at 05/06/12 0800  Gross per 24 hour  Intake    240 ml  Output   1800 ml  Net  -1560 ml   PHYSICAL EXAMINATION: General:  Resting comfortably, in no distress Neuro:  Awake, alert, oriented HEENT:  PERRL, moist membranes Neck:  Soft, no JVD Cardiovascular:  RRR, no murmurs Lungs:  Diminished air entry, decreased in bases.   Abdomen:  Soft, nontender Musculoskeletal:  No calf tenderness, bilateral 1+ pitting pedal edema Skin:  Intact   Recent Labs Lab 05/05/12 2145 05/06/12 0600  NA 136 136  K 4.3 3.9  CL 98 95*  CO2 30 30  BUN 14 14  CREATININE 0.81 0.76  GLUCOSE 149* 175*    Recent Labs Lab 05/05/12 1717 05/05/12 2145 05/06/12 0600  HGB 11.2* 10.9* 10.9*  HCT 36.2* 32.2* 32.1*  WBC 6.6 6.8 5.4  PLT  --  204 209   Dg Chest 2 View  05/05/2012  *RADIOLOGY REPORT*  Clinical Data: Shortness of breath and fever.  CHEST - 2 VIEW  Comparison: 04/16/2012  Findings: Lungs are adequately  inflated without focal consolidation or effusion.  The cardiomediastinal silhouette and remainder of the exam is unchanged.  IMPRESSION: No acute cardiopulmonary disease.   Original Report Authenticated By: Elberta Fortis, M.D.    Dg Chest Port 1 View  05/06/2012  *RADIOLOGY REPORT*  Clinical Data: Evaluate sarcoidosis.  PORTABLE CHEST - 1 VIEW  Comparison: 05/05/2012 05/01/2010  Findings: Increased lung markings may represent decreased lung volumes.  Heart and mediastinum are grossly stable.  There is no evidence of focal airspace disease.  IMPRESSION: Prominent lung markings may represent decreased lung volumes and chronic changes.  Difficult to exclude subtle nodular disease.  No acute chest findings or airspace disease.   Original Report Authenticated By: Richarda Overlie, M.D.    Lab Results  Component Value Date   CREATININE 0.76 05/06/2012   CREATININE 0.81 05/05/2012   CREATININE 0.77 04/18/2012    Recent Labs Lab 05/05/12 2145 05/06/12 0600  K 4.3 3.9      ASSESSMENT / PLAN:  Pulmonary sarcoidosis, appears stable without evidence of significant parenchymal involvement or bronchospasm Pulmonary hypertension (last TTE 04/17/12 PAP 53 torr) Cor pulmonale, likely worsening given weight gain and pedal edema Hypoxia Dyspnea, multifactorial, better with diuresis 3-/18 Doubt pneumonia given normal WBC and CXR and pct PE is possible but less likely CKD with unknown recent Cr DM    -->  TTE 04/17/12 LAE >>RAE, mod lvh with nl ef.   >    bnp  132 3/18  -->  Venous Doppler BLLE ordered 3/17 >>>   Nothing else to offer for now, pt improving with diuretic rx  Sandrea Hughs, MD Pulmonary and Critical Care Medicine [facility] Cell [phone] After 5:30 PM or weekends, call [phone]

## 2012-05-06 NOTE — Progress Notes (Signed)
*  Preliminary Results* Bilateral lower extremity venous duplex completed. Bilateral lower extremities are negative for deep vein thrombosis. No evidence of Baker's cyst.  05/06/2012 1:44 PM Gertie Fey, RDMS, RDCS

## 2012-05-06 NOTE — Progress Notes (Signed)
  Echocardiogram 2D Echocardiogram has been performed.  Penny Curry 05/06/2012, 2:14 PM

## 2012-05-06 NOTE — Telephone Encounter (Signed)
Called, spoke with pt's daughter, Inocencio Homes.  Pt was seen at [facility] yesterday and admitted to hospital.  Will sign off.

## 2012-05-07 LAB — CBC
HCT: 32.3 % — ABNORMAL LOW (ref 36.0–46.0)
MCHC: 34.1 g/dL (ref 30.0–36.0)
MCV: 89 fL (ref 78.0–100.0)
Platelets: 215 10*3/uL (ref 150–400)
RDW: 13.6 % (ref 11.5–15.5)

## 2012-05-07 LAB — BASIC METABOLIC PANEL
BUN: 16 mg/dL (ref 6–23)
Calcium: 8.9 mg/dL (ref 8.4–10.5)
Creatinine, Ser: 0.76 mg/dL (ref 0.50–1.10)
GFR calc Af Amer: >90 mL/min (ref >90)
GFR calc non Af Amer: 83 mL/min — ABNORMAL LOW (ref >90)

## 2012-05-07 LAB — GLUCOSE, CAPILLARY
Glucose-Capillary: 193 mg/dL — ABNORMAL HIGH (ref 70–99)
Glucose-Capillary: 126 mg/dL — ABNORMAL HIGH (ref 70–99)

## 2012-05-07 MED ORDER — DOCUSATE SODIUM 100 MG PO CAPS
100.0000 mg | ORAL_CAPSULE | Freq: Every day | ORAL | Status: DC | PRN
  Administered 2012-05-07: 100 mg via ORAL
  Filled 2012-05-07: qty 1

## 2012-05-07 MED ORDER — FUROSEMIDE 40 MG PO TABS
40.0000 mg | ORAL_TABLET | Freq: Every day | ORAL | Status: DC
  Administered 2012-05-08: 40 mg via ORAL
  Filled 2012-05-07: qty 1

## 2012-05-07 MED ORDER — FUROSEMIDE 40 MG PO TABS
40.0000 mg | ORAL_TABLET | Freq: Every day | ORAL | Status: DC
  Filled 2012-05-07: qty 1

## 2012-05-07 MED ORDER — POTASSIUM CHLORIDE CRYS ER 20 MEQ PO TBCR
40.0000 meq | EXTENDED_RELEASE_TABLET | Freq: Two times a day (BID) | ORAL | Status: DC
  Administered 2012-05-07 – 2012-05-08 (×3): 40 meq via ORAL
  Filled 2012-05-07 (×5): qty 2

## 2012-05-07 MED ORDER — POLYETHYLENE GLYCOL 3350 17 G PO PACK
17.0000 g | PACK | Freq: Every day | ORAL | Status: DC | PRN
  Administered 2012-05-07: 17 g via ORAL
  Filled 2012-05-07: qty 1

## 2012-05-07 NOTE — Progress Notes (Signed)
PULMONARY  / CRITICAL CARE MEDICINE  Name: Penny Curry MRN: [id number] DOB: 1941/01/18    ADMISSION DATE:  05/05/2012   REFERRING MD :  Direct admission   CHIEF COMPLAINT:  Dyspnea  BRIEF PATIENT DESCRIPTION: 72 yo with pulmonary sarcoidosis and pulmonary hypertension ( followed by Dr. Delton Coombes ) with progressive dyspnea and weight gain sent from Urgent Care for further evaluation > admit to pccm svc 3/17  SIGNIFICANT EVENTS / STUDIES:  3/17  Admitted for evaluation of dyspnea  LINES / TUBES:  CULTURES: None  ANTIBIOTICS: none   SUBJECTIVE:  nad at rest  VITAL SIGNS: Temp:  [97.9 F (36.6 C)-98.5 F (36.9 C)] 98.1 F (36.7 C) (03/19 0600) Pulse Rate:  [87-99] 87 (03/19 0600) Resp:  [19-20] 19 (03/19 0600) BP: (102-114)/(52) 102/52 mmHg (03/19 0600) SpO2:  [94 %-100 %] 94 % (03/19 0822) 02 rx  2lpm but not wearing consistently   Intake/Output Summary (Last 24 hours) at 05/07/12 0909 Last data filed at 05/07/12 0700  Gross per 24 hour  Intake    720 ml  Output   1650 ml  Net   -930 ml   PHYSICAL EXAMINATION: General:  Resting comfortably, in no distress Neuro:  Awake, alert, oriented HEENT:  PERRL, moist membranes Neck:  Soft, no JVD Cardiovascular:  RRR, no murmurs Lungs:  Diminished air entry, decreased in bases.   Abdomen:  Soft, nontender Musculoskeletal:  No calf tenderness, bilateral 1+ pitting pedal edema Skin:  Intact   Recent Labs Lab 05/05/12 2145 05/06/12 0600 05/07/12 0502  NA 136 136 133*  K 4.3 3.9 3.7  CL 98 95* 91*  CO2 30 30 29   BUN 14 14 16   CREATININE 0.81 0.76 0.76  GLUCOSE 149* 175* 169*    Recent Labs Lab 05/05/12 2145 05/06/12 0600 05/07/12 0502  HGB 10.9* 10.9* 11.0*  HCT 32.2* 32.1* 32.3*  WBC 6.8 5.4 5.5  PLT 204 209 215   Dg Chest 2 View  05/05/2012  *RADIOLOGY REPORT*  Clinical Data: Shortness of breath and fever.  CHEST - 2 VIEW  Comparison: 04/16/2012  Findings: Lungs are adequately inflated without focal  consolidation or effusion.  The cardiomediastinal silhouette and remainder of the exam is unchanged.  IMPRESSION: No acute cardiopulmonary disease.   Original Report Authenticated By: Elberta Fortis, M.D.    Dg Chest Port 1 View  05/06/2012  *RADIOLOGY REPORT*  Clinical Data: Evaluate sarcoidosis.  PORTABLE CHEST - 1 VIEW  Comparison: 05/05/2012 05/01/2010  Findings: Increased lung markings may represent decreased lung volumes.  Heart and mediastinum are grossly stable.  There is no evidence of focal airspace disease.  IMPRESSION: Prominent lung markings may represent decreased lung volumes and chronic changes.  Difficult to exclude subtle nodular disease.  No acute chest findings or airspace disease.   Original Report Authenticated By: Richarda Overlie, M.D.     Recent Labs Lab 05/05/12 2145 05/06/12 0600 05/07/12 0502  NA 136 136 133*  K 4.3 3.9 3.7  CL 98 95* 91*  CO2 30 30 29   BUN 14 14 16   CREATININE 0.81 0.76 0.76  GLUCOSE 149* 175* 169*    Recent Labs Lab 05/05/12 2145 05/06/12 0600 05/07/12 0502  HGB 10.9* 10.9* 11.0*  HCT 32.2* 32.1* 32.3*  WBC 6.8 5.4 5.5  PLT 204 209 215        ASSESSMENT / PLAN:  Pulmonary sarcoidosis, appears stable without evidence of significant parenchymal involvement or bronchospasm Pulmonary hypertension (last TTE 04/17/12 PAP 53 torr) Cor  pulmonale, likely worsening given weight gain and pedal edema Hypoxia Dyspnea, multifactorial, better with diuresis 3-/18 Doubt pneumonia given normal WBC and CXR and pct PE is possible but less likely CKD with unknown recent Cr DM    -->  TTE 04/17/12 LAE >>RAE, mod lvh with nl ef.   >    bnp  132 3/18  > repeat echo 3/18 Left ventricle: The cavity size was normal. Wall thickness was normal. Systolic function was normal. The estimated ejection fraction was in the range of 55% to 60%. Wall motion was normal; there were no regional wall motion abnormalities. - Aortic valve: Trivial regurgitation. -  Mitral valve: Calcified annulus. Mildly thickened leaflets . The findings are consistent with trivial stenosis. Mild to moderate regurgitation directed centrally. Valve area by pressure half-time: 2.06cm^2. - Left atrium: The atrium was moderately dilated.  -->  Venous Doppler BLLE   3/17 >>>neg for dvt 3/19 plan transition to po diuretic/replete K and plan for dc 3-20  Not clear she really understands nature of PAH or need to wear 02 more consistently - also clearly needs med reconciliation.  To keep things simple, I have asked the patient to first separate medicines that are perceived as maintenance, that is to be taken daily "no matter what", from those medicines that are taken on only on an as-needed basis and I have given the patient examples of both, and then return to see our NP to generate a  detailed  medication calendar which should be followed until the next physician sees the patient and updates it. Need to see this up for 3/25 with Tammy Parrett NP  Sandrea Hughs, MD Pulmonary and Critical Care Medicine [facility] Cell [phone] After 5:30 PM or weekends, call [phone]

## 2012-05-07 NOTE — Progress Notes (Signed)
Inpatient Diabetes Program Recommendations  AACE/ADA: New Consensus Statement on Inpatient Glycemic Control (2013)  Target Ranges:  Prepandial:   less than 140 mg/dL      Peak postprandial:   less than 180 mg/dL (1-2 hours)      Critically ill patients:  140 - 180 mg/dL  Fasting glucose this am at 290 mg/dL Pt takes levemir 30 units daily at home. Please add 20 units to start while here of Levemir    Thank you, Lenor Coffin, RN, CNS, Diabetes Coordinator [phone])

## 2012-05-08 LAB — GLUCOSE, CAPILLARY: Glucose-Capillary: 124 mg/dL — ABNORMAL HIGH (ref 70–99)

## 2012-05-08 LAB — PHOSPHORUS: Phosphorus: 3.4 mg/dL (ref 2.3–4.6)

## 2012-05-08 NOTE — Care Management Note (Signed)
    Page 1 of 1   05/08/2012     11:59:47 AM   CARE MANAGEMENT NOTE 05/08/2012  Patient:  Penny Curry,Penny Curry   Account Number:  [id number]  Date Initiated:  05/08/2012  Documentation initiated by:  GRAVES-BIGELOW,Aune Adami  Subjective/Objective Assessment:   Pt admitted with dyspnea. Plan for d/c home today.     Action/Plan:   CM did speak to pt in reference to [facility] services and pt feels she does not need HHRN at this time. She wants to talk to her PCP and CM stated they can also make referral for services. [facility] information provided to pt via [employer] Anibal Henderson.   Anticipated DC Date:  05/08/2012   Anticipated DC Plan:  HOME/SELF CARE      DC Planning Services  CM consult      Choice offered to / List presented to:             Status of service:  Completed, signed off Medicare Important Message given?   (If response is "NO", the following Medicare IM given date fields will be blank) Date Medicare IM given:   Date Additional Medicare IM given:    Discharge Disposition:  HOME/SELF CARE  Per UR Regulation:  Reviewed for med. necessity/level of care/duration of stay  If discussed at Long Length of Stay Meetings, dates discussed:    Comments:

## 2012-05-08 NOTE — Discharge Summary (Signed)
Physician Discharge Summary  Patient ID: Penny Curry MRN: [id number] DOB/AGE: [birth year] 72 y.o.  Admit date: 05/05/2012 Discharge date: 05/08/2012  Problem List Active Problems:   PULMONARY HYPERTENSION   PULMONARY SARCOIDOSIS   Diabetes mellitus type 2, controlled   Hypoxia   CKD (chronic kidney disease) stage 2, GFR 60-89 ml/min  HPI: 72 yo with pulmonary sarcoidosis and pulmonary hypertension ( followed by Dr. Delton Coombes ) with progressive dyspnea and weight gain sent from Urgent Care for further evaluation. At baseline reports minimal dyspnea at rest with some increase on exertion, no orthopnea / paroxysmal nocturnal dyspnea, intermittent cough nocturnal only oxygen use. Since about 3 weeks ago reports increased dyspnea and frequency of cough, but no clearcut purulent sputum. Initially had fever and chills and was treated as outpatient with Cefdinir for bronchitis with minimal improvement. Briefly admitted early this month for sam (no infiltrates on CXR) and treated with Levaquin. Was doing well after discharge, but then developed dyspnea again and was started on Moxifloxacin by PCP without improvement. Associated symptoms were paroxysmal nocturnal dyspnea, orthopnea and pedal edema (symmetrical). She reports gaining about 9 lb since one week ago. All those symptoms were new. This time she did not have fever, though reports feeling hot. She also started using oxygen around the clock due to dyspnea. There was no chest pain or palpitations. She presented to Urgent Care where her WBC was found to be normal and CXR revealed no focal infiltrates. She was sent to [facility] for direct admission and further evaluation.  Hospital Course:  ASSESSMENT / PLAN:  Pulmonary sarcoidosis, appears stable without evidence of significant parenchymal involvement or bronchospasm  Pulmonary hypertension   Cor pulmonale, likely worsening given weight gain and pedal edema  Hypoxia  Dyspnea, multifactorial, better with  diuresis 3/18  Doubt pneumonia given normal WBC and CXR and pct  PE is possible but less likely  CKD with unknown recent Cr  DM  --> TTE 04/17/12 LAE >>RAE, mod lvh with nl ef.  > bnp 132 3/18  > repeat echo 3/18 Left ventricle: The cavity size was normal. Wall thickness was normal. Systolic function was normal. The estimated ejection fraction was in the range of 55% to 60%. Wall motion was normal; there were no regional wall motion abnormalities. - Aortic valve: Trivial regurgitation. - Mitral valve: Calcified annulus. Mildly thickened leaflets . The findings are consistent with trivial stenosis. Mild to moderate regurgitation directed centrally. Valve area by pressure half-time: 2.06cm^2. - Left atrium: The atrium was moderately dilated.  --> Venous Doppler BLLE 3/17 >>>neg for dvt  3/19 plan transition to po diuretic/replete K and plan for dc 3-20  Not clear she really understands nature of PAH or need to wear 02 more consistently - also clearly needs med reconciliation. To keep things simple, I have asked the patient to first separate medicines that are perceived as maintenance, that is to be taken daily "no matter what", from those medicines that are taken on only on an as-needed basis and I have given the patient examples of both, and then return to see our NP to generate a detailed medication calendar which should be followed until the next physician sees the patient and updates it.  Note her dry weight as listed below.  Filed Weights   05/06/12 0602 05/08/12 0500  Weight: 88.089 kg (194 lb 3.2 oz) 88.724 kg (195 lb 9.6 oz)    Intake/Output Summary (Last 24 hours) at 05/08/12 0854 Last data filed at 05/08/12 0500  Gross  per 24 hour  Intake    240 ml  Output   1000 ml  Net   -760 ml     Labs at discharge Lab Results  Component Value Date   CREATININE 0.76 05/07/2012   BUN 16 05/07/2012   NA 133* 05/07/2012   K 3.7 05/07/2012   CL 91* 05/07/2012   CO2 29 05/07/2012   Lab  Results  Component Value Date   WBC 5.5 05/07/2012   HGB 11.0* 05/07/2012   HCT 32.3* 05/07/2012   MCV 89.0 05/07/2012   PLT 215 05/07/2012   Lab Results  Component Value Date   ALT 15 05/05/2012   AST 15 05/05/2012   ALKPHOS 76 05/05/2012   BILITOT 0.2* 05/05/2012   No results found for this basename: INR, PROTIME    Current radiology studies No results found.  Disposition:  01-Home or Self Care  Discharge Orders   Future Appointments Provider Department Dept Phone   05/15/2012 1:00 PM [facility] [phone]   05/15/2012 4:15 PM Julio Sicks, NP [facility] [phone]   Future Orders Complete By Expires     Discharge patient  As directed         Medication List    STOP taking these medications       levofloxacin 750 MG tablet  Commonly known as:  LEVAQUIN     moxifloxacin 400 MG tablet  Commonly known as:  AVELOX      TAKE these medications       albuterol 108 (90 BASE) MCG/ACT inhaler  Commonly known as:  PROVENTIL HFA;VENTOLIN HFA  Inhale 2 puffs into the lungs every 6 (six) hours as needed for wheezing or shortness of breath.     budesonide-formoterol 160-4.5 MCG/ACT inhaler  Commonly known as:  SYMBICORT  Inhale 2 puffs into the lungs 2 (two) times daily.     citalopram 40 MG tablet  Commonly known as:  CELEXA  Take 0.5 tablets (20 mg total) by mouth every evening.     divalproex 500 MG DR tablet  Commonly known as:  DEPAKOTE  Take 1,000 mg by mouth every evening.     fluticasone 50 MCG/ACT nasal spray  Commonly known as:  FLONASE  Place 2 sprays into the nose daily.     furosemide 40 MG tablet  Commonly known as:  LASIX  Take 1 tablet (40 mg total) by mouth daily before breakfast.     LEVEMIR FLEXPEN 100 UNIT/ML injection  Generic drug:  insulin detemir  Inject 30 Units into the skin daily with breakfast.     NOVOLOG FLEXPEN 100 UNIT/ML injection  Generic drug:  insulin aspart   Inject 5 Units into the skin 3 (three) times daily before meals.     omeprazole 20 MG capsule  Commonly known as:  PRILOSEC  Take 20 mg by mouth 2 (two) times daily.     potassium chloride 10 MEQ CR tablet  Commonly known as:  KLOR-CON  Take 20 mEq by mouth daily before breakfast.     rOPINIRole 3 MG tablet  Commonly known as:  REQUIP  Take 3 mg by mouth 2 (two) times daily.     traMADol 50 MG tablet  Commonly known as:  ULTRAM  Take 50 mg by mouth 3 (three) times daily.           Follow-up Information   Follow up with PARRETT,TAMMY, NP On 05/15/2012. (4:15 pm with Tammy Parrett ANP)  Contact information:   [address] [place] [address] [phone]       Discharged Condition: good Vital signs at Discharge. Temp:  [97.6 F (36.4 C)-98.2 F (36.8 C)] 97.6 F (36.4 C) (03/20 0500) Pulse Rate:  [88-91] 88 (03/20 0500) Resp:  [18-20] 20 (03/20 0500) BP: (112-121)/(57-64) 119/62 mmHg (03/20 0500) SpO2:  [93 %-100 %] 94 % (03/20 0730) Weight:  [88.724 kg (195 lb 9.6 oz)] 88.724 kg (195 lb 9.6 oz) (03/20 0500) Office follow up Special Information or instructions. See Tammy Parrett in the office. Per Dr. Sherene Sires she needs a medication calendar.  Follow up with Dr. Delton Coombes in near future. Diuresis was key to her improvement. Signed: Brett Canales Minor ACNP [facility] Pager [phone] till 3 pm If no answer page [phone] 05/08/2012, 8:47 AM  Pt seen and examined and agree with discharge plan as outlined.   Sandrea Hughs, MD Pulmonary and Critical Care Medicine [facility] Cell [phone] After 5:30 PM or weekends, call [phone]

## 2012-05-08 NOTE — Progress Notes (Signed)
Patient evaluated for community based chronic disease management services with [facility] Care Management Program as a benefit of patient's [facility] Medicare Insurance.  Spoke with patient at bedside to explain [facility] Care Management services. Patient would like to discuss services with her daughter prior to engagement.  Left contact information and [facility] literature at bedside.  Patient indicated that she is having occasional difficulty getting same day or sick day appointments with her PCP, Dr Juleen China.  She is receiving medications through what she describes as a Research officer, trade union.  Indicated that she was admitted 3 x in 2013 for PNA.  She is not receiving [employer] telehealth support at this time.  Made inpatient Mable Fill RN Case Manager aware that [facility] Care Management consult.  Of note, [facility] Care Management services does not replace or interfere with any services that are arranged by inpatient case management or social work.  For additional questions or referrals please contact Anibal Henderson BSN RN [facility] Liaison at [phone].

## 2012-05-12 ENCOUNTER — Encounter (HOSPITAL_COMMUNITY): Payer: Self-pay | Admitting: *Deleted

## 2012-05-12 NOTE — Progress Notes (Signed)
Spoke with Dr. Christella Hartigan nurse Patty called patient for pre Endo phone call she stated she plans to cancel her procedure because she has been in the hospital three times in three weeks .  She is too tired to do the procedure at this time. Stated she tried to call the office today and tell leave a message no one has called her back yet.

## 2012-05-13 ENCOUNTER — Telehealth: Payer: Self-pay | Admitting: Gastroenterology

## 2012-05-13 NOTE — Telephone Encounter (Signed)
Pt called to cancel her endo colon she was just released form the hospital.  She wants to call back to schedule  ENDO has been notified

## 2012-05-13 NOTE — Telephone Encounter (Signed)
Left message on machine to call back  

## 2012-05-13 NOTE — Telephone Encounter (Signed)
ok 

## 2012-05-15 ENCOUNTER — Ambulatory Visit (INDEPENDENT_AMBULATORY_CARE_PROVIDER_SITE_OTHER): Payer: Medicare Other | Admitting: Adult Health

## 2012-05-15 ENCOUNTER — Other Ambulatory Visit (INDEPENDENT_AMBULATORY_CARE_PROVIDER_SITE_OTHER): Payer: Medicare Other

## 2012-05-15 ENCOUNTER — Encounter: Payer: Self-pay | Admitting: Adult Health

## 2012-05-15 VITALS — BP 136/60 | HR 107 | Temp 98.1°F | Ht 65.0 in | Wt 197.4 lb

## 2012-05-15 DIAGNOSIS — I2781 Cor pulmonale (chronic): Secondary | ICD-10-CM

## 2012-05-15 DIAGNOSIS — R300 Dysuria: Secondary | ICD-10-CM

## 2012-05-15 DIAGNOSIS — I279 Pulmonary heart disease, unspecified: Secondary | ICD-10-CM

## 2012-05-15 LAB — URINALYSIS, ROUTINE W REFLEX MICROSCOPIC
Bilirubin Urine: NEGATIVE
Ketones, ur: NEGATIVE
Nitrite: NEGATIVE
Specific Gravity, Urine: 1.015 (ref 1.000–1.030)
Total Protein, Urine: NEGATIVE
pH: 5.5 (ref 5.0–8.0)

## 2012-05-15 NOTE — Patient Instructions (Addendum)
Continue on Symbicort 2 puffs Twice daily   Continue on Oxygen 3 l/m At bedtime  And As needed  With activity.  Continue on Lasix 40mg  daily  Low salt diet  Keep leg elevated.  follow up Dr. Delton Coombes  In 6 -8 weeks and As needed   I will call with urine results.

## 2012-05-15 NOTE — Progress Notes (Signed)
Subjective:    Patient ID: Penny Curry, female    DOB: 1940/06/12    MRN: [id number]  HPI 72 yo woman, former tobacco, recently dx w DM. Referred by Dr Leonor Liv for dyspnea and hx ILD followed by Dr DeCoy in [place], initially received dx of IPF. Underwent FOB and Bx in 2005, treated with pred and cytoxan in the past. Then had nodules on arm that were bx and showed probable sarcoidosis Summer 2011 ([place]). Last CT scan was prob 11/2009 (she isn't sure). 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   She has had an ONO that showed she needs O2 at night. She wears 3L/min at bedtime. She had PSG last summer [facility] - said she does not have OSA but needs O2.   ROV 05/01/10 -- f/u sarcoidosis, assoc ILD and PAH. TTE since last visit PASp ~ 36 mmHg. Saw Dr Leonor Liv about 10 days ago, has had about 5 weeks cough, congestion, low grade fever. Treated 2 rounds abx, most recently avelox by Dr Leonor Liv. No prednisone. Feels like she is improving, but still limited by SOB. No PFT on record, not currently on BDs.   ROV 06/23/10 -- sarcoidosis, assoc ILD, PAH. 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 Pseudo-normalization), decreased diffusion that corrects for Va. Has been feeling fairly well, was seen for abd pain, treated w cipro for ? Diverticulitis or UTI. Breathing has been fair, she believes that the heat and pollen make her worse. Not currently on allergy regimen. Has had some typical sarcoid rash on arms, has resolved (march 12).   ROV 01/05/11 -- 72 yo woman, sarcoidosis, assoc ILD, PAH. PFT 06/2010 = Moderate mixed disease (FEV1 1.63L, 75%). Regular f/u visit. Tells me that she is having more exertional SOB over last 2 months. Very little cough. She does hear some wheeze occasionally w exertion.  She has been having scratchy throat, some difficulty swallowing with choking - has been happening for a long time, but worse last 2 months.  She is on loratadine and omeprazole.    ROV 01/25/11 -- 73 yo woman, sarcoidosis, assoc ILD, PAH. PFT 06/2010 = Moderate mixed disease (FEV1 1.63L, 75%). She returns after having her swallowing eval and her CT chest. Need to get the CT scans from [place] [facility] to compare. She returns with more cough, green mucous, nasal gtt and congestion.   03/22/2011 Acute OV  Returns for persistent cough and congestion . Been seen at PCP and urgent care x 4  over last month at Urgent care for slow to resolve PNA . Currently on Avelox day 8/10. Finished prednisone 3 weeks ago without much help. She has been on 4 different abx including levaquin and avelox .  Still has cough and congestion with thick green mucus. Has some sinus pain and pressure with drainage. No vomitting or edema. No chest pain .  Low grade fevers on/off.  Cough is wearing her out. Take tussionex with some help but does not last. Too expensive.  >>prednisone burst   04/16/2011 Acute OV  Complains of persistent cough and congestion. Worse cough for last 1 week with green mucus, wheezing, increased SOB, tightness in chest, hoarseness, low grade temp x 1 week. Seen 1 month ago for similar symptoms. CXR report from urgent care with no acute process, tx w/ steroid burst, sample of symbicort and Avelox x 7 d . Had minimal improvement and now cough is getting worse. Has  nasal congestion and drainage. Was not able to get hydromet cough syrup due to cough. Cough is aggravating and keeping her up at night. No hemopytsis or chest pain . No n/v/d. No new meds. Currently on prednisone 20mg  daily  >>Levaquin x 10 d , CT sinus +   04/30/2011 Acute OV  Complains of persistent symptoms of cough and congestion . Complains of cough with yellow, sob, wheezing, chest pain, and fever up to 101. States was in the ED last night for  Bronchitis. Has been on several abx for last 2 months . Seen  2 weeks ago with Sarcoid flare /bronchitis and sinusitis . CT sinus showed Paranasal sinus mucosal thickening/opacification  most notable involving the maxillary sinuses (greater on the left) and right ethmoid sinus air cells.she was tx with Levaquin x 10 days and prednisone held at 20mg  daily . However she tapered off steroids -misunderstood directions.  She feels her cough never got better Was seen in ER at [facility] last night, told she had bronchitis and to. follow up in our office. She Was not given any rx. Told xray was neg for PNA. Records are not available.  She does have fever on/off. Tmax 101. Recent labs with no elevated wbc per pt/daughter.  Did have desats in ER to 87%. Wears O2 at sleep only.   ROV 05/04/11 -- 72 yo woman, sarcoidosis, assoc ILD, PAH. PFT 06/2010 = Moderate mixed disease (FEV1 1.63L, 75%). On omeprazole/lansoprazole, loratadine. Has been seen monthly as above for continued nasal congestion and cough - multiple rounds abx and pred with no lasting effect, tapered down to 20mg , now back up to 40mg . She is on Symbicort - restarted ??. Her sinus CT scan shows chronic sinusitis. She has been having low grade fevers. Blows yellow green out of her nose.  rec Clindamycin x 6 weeks, pred taper off  05/23/2011 f/u ov/Wert cc intermittent fever to 101 and mucus dark turned lighter yellow while on clindamycin but stopped p 2 weeks due to rash, no better on prednisone, no worse off it, no worse off symbicort p ran out if it a week prior to OV .  Mod nasal congestion. No using ppi timed to ac. No sinus or tooth pain. Sob not changed from baseline >>GERD tx   05/15/12 post hospital follow up     HFU - reports doing well overall but still weak and has decreased energy.  Patient was recently admitted with pulmonary hypertension, and pulmonary sarcoidosis. She was felt to have decompensated cor pulmonale likely secondary to pulmonary hypertension. She improved with diuresis. She was Recommended on O2 compliance. 2-D echo showed normal ejection fracture 55-60% mild to moderate mitral valve regurgitation. Left atrium  was moderately dilated. Venous Dopplers were negative. Patient denies any hemoptysis, orthopnea, PND, or increased leg swelling.   Does complain of urinary urgency at times. Wants urine checked   Ros Reviewed  Neg except HPI               Objective:   Physical Exam Gen: anxious amb very hoarse wf nad    ENT: No lesions,  mouth clear,  oropharynx clear, no postnasal drip  Neck: No JVD, no TMG, no carotid bruits  Lungs: No use of accessory muscles, no dullness to percussion, Coarse BS w/ no wheezing  Harsh barking cough  Cardiovascular: RRR, heart sounds normal, no murmur or gallops, tr  peripheral edema  Musculoskeletal: No deformities, no cyanosis or clubbing  Neuro: alert, non focal  Skin:  Warm, no lesions or rashes      Assessment & Plan:

## 2012-05-18 LAB — URINE CULTURE

## 2012-05-19 NOTE — Assessment & Plan Note (Signed)
Recent decompensation, secondary to underlying pulmonary hypertension, and pulmonary sarcoidosis. Patient is advised on medication compliance, along with O2 compliance.   Plan  Continue on Symbicort 2 puffs Twice daily   Continue on Oxygen 3 l/m At bedtime  And As needed  With activity.  Continue on Lasix 40mg  daily  Low salt diet  Keep leg elevated.  follow up Dr. Delton Coombes  In 6 -8 weeks and As needed

## 2012-05-21 NOTE — Progress Notes (Signed)
Quick Note:  LMOM TCB x1. ______ 

## 2012-05-22 ENCOUNTER — Encounter (HOSPITAL_COMMUNITY): Admission: RE | Payer: Self-pay | Source: Ambulatory Visit

## 2012-05-22 ENCOUNTER — Ambulatory Visit (HOSPITAL_COMMUNITY): Admission: RE | Admit: 2012-05-22 | Payer: Medicare Other | Source: Ambulatory Visit | Admitting: Gastroenterology

## 2012-05-22 SURGERY — ESOPHAGOGASTRODUODENOSCOPY (EGD) WITH PROPOFOL
Anesthesia: Monitor Anesthesia Care

## 2012-05-23 NOTE — Progress Notes (Signed)
Quick Note:  LMOM TCB x1 on 05-21-12. ______ 

## 2012-05-26 ENCOUNTER — Other Ambulatory Visit: Payer: Self-pay | Admitting: Emergency Medicine

## 2012-06-09 ENCOUNTER — Encounter: Payer: Self-pay | Admitting: *Deleted

## 2012-06-09 NOTE — Progress Notes (Signed)
Quick Note:  Called spoke with patient, advised of lab results / recs as stated by TP. Pt verbalized her understanding and denied any questions. ______ 

## 2012-07-09 ENCOUNTER — Ambulatory Visit (INDEPENDENT_AMBULATORY_CARE_PROVIDER_SITE_OTHER): Payer: Medicare Other | Admitting: Emergency Medicine

## 2012-07-09 ENCOUNTER — Encounter: Payer: Self-pay | Admitting: Emergency Medicine

## 2012-07-09 VITALS — BP 118/70 | HR 82 | Temp 97.8°F | Ht 65.0 in | Wt 195.0 lb

## 2012-07-09 DIAGNOSIS — R05 Cough: Secondary | ICD-10-CM

## 2012-07-09 DIAGNOSIS — D869 Sarcoidosis, unspecified: Secondary | ICD-10-CM

## 2012-07-09 NOTE — Assessment & Plan Note (Signed)
Stable on omeprazole bid, nasal steroid.  - if she flares then would add loratadine, increase nasal steroid to bid, consider changing omeprazole to an alternative PPI

## 2012-07-09 NOTE — Patient Instructions (Addendum)
Please continue your current medications If your nasal congestion or cough flare, then call our office. We may be able to adjust your medications an help  We will perform full pulmonary function testing  Follow with Dr Delton Coombes in 3 months or sooner if you have any problems.

## 2012-07-09 NOTE — Progress Notes (Signed)
Subjective:    Patient ID: [facility], female    DOB: September 22, 1940    MRN: [id number]  HPI 72 yo woman, former tobacco, recently dx w DM. Referred by Dr Penny Curry for dyspnea and hx ILD followed by Dr Penny Curry in [place], initially received dx of IPF. Underwent FOB and Bx in 2005, treated with pred and cytoxan in the past. Then had nodules on arm that were bx and showed probable sarcoidosis Summer 2011 ([place]). Last CT scan was prob 11/2009 (she isn't sure). 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   She has had an ONO that showed she needs O2 at night. She wears 3L/min at bedtime. She had PSG last summer [facility] - said she does not have OSA but needs O2.   ROV 05/01/10 -- f/u sarcoidosis, assoc ILD and PAH. TTE since last visit PASp ~ 36 mmHg. Saw Dr Penny Curry about 10 days ago, has had about 5 weeks cough, congestion, low grade fever. Treated 2 rounds abx, most recently avelox by Dr Penny Curry. No prednisone. Feels like she is improving, but still limited by SOB. No PFT on record, not currently on BDs.   ROV 06/23/10 -- sarcoidosis, assoc ILD, PAH. 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 Pseudo-normalization), decreased diffusion that corrects for Va. Has been feeling fairly well, was seen for abd pain, treated w cipro for ? Diverticulitis or UTI. Breathing has been fair, she believes that the heat and pollen make her worse. Not currently on allergy regimen. Has had some typical sarcoid rash on arms, has resolved (march 12).   ROV 01/05/11 -- 72 yo woman, sarcoidosis, assoc ILD, PAH. PFT 06/2010 = Moderate mixed disease (FEV1 1.63L, 75%). Regular f/u visit. Tells me that she is having more exertional SOB over last 2 months. Very little cough. She does hear some wheeze occasionally w exertion.  She has been having scratchy throat, some difficulty swallowing with choking - has been happening for a long time, but worse last 2 months.  She is on loratadine and omeprazole.    ROV 01/25/11 -- 72 yo woman, sarcoidosis, assoc ILD, PAH. PFT 06/2010 = Moderate mixed disease (FEV1 1.63L, 75%). She returns after having her swallowing eval and her CT chest. Need to get the CT scans from [place] [facility] to compare. She returns with more cough, green mucous, nasal gtt and congestion.   03/22/2011 Acute OV  Returns for persistent cough and congestion . Been seen at PCP and urgent care x 4  over last month at Urgent care for slow to resolve PNA . Currently on Avelox day 8/10. Finished prednisone 3 weeks ago without much help. She has been on 4 different abx including levaquin and avelox .  Still has cough and congestion with thick green mucus. Has some sinus pain and pressure with drainage. No vomitting or edema. No chest pain .  Low grade fevers on/off.  Cough is wearing her out. Take tussionex with some help but does not last. Too expensive.  >>prednisone burst   04/16/2011 Acute OV  Complains of persistent cough and congestion. Worse cough for last 1 week with green mucus, wheezing, increased SOB, tightness in chest, hoarseness, low grade temp x 1 week. Seen 1 month ago for similar symptoms. CXR report from urgent care with no acute process, tx w/ steroid burst, sample of symbicort and Avelox x 7 d . Had minimal improvement and now cough is getting worse. Has  nasal congestion and drainage. Was not able to get hydromet cough syrup due to cough. Cough is aggravating and keeping her up at night. No hemopytsis or chest pain . No n/v/d. No new meds. Currently on prednisone 20mg  daily  >>Levaquin x 10 d , CT sinus +   04/30/2011 Acute OV  Complains of persistent symptoms of cough and congestion . Complains of cough with yellow, sob, wheezing, chest pain, and fever up to 101. States was in the ED last night for  Bronchitis. Has been on several abx for last 2 months . Seen  2 weeks ago with Sarcoid flare /bronchitis and sinusitis . CT sinus showed Paranasal sinus mucosal thickening/opacification  most notable involving the maxillary sinuses (greater on the left) and right ethmoid sinus air cells.she was tx with Levaquin x 10 days and prednisone held at 20mg  daily . However she tapered off steroids -misunderstood directions.  She feels her cough never got better Was seen in ER at [facility] last night, told she had bronchitis and to. follow up in our office. She Was not given any rx. Told xray was neg for PNA. Records are not available.  She does have fever on/off. Tmax 101. Recent labs with no elevated wbc per pt/daughter.  Did have desats in ER to 87%. Wears O2 at sleep only.   ROV 05/04/11 -- 72 yo woman, sarcoidosis, assoc ILD, PAH. PFT 06/2010 = Moderate mixed disease (FEV1 1.63L, 75%). On omeprazole/lansoprazole, loratadine. Has been seen monthly as above for continued nasal congestion and cough - multiple rounds abx and pred with no lasting effect, tapered down to 20mg , now back up to 40mg . She is on Symbicort - restarted ??. Her sinus CT scan shows chronic sinusitis. She has been having low grade fevers. Blows yellow green out of her nose.  rec Clindamycin x 6 weeks, pred taper off  05/23/2011 f/u ov/Wert cc intermittent fever to 101 and mucus dark turned lighter yellow while on clindamycin but stopped p 2 weeks due to rash, no better on prednisone, no worse off it, no worse off symbicort p ran out if it a week prior to OV .  Mod nasal congestion. No using ppi timed to ac. No sinus or tooth pain. Sob not changed from baseline >>GERD tx   05/15/12 post hospital follow up     HFU - reports doing well overall but still weak and has decreased energy.  Patient was recently admitted with pulmonary hypertension, and pulmonary sarcoidosis. She was felt to have decompensated cor pulmonale likely secondary to pulmonary hypertension. She improved with diuresis. She was Recommended on O2 compliance. 2-D echo showed normal ejection fracture 55-60% mild to moderate mitral valve regurgitation. Left atrium  was moderately dilated. Venous Dopplers were negative. Patient denies any hemoptysis, orthopnea, PND, or increased leg swelling.   Does complain of urinary urgency at times. Wants urine checked   ROV 07/09/12 -- 72 yo woman, sarcoidosis, assoc ILD, PAH. PFT 06/2010 = Moderate mixed disease (FEV1 1.63L, 75%). Many of her visits have been acute OV's for cough, congestion, bronchospasm. TTE 3/14 > intact LV fxn, normal RV size and fxn.  She has had some cough, she is on fluticasone. Omeprazole bid > still some breakthrough GERD sx.       Objective:   Physical Exam Filed Vitals:   07/09/12 1101  BP: 118/70  Pulse: 82  Temp: 97.8 F (36.6 C)  TempSrc: Oral  Height: 5\' 5"  (1.651 m)  Weight: 195 lb (88.451 kg)  SpO2: 98%   Gen: anxious amb very hoarse wf nad   ENT: No lesions,  mouth clear,  oropharynx clear, no postnasal drip  Neck: No JVD, no TMG, no carotid bruits  Lungs: No use of accessory muscles, no dullness to percussion, Coarse BS w/ no wheezing  Harsh barking cough  Cardiovascular: RRR, heart sounds normal, no murmur or gallops, tr  peripheral edema  Musculoskeletal: No deformities, no cyanosis or clubbing  Neuro: alert, non focal  Skin: Warm, no lesions or rashes   CXR 05/05/12 --  Comparison: 05/05/2012 05/01/2010  Findings: Increased lung markings may represent decreased lung  volumes. Heart and mediastinum are grossly stable. There is no  evidence of focal airspace disease.  IMPRESSION: Prominent lung markings may represent decreased lung  volumes and chronic changes. Difficult to exclude subtle nodular  disease.  No acute chest findings or airspace disease     Assessment & Plan:  PULMONARY SARCOIDOSIS Due for repeat PFT, will perform before next OV Last CXR stable in 3/14 Continue symbicort   Chronic cough Stable on omeprazole bid, nasal steroid.  - if she flares then would add loratadine, increase nasal steroid to bid, consider changing omeprazole to  an alternative PPI

## 2012-07-09 NOTE — Assessment & Plan Note (Signed)
Due for repeat PFT, will perform before next OV Last CXR stable in 3/14 Continue symbicort

## 2012-08-05 ENCOUNTER — Other Ambulatory Visit: Payer: Self-pay | Admitting: Internal Medicine

## 2012-08-12 NOTE — Telephone Encounter (Signed)
Patient not seen at this office

## 2012-09-09 ENCOUNTER — Other Ambulatory Visit: Payer: Self-pay | Admitting: Emergency Medicine

## 2012-09-09 MED ORDER — OMEPRAZOLE 20 MG PO CPDR: 20.0000 mg | DELAYED_RELEASE_CAPSULE | Freq: Two times a day (BID) | ORAL | Status: DC

## 2012-09-09 NOTE — Telephone Encounter (Signed)
Refill request resent because first was printed out.

## 2012-10-09 ENCOUNTER — Ambulatory Visit: Payer: Medicare Other | Admitting: Emergency Medicine

## 2012-10-27 ENCOUNTER — Emergency Department (HOSPITAL_COMMUNITY): Payer: Medicare Other

## 2012-10-27 ENCOUNTER — Observation Stay (HOSPITAL_COMMUNITY)
Admission: EM | Admit: 2012-10-27 | Discharge: 2012-10-28 | Disposition: A | Discharge status: Home / Self Care | Payer: Medicare Other | Attending: Internal Medicine | Admitting: Internal Medicine

## 2012-10-27 ENCOUNTER — Ambulatory Visit (INDEPENDENT_AMBULATORY_CARE_PROVIDER_SITE_OTHER): Payer: Medicare Other | Admitting: Emergency Medicine

## 2012-10-27 VITALS — BP 118/74 | HR 107 | Temp 98.9°F | Resp 18 | Ht 65.5 in | Wt 195.0 lb

## 2012-10-27 DIAGNOSIS — J329 Chronic sinusitis, unspecified: Secondary | ICD-10-CM

## 2012-10-27 DIAGNOSIS — R0609 Other forms of dyspnea: Secondary | ICD-10-CM

## 2012-10-27 DIAGNOSIS — I2781 Cor pulmonale (chronic): Secondary | ICD-10-CM

## 2012-10-27 DIAGNOSIS — R7989 Other specified abnormal findings of blood chemistry: Secondary | ICD-10-CM

## 2012-10-27 DIAGNOSIS — J209 Acute bronchitis, unspecified: Secondary | ICD-10-CM | POA: Diagnosis present

## 2012-10-27 DIAGNOSIS — E119 Type 2 diabetes mellitus without complications: Secondary | ICD-10-CM | POA: Diagnosis present

## 2012-10-27 DIAGNOSIS — R05 Cough: Secondary | ICD-10-CM

## 2012-10-27 DIAGNOSIS — I509 Heart failure, unspecified: Secondary | ICD-10-CM | POA: Insufficient documentation

## 2012-10-27 DIAGNOSIS — J302 Other seasonal allergic rhinitis: Secondary | ICD-10-CM

## 2012-10-27 DIAGNOSIS — I2789 Other specified pulmonary heart diseases: Secondary | ICD-10-CM

## 2012-10-27 DIAGNOSIS — G9341 Metabolic encephalopathy: Secondary | ICD-10-CM

## 2012-10-27 DIAGNOSIS — R0789 Other chest pain: Principal | ICD-10-CM | POA: Insufficient documentation

## 2012-10-27 DIAGNOSIS — J9620 Acute and chronic respiratory failure, unspecified whether with hypoxia or hypercapnia: Secondary | ICD-10-CM

## 2012-10-27 DIAGNOSIS — J40 Bronchitis, not specified as acute or chronic: Secondary | ICD-10-CM

## 2012-10-27 DIAGNOSIS — R0600 Dyspnea, unspecified: Secondary | ICD-10-CM

## 2012-10-27 DIAGNOSIS — R1310 Dysphagia, unspecified: Secondary | ICD-10-CM

## 2012-10-27 DIAGNOSIS — N182 Chronic kidney disease, stage 2 (mild): Secondary | ICD-10-CM

## 2012-10-27 DIAGNOSIS — D869 Sarcoidosis, unspecified: Secondary | ICD-10-CM

## 2012-10-27 DIAGNOSIS — J8410 Pulmonary fibrosis, unspecified: Secondary | ICD-10-CM

## 2012-10-27 DIAGNOSIS — I10 Essential (primary) hypertension: Secondary | ICD-10-CM

## 2012-10-27 DIAGNOSIS — R0902 Hypoxemia: Secondary | ICD-10-CM

## 2012-10-27 DIAGNOSIS — Z79899 Other long term (current) drug therapy: Secondary | ICD-10-CM | POA: Insufficient documentation

## 2012-10-27 DIAGNOSIS — R079 Chest pain, unspecified: Secondary | ICD-10-CM | POA: Diagnosis present

## 2012-10-27 DIAGNOSIS — R51 Headache: Secondary | ICD-10-CM

## 2012-10-27 DIAGNOSIS — D860 Sarcoidosis of lung: Secondary | ICD-10-CM

## 2012-10-27 DIAGNOSIS — K649 Unspecified hemorrhoids: Secondary | ICD-10-CM

## 2012-10-27 DIAGNOSIS — R509 Fever, unspecified: Secondary | ICD-10-CM

## 2012-10-27 DIAGNOSIS — J99 Respiratory disorders in diseases classified elsewhere: Secondary | ICD-10-CM | POA: Insufficient documentation

## 2012-10-27 DIAGNOSIS — K625 Hemorrhage of anus and rectum: Secondary | ICD-10-CM

## 2012-10-27 HISTORY — DX: Urinary tract infection, site not specified: N39.0

## 2012-10-27 HISTORY — DX: Unspecified osteoarthritis, unspecified site: M19.90

## 2012-10-27 LAB — GLUCOSE, CAPILLARY: Glucose-Capillary: 85 mg/dL (ref 70–99)

## 2012-10-27 LAB — CBC WITH DIFFERENTIAL/PLATELET
Basophils Relative: 0 % (ref 0–1)
Eosinophils Absolute: 0.1 10*3/uL (ref 0.0–0.7)
Eosinophils Relative: 1 % (ref 0–5)
Hemoglobin: 11.1 g/dL — ABNORMAL LOW (ref 12.0–15.0)
Lymphs Abs: 2.2 10*3/uL (ref 0.7–4.0)
MCH: 29.8 pg (ref 26.0–34.0)
MCHC: 33.3 g/dL (ref 30.0–36.0)
MCV: 89.3 fL (ref 78.0–100.0)
Monocytes Absolute: 0.5 10*3/uL (ref 0.1–1.0)
Monocytes Relative: 7 % (ref 3–12)
Neutrophils Relative %: 60 % (ref 43–77)

## 2012-10-27 LAB — BASIC METABOLIC PANEL
BUN: 18 mg/dL (ref 6–23)
Calcium: 9.4 mg/dL (ref 8.4–10.5)
Creatinine, Ser: 0.73 mg/dL (ref 0.50–1.10)
GFR calc Af Amer: >90 mL/min (ref >90)
GFR calc non Af Amer: 84 mL/min — ABNORMAL LOW (ref >90)
Glucose, Bld: 90 mg/dL (ref 70–99)

## 2012-10-27 LAB — TROPONIN I: Troponin I: <0.3 ng/mL (ref <0.30)

## 2012-10-27 MED ORDER — ALBUTEROL SULFATE (5 MG/ML) 0.5% IN NEBU: 2.5000 mg | INHALATION_SOLUTION | RESPIRATORY_TRACT | Status: DC | PRN

## 2012-10-27 MED ORDER — TRAMADOL HCL 50 MG PO TABS
50.0000 mg | ORAL_TABLET | Freq: Every day | ORAL | Status: DC
  Administered 2012-10-28: 11:00:00 50 mg via ORAL
  Filled 2012-10-27: qty 1

## 2012-10-27 MED ORDER — LEVOFLOXACIN 750 MG PO TABS
750.0000 mg | ORAL_TABLET | Freq: Every day | ORAL | Status: DC
  Administered 2012-10-27 – 2012-10-28 (×2): 750 mg via ORAL
  Filled 2012-10-27 (×2): qty 1

## 2012-10-27 MED ORDER — POTASSIUM CHLORIDE CRYS ER 20 MEQ PO TBCR
20.0000 meq | EXTENDED_RELEASE_TABLET | Freq: Once | ORAL | Status: AC
  Administered 2012-10-27: 20 meq via ORAL
  Filled 2012-10-27: qty 1

## 2012-10-27 MED ORDER — COLESEVELAM HCL 3.75 G PO PACK
1.0000 | PACK | Freq: Every evening | ORAL | Status: DC
  Administered 2012-10-27: 1 via ORAL
  Filled 2012-10-27 (×2): qty 1

## 2012-10-27 MED ORDER — ASPIRIN 81 MG PO CHEW
324.0000 mg | CHEWABLE_TABLET | Freq: Once | ORAL | Status: AC
  Administered 2012-10-27: 324 mg via ORAL
  Filled 2012-10-27: qty 4

## 2012-10-27 MED ORDER — SODIUM CHLORIDE 0.9 % IV SOLN: 250.0000 mL | INTRAVENOUS | Status: DC | PRN

## 2012-10-27 MED ORDER — BUDESONIDE-FORMOTEROL FUMARATE 160-4.5 MCG/ACT IN AERO
2.0000 | INHALATION_SPRAY | Freq: Two times a day (BID) | RESPIRATORY_TRACT | Status: DC
  Filled 2012-10-27 (×2): qty 6

## 2012-10-27 MED ORDER — TRAMADOL HCL 50 MG PO TABS
100.0000 mg | ORAL_TABLET | Freq: Every day | ORAL | Status: DC
  Administered 2012-10-27: 100 mg via ORAL
  Filled 2012-10-27: qty 2

## 2012-10-27 MED ORDER — INSULIN ASPART 100 UNIT/ML ~~LOC~~ SOLN: 0.0000 [IU] | Freq: Three times a day (TID) | SUBCUTANEOUS | Status: DC

## 2012-10-27 MED ORDER — INSULIN ASPART 100 UNIT/ML ~~LOC~~ SOLN
5.0000 [IU] | Freq: Three times a day (TID) | SUBCUTANEOUS | Status: DC
  Administered 2012-10-28 (×2): 5 [IU] via SUBCUTANEOUS

## 2012-10-27 MED ORDER — SODIUM CHLORIDE 0.9 % IJ SOLN
3.0000 mL | Freq: Two times a day (BID) | INTRAMUSCULAR | Status: DC
  Administered 2012-10-27 – 2012-10-28 (×2): 3 mL via INTRAVENOUS

## 2012-10-27 MED ORDER — ACETAMINOPHEN 650 MG RE SUPP: 650.0000 mg | Freq: Four times a day (QID) | RECTAL | Status: DC | PRN

## 2012-10-27 MED ORDER — ENOXAPARIN SODIUM 40 MG/0.4ML ~~LOC~~ SOLN
40.0000 mg | SUBCUTANEOUS | Status: DC
  Administered 2012-10-27: 22:00:00 40 mg via SUBCUTANEOUS
  Filled 2012-10-27 (×2): qty 0.4

## 2012-10-27 MED ORDER — PANTOPRAZOLE SODIUM 40 MG PO TBEC
40.0000 mg | DELAYED_RELEASE_TABLET | Freq: Every day | ORAL | Status: DC
  Administered 2012-10-27 – 2012-10-28 (×2): 40 mg via ORAL
  Filled 2012-10-27 (×2): qty 1

## 2012-10-27 MED ORDER — COLESEVELAM HCL 3.75 G PO PACK: 1.0000 | PACK | Freq: Every evening | ORAL | Status: DC

## 2012-10-27 MED ORDER — TRAMADOL HCL 50 MG PO TABS: 50.0000 mg | ORAL_TABLET | Freq: Two times a day (BID) | ORAL | Status: DC

## 2012-10-27 MED ORDER — ALBUTEROL SULFATE HFA 108 (90 BASE) MCG/ACT IN AERS: 2.0000 | INHALATION_SPRAY | Freq: Four times a day (QID) | RESPIRATORY_TRACT | Status: DC | PRN

## 2012-10-27 MED ORDER — NITROGLYCERIN 0.4 MG SL SUBL
0.4000 mg | SUBLINGUAL_TABLET | SUBLINGUAL | Status: DC | PRN
  Administered 2012-10-27: 0.4 mg via SUBLINGUAL
  Filled 2012-10-27: qty 25

## 2012-10-27 MED ORDER — ONDANSETRON HCL 4 MG PO TABS: 4.0000 mg | ORAL_TABLET | Freq: Four times a day (QID) | ORAL | Status: DC | PRN

## 2012-10-27 MED ORDER — ONDANSETRON HCL 4 MG/2ML IJ SOLN: 4.0000 mg | Freq: Four times a day (QID) | INTRAMUSCULAR | Status: DC | PRN

## 2012-10-27 MED ORDER — CITALOPRAM HYDROBROMIDE 40 MG PO TABS
40.0000 mg | ORAL_TABLET | Freq: Every day | ORAL | Status: DC
  Administered 2012-10-27 – 2012-10-28 (×2): 40 mg via ORAL
  Filled 2012-10-27 (×2): qty 1

## 2012-10-27 MED ORDER — FUROSEMIDE 80 MG PO TABS
80.0000 mg | ORAL_TABLET | Freq: Every day | ORAL | Status: DC
  Administered 2012-10-28: 80 mg via ORAL
  Filled 2012-10-27 (×2): qty 1

## 2012-10-27 MED ORDER — ROPINIROLE HCL 1 MG PO TABS
3.0000 mg | ORAL_TABLET | Freq: Two times a day (BID) | ORAL | Status: DC
  Administered 2012-10-27 – 2012-10-28 (×2): 3 mg via ORAL
  Filled 2012-10-27 (×3): qty 3

## 2012-10-27 MED ORDER — INSULIN DETEMIR 100 UNIT/ML ~~LOC~~ SOLN
30.0000 [IU] | Freq: Every day | SUBCUTANEOUS | Status: DC
  Administered 2012-10-28: 09:00:00 30 [IU] via SUBCUTANEOUS
  Filled 2012-10-27: qty 0.3

## 2012-10-27 MED ORDER — ROPINIROLE HCL 1 MG PO TABS: 3.0000 mg | ORAL_TABLET | Freq: Two times a day (BID) | ORAL | Status: DC

## 2012-10-27 MED ORDER — SODIUM CHLORIDE 0.9 % IJ SOLN: 3.0000 mL | INTRAMUSCULAR | Status: DC | PRN

## 2012-10-27 MED ORDER — INSULIN ASPART 100 UNIT/ML ~~LOC~~ SOLN: 0.0000 [IU] | Freq: Every day | SUBCUTANEOUS | Status: DC

## 2012-10-27 MED ORDER — ROPINIROLE HCL 1 MG PO TABS
3.0000 mg | ORAL_TABLET | Freq: Once | ORAL | Status: AC
  Administered 2012-10-27: 3 mg via ORAL
  Filled 2012-10-27: qty 3

## 2012-10-27 MED ORDER — FLUTICASONE PROPIONATE 50 MCG/ACT NA SUSP
2.0000 | Freq: Every evening | NASAL | Status: DC
  Administered 2012-10-27: 19:00:00 2 via NASAL
  Filled 2012-10-27: qty 16

## 2012-10-27 MED ORDER — SODIUM CHLORIDE 0.9 % IJ SOLN: 3.0000 mL | Freq: Two times a day (BID) | INTRAMUSCULAR | Status: DC

## 2012-10-27 MED ORDER — DIVALPROEX SODIUM 500 MG PO DR TAB
1000.0000 mg | DELAYED_RELEASE_TABLET | Freq: Every evening | ORAL | Status: DC
  Administered 2012-10-27: 19:00:00 1000 mg via ORAL
  Filled 2012-10-27 (×2): qty 2

## 2012-10-27 MED ORDER — ACETAMINOPHEN 325 MG PO TABS
650.0000 mg | ORAL_TABLET | Freq: Four times a day (QID) | ORAL | Status: DC | PRN
  Administered 2012-10-28: 650 mg via ORAL
  Filled 2012-10-27: qty 2

## 2012-10-27 NOTE — ED Notes (Signed)
Patient C/O chest pain that began on Saturday AM.  Patient states that she has lung problems and went [facility].  Patient reports a history of sarcoidosis and susceptibility to pneumonia.  Symptoms get worse when she moves around.

## 2012-10-27 NOTE — H&P (Signed)
[facility] History and Physical  Leshia Kope ZOX:[id number] DOB: 01-Aug-1940 DOA: 10/27/2012  Referring physician: er PCP: Marylen Ponto, MD  Specialists:   Chief Complaint:   HPI: [facility] is a 72 y.o. female  On 3L of O2 at home.  C/o coughing, hurting in her chest, and tightness.  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  Remote smoking hx.  + fevers, + chills.  Seen initially in urgent care and then sent to ER.  Chest pain was reproducible with palpation.  Not requiring extra O2.  In ER, her labs were WNL, EKG similar to previous.   Pain worse with deep breathing.   Hospitalist were asked to obs for CP r/o No dysuria, no nausea, no vomiting   Review of Systems: all systems reviewed, negative unless stated above   Past Medical History  Diagnosis Date  . HTN (hypertension)   . Diabetes mellitus   . Fibrosis of lung   . CHF (congestive heart failure)   . GERD (gastroesophageal reflux disease)   . Chronic headache     migraines   Past Surgical History  Procedure Laterality Date  . Neck surgery  1987  . Partial hysterectomy    . Tubal ligation    . Cataract extraction    . Abdominal hysterectomy     Social History:  reports that she quit smoking about 44 years ago. Her smoking use included Cigarettes. She has a 8 pack-year smoking history. She has never used smokeless tobacco. She reports that she does not drink alcohol or use illicit drugs.   Allergies  Allergen Reactions  . Tape     Skin to break out. Paper tape is ok.  . Clindamycin/Lincomycin Swelling and Rash  . Penicillins Rash     high fever    Family History  Problem Relation Age of Onset  . Prostate cancer Brother   . Stroke Mother   . Asthma Son   . Emphysema Father   . Lung cancer Sister     Prior to Admission medications   Medication Sig Start Date End Date Taking? Authorizing Provider  albuterol (PROVENTIL HFA;VENTOLIN HFA) 108 (90  BASE) MCG/ACT inhaler Inhale 2 puffs into the lungs every 6 (six) hours as needed for wheezing or shortness of breath.   Yes Historical Provider, MD  budesonide-formoterol (SYMBICORT) 160-4.5 MCG/ACT inhaler Inhale 2 puffs into the lungs 2 (two) times daily.   Yes Historical Provider, MD  citalopram (CELEXA) 40 MG tablet Take 40 mg by mouth daily.   Yes Historical Provider, MD  Colesevelam HCl 3.75 G PACK Take 1 packet by mouth every evening.   Yes Historical Provider, MD  divalproex (DEPAKOTE) 500 MG EC tablet Take 1,000 mg by mouth every evening.    Yes Historical Provider, MD  fluticasone (FLONASE) 50 MCG/ACT nasal spray Place 2 sprays into the nose every evening.    Yes Historical Provider, MD  furosemide (LASIX) 40 MG tablet Take 80 mg by mouth daily with breakfast.   Yes Historical Provider, MD  insulin aspart (NOVOLOG FLEXPEN) 100 UNIT/ML injection Inject 5 Units into the skin 3 (three) times daily before meals.    Yes Historical Provider, MD  insulin detemir (LEVEMIR FLEXPEN) 100 UNIT/ML injection Inject 30 Units into the skin daily with breakfast.    Yes Historical Provider, MD  omeprazole (PRILOSEC) 20 MG capsule Take 20 mg by mouth 2 (two) times daily.   Yes Historical Provider,  MD  potassium chloride (KLOR-CON) 10 MEQ CR tablet Take 20 mEq by mouth daily before breakfast.    Yes Historical Provider, MD  rOPINIRole (REQUIP) 3 MG tablet Take 3 mg by mouth 2 (two) times daily.     Yes Historical Provider, MD  Tetrahydrozoline HCl (VISINE OP) Place 1 drop into both eyes as needed (dry eyes).   Yes Historical Provider, MD  traMADol (ULTRAM) 50 MG tablet Take 50-100 mg by mouth 2 (two) times daily. Takes 1 tablet in the morning and 2 tablets at night 05/23/11  Yes Nyoka Cowden, MD   Physical Exam: Filed Vitals:   10/27/12 1459  BP: 112/57  Pulse: 82  Temp: 98.3 F (36.8 C)  Resp: 18     General:  Pleasant/cooperative, on 3LO2  Eyes: wnl  ENT: wnl  Neck: supple  Cardiovascular:  rrr, chest pain with palpation  Respiratory: decreased, mild wheezing  Abdomen: +BS, obese  Skin: no rashes or lesions  Musculoskeletal: moves all 4 ext, no edema  Psychiatric: normal  Neurologic: CN 2-12 intact  Labs on Admission:  Basic Metabolic Panel:  Recent Labs Lab 10/27/12 1244  NA 135  K 4.2  CL 98  CO2 29  GLUCOSE 90  BUN 18  CREATININE 0.73  CALCIUM 9.4   Liver Function Tests: No results found for this basename: AST, ALT, ALKPHOS, BILITOT, PROT, ALBUMIN,  in the last 168 hours No results found for this basename: LIPASE, AMYLASE,  in the last 168 hours No results found for this basename: AMMONIA,  in the last 168 hours CBC:  Recent Labs Lab 10/27/12 1244  WBC 7.0  NEUTROABS 4.2  HGB 11.1*  HCT 33.3*  MCV 89.3  PLT 226   Cardiac Enzymes: No results found for this basename: CKTOTAL, CKMB, CKMBINDEX, TROPONINI,  in the last 168 hours  BNP (last 3 results)  Recent Labs  05/05/12 2146 05/06/12 0600  PROBNP 228.7* 181.9*   CBG: No results found for this basename: GLUCAP,  in the last 168 hours  Radiological Exams on Admission: Dg Chest 2 View  10/27/2012   *RADIOLOGY REPORT*  Clinical Data: Shortness of breath  CHEST - 2 VIEW  Comparison: Chest radiograph 05/06/2012  Findings: The patient is rotated to the left.  Stable cardiac and mediastinal contours given differences in patient positioning.  No consolidative pulmonary opacity.  No pleural effusion or pneumothorax.  Regional skeleton is unremarkable.  IMPRESSION: No acute cardiopulmonary process.   Original Report Authenticated By: Annia Belt, M.D    EKG: Independently reviewed. sinus  Assessment/Plan Active Problems:   Diabetes mellitus type 2, controlled   PULMONARY SARCOIDOSIS   Chest pain, unspecified   Bronchitis   1. Chest pain- cycle CE, EKG ok, place on tele 2. Bronchitis: add abx and nebs 3. DM- continue home meds, SSI 4. pulm sarcoidosis- on home 3L dose    Code Status:  full Family Communication: daughter/patient Disposition Plan: obs  Time spent: 75 min  Shreyansh Tiffany [facility] Pager [phone]  If 7PM-7AM, please contact night-coverage [email or web address] Password [facility] 10/27/2012, 3:45 PM

## 2012-10-27 NOTE — Progress Notes (Signed)
1700 transferred in from ED via stretcher. Able to ambulate to BR and into  the bed without distress

## 2012-10-27 NOTE — Progress Notes (Signed)
  Subjective:    Patient ID: [facility], female    DOB: 24-Jul-1940, 72 y.o.   MRN: [id number]  HPI Pt states she has been coughing, hurting and tightness in chest. She states she was hurting in her left shoulder and back into her lung. She had a heart evaluation, ECHO and cardiolyte test more than 5 years ago. She quit smoking 45 years ago. She has pulmonary fibrosis. She sees Dr Delton Coombes.    Review of Systems     Objective:   Physical Exam patient is alert and cooperative she does not feel in any distress. Her neck is supple. Chest was decreased breath sounds in the bases with occasional rhonchi but no wheezes were heard her cardiac slightly irregular without murmurs rubs or gallops. Abdomen was soft nontender extremities are without edema calves are nontender        Assessment & Plan:  Patient with known pulmonary fibrosis and sarcoidosis with left-sided pressure-like sensation. Her baseline EKG is normal and she's been having pain for at least 2 days and has felt bad for 3-4 days. Dr. Delton Coombes called. Will have patient evaluated in the ER  By the EDP.

## 2012-10-27 NOTE — ED Notes (Signed)
Patient from urgent [facility] with C/O chest pain that began Saturday.  C/O pain on palpation and with breathing.

## 2012-10-27 NOTE — ED Provider Notes (Signed)
TIME SEEN: 12:20 PM  CHIEF COMPLAINT: Chest pain, shortness of breath, nausea, diaphoresis  HPI: Patient is a 72 year old female with a history of HTN, diabetes, hyperlipidemia, pulmonary fibrosis and sarcoidosis, pulmonary hypertension and CHF who presents emergency department with mild/moderate substernal chest pressure that radiated into her left shoulder that has been intermittent over the past 2 days. She states it is worse with exertion and better with rest. Her pain has been constant since 8 AM today. She has had shortness of breath, nausea and diaphoresis. She states this feels different than her pulmonary hypertension. No prior history of PE or DVT. No lower extremity swelling or pain. She is chronically on 3 L of oxygen at home. Her last stress test was many years ago. She has no prior history of cardiac catheterization per her report. Her PCP is Dr. Leonor Liv in [place].  Her cardiologist is at [facility].  ROS: See HPI Constitutional: no fever  Eyes: no drainage  ENT: no runny nose   Cardiovascular:   chest pain  Resp: SOB  GI: no vomiting GU: no dysuria Integumentary: no rash  Allergy: no hives  Musculoskeletal: no leg swelling  Neurological: no slurred speech ROS otherwise negative  PAST MEDICAL HISTORY/PAST SURGICAL HISTORY:  Past Medical History  Diagnosis Date  . HTN (hypertension)   . Diabetes mellitus   . Fibrosis of lung   . CHF (congestive heart failure)   . GERD (gastroesophageal reflux disease)   . Chronic headache     migraines    MEDICATIONS:  Prior to Admission medications   Medication Sig Start Date End Date Taking? Authorizing Provider  albuterol (PROVENTIL HFA;VENTOLIN HFA) 108 (90 BASE) MCG/ACT inhaler Inhale 2 puffs into the lungs every 6 (six) hours as needed for wheezing or shortness of breath.    Historical Provider, MD  budesonide-formoterol (SYMBICORT) 160-4.5 MCG/ACT inhaler Inhale 2 puffs into the lungs 2 (two) times daily.    Historical Provider, MD   citalopram (CELEXA) 40 MG tablet Take 0.5 tablets (20 mg total) by mouth every evening. 04/19/12   Shanker Levora Dredge, MD  colesevelam (WELCHOL) 625 MG tablet Take 1,875 mg by mouth daily.    Historical Provider, MD  divalproex (DEPAKOTE) 500 MG EC tablet Take 1,000 mg by mouth every evening.     Historical Provider, MD  fluticasone (FLONASE) 50 MCG/ACT nasal spray Place 2 sprays into the nose daily.     Historical Provider, MD  furosemide (LASIX) 40 MG tablet Take 1 tablet (40 mg total) by mouth daily before breakfast. 04/19/12   Maretta Bees, MD  insulin aspart (NOVOLOG FLEXPEN) 100 UNIT/ML injection Inject 5 Units into the skin 3 (three) times daily before meals.     Historical Provider, MD  insulin detemir (LEVEMIR FLEXPEN) 100 UNIT/ML injection Inject 30 Units into the skin daily with breakfast.     Historical Provider, MD  omeprazole (PRILOSEC) 20 MG capsule Take 1 capsule (20 mg total) by mouth 2 (two) times daily. 09/09/12   Leslye Peer, MD  potassium chloride (KLOR-CON) 10 MEQ CR tablet Take 20 mEq by mouth daily before breakfast.     Historical Provider, MD  rOPINIRole (REQUIP) 3 MG tablet Take 3 mg by mouth 2 (two) times daily.      Historical Provider, MD  traMADol (ULTRAM) 50 MG tablet Take 50 mg by mouth 3 (three) times daily. 05/23/11   Nyoka Cowden, MD    ALLERGIES:  Allergies  Allergen Reactions  . Clindamycin/Lincomycin Swelling and  Rash  . Penicillins Rash     high fever    SOCIAL HISTORY:  History  Substance Use Topics  . Smoking status: Former Smoker -- 1.00 packs/day for 8 years    Types: Cigarettes    Quit date: 02/20/1968  . Smokeless tobacco: Never Used  . Alcohol Use: No    FAMILY HISTORY: Family History  Problem Relation Age of Onset  . Prostate cancer Brother   . Stroke Mother   . Asthma Son   . Emphysema Father   . Lung cancer Sister     EXAM: There were no vitals taken for this visit. CONSTITUTIONAL: Alert and oriented and responds  appropriately to questions. Well-appearing; well-nourished HEAD: Normocephalic EYES: Conjunctivae clear, PERRL ENT: normal nose; no rhinorrhea; moist mucous membranes; pharynx without lesions noted NECK: Supple, no meningismus, no LAD  CARD: RRR; S1 and S2 appreciated; no murmurs, no clicks, no rubs, no gallops RESP: Normal chest excursion without splinting or tachypnea; breath sounds clear and equal bilaterally; no wheezes, no rhonchi, no rales,  ABD/GI: Normal bowel sounds; non-distended; soft, non-tender, no rebound, no guarding BACK:  The back appears normal and is non-tender to palpation, there is no CVA tenderness EXT: Normal ROM in all joints; non-tender to palpation; no edema; normal capillary refill; no cyanosis    SKIN: Normal color for age and race; warm NEURO: Moves all extremities equally PSYCH: The patient's mood and manner are appropriate. Grooming and personal hygiene are appropriate.  MEDICAL DECISION MAKING: Patient with multiple risk factors for ACS who presents the emergency department with chest pain is atypical of her pulmonary fibrosis and pulmonary hypertension. Will obtain cardiac labs, EKG, chest x-ray. Will give aspirin and nitroglycerin to get patient chest pain-free. Anticipate admission to the hospital.  ED PROGRESS: I stat troponin is 0.01.   Date: 10/27/2012 12:36 PM  Rate: 81  Rhythm: normal sinus rhythm  QRS Axis: normal  Intervals: normal  ST/T Wave abnormalities: normal  Conduction Disutrbances: none  Narrative Interpretation: unremarkable; no ischemic changes  3:04 PM  Pt is chest pain-free after nitroglycerin. She is still hemodynamically stable. Her labs including troponin are unremarkable. Chest x-ray clear. Discussed with patient and family and have recommended admission for chest pain rule out. Will discuss with medicine. Her PCP is at [facility].  3:20 PM  Spoke with Dr. Benjamine Mola for admission for chest pain rule out.  P[facility] Curry  Briyah Wheelwright, DO 10/27/12 1521

## 2012-10-28 ENCOUNTER — Encounter (HOSPITAL_COMMUNITY): Payer: Self-pay | Admitting: General Practice

## 2012-10-28 LAB — TSH: TSH: 2.541 u[IU]/mL (ref 0.350–4.500)

## 2012-10-28 LAB — BASIC METABOLIC PANEL
CO2: 29 mEq/L (ref 19–32)
GFR calc non Af Amer: 84 mL/min — ABNORMAL LOW (ref >90)
Glucose, Bld: 111 mg/dL — ABNORMAL HIGH (ref 70–99)
Potassium: 4.5 mEq/L (ref 3.5–5.1)
Sodium: 135 mEq/L (ref 135–145)

## 2012-10-28 LAB — CBC
Hemoglobin: 10.9 g/dL — ABNORMAL LOW (ref 12.0–15.0)
MCH: 29.5 pg (ref 26.0–34.0)
RBC: 3.7 MIL/uL — ABNORMAL LOW (ref 3.87–5.11)
WBC: 5.8 10*3/uL (ref 4.0–10.5)

## 2012-10-28 LAB — TROPONIN I
Troponin I: <0.3 ng/mL (ref <0.30)
Troponin I: <0.3 ng/mL (ref <0.30)

## 2012-10-28 LAB — GLUCOSE, CAPILLARY: Glucose-Capillary: 95 mg/dL (ref 70–99)

## 2012-10-28 MED ORDER — PREDNISONE 20 MG PO TABS
40.0000 mg | ORAL_TABLET | Freq: Every day | ORAL | Status: DC
  Filled 2012-10-28: qty 2

## 2012-10-28 MED ORDER — INFLUENZA VAC SPLIT QUAD 0.5 ML IM SUSP
0.5000 mL | INTRAMUSCULAR | Status: DC
  Filled 2012-10-28: qty 0.5

## 2012-10-28 MED ORDER — PREDNISONE 10 MG PO TABS: ORAL_TABLET | ORAL | Status: DC

## 2012-10-28 MED ORDER — METHYLPREDNISOLONE SODIUM SUCC 125 MG IJ SOLR
60.0000 mg | Freq: Once | INTRAMUSCULAR | Status: AC
  Administered 2012-10-28: 11:00:00 60 mg via INTRAVENOUS
  Filled 2012-10-28: qty 0.96

## 2012-10-28 MED ORDER — INFLUENZA VAC SPLIT QUAD 0.5 ML IM SUSP: 0.5000 mL | INTRAMUSCULAR | Status: DC

## 2012-10-28 MED ORDER — INFLUENZA VAC SPLIT QUAD 0.5 ML IM SUSP
0.5000 mL | Freq: Once | INTRAMUSCULAR | Status: AC
  Administered 2012-10-28: 14:00:00 0.5 mL via INTRAMUSCULAR
  Filled 2012-10-28: qty 0.5

## 2012-10-28 MED ORDER — LEVOFLOXACIN 750 MG PO TABS: 750.0000 mg | ORAL_TABLET | Freq: Every day | ORAL | Status: DC

## 2012-10-28 NOTE — Progress Notes (Signed)
Pt states she wears O2 at night and requests O2. Pt also states she vomited earlier tonight. Pt no longer nauseous and with no other complaints at this time. Will continue to monitor pt. Baron Hamper, RN

## 2012-10-28 NOTE — Discharge Summary (Signed)
Physician Discharge Summary  Penny Curry RUE:[id number] DOB: [birth year] DOA: 10/27/2012  PCP: Marylen Ponto, MD  Admit date: 10/27/2012 Discharge date: 10/28/2012  Time spent: 35 minutes  Recommendations for Outpatient Follow-up:  1. Follow up with PCP in 1 week, evaluate breathing.  Discharge Diagnoses:  Active Problems:   Diabetes mellitus type 2, controlled   PULMONARY SARCOIDOSIS   Chest pain, unspecified   Acute bronchitis   Discharge Condition: stable  Diet recommendation: heart healthy diet  Filed Weights   10/28/12 0524  Weight: 88 kg (194 lb 0.1 oz)    History of present illness:  72 y.o. female  On 3L of O2 at home. C/o coughing, hurting in her chest, and tightness. 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 Remote smoking hx. + fevers, + chills. Seen initially in urgent care and then sent to ER.  Chest pain was reproducible with palpation. Not requiring extra O2. In ER, her labs were WNL, EKG similar to previous. Pain worse with deep breathing.  Hospitalist were asked to obs for CP r/o  No dysuria, no nausea, no vomiting   Hospital Course:  Atypical Chest pain: - cycle CE negative x 3. - EKG unchanged form previous no events on telemetry. - reproducible by palpation unlike cardiac.  Acute Bronchitis:  - cont abx and nebs. - check sats on ambulation.  DM: - continue home meds.  pulm sarcoidosis: - on home 3L dose at night. Cont.    Procedures:  CXR    Consultations:  none  Discharge Exam: Filed Vitals:   10/28/12 0524  BP: 115/64  Pulse: 68  Temp: 97.7 F (36.5 C)  Resp: 18    General: A&O x3 Cardiovascular: RRR Respiratory: good air movement CTA B/L  Discharge Instructions      Discharge Orders   Future Appointments Provider Department Dept Phone   11/12/2012 11:00 AM [facility] [phone]   11/12/2012 12:00 PM Leslye Peer, MD [facility] [phone]   Future Orders Complete By Expires   Diet - low sodium heart healthy  As directed    Increase activity slowly  As directed        Medication List         albuterol 108 (90 BASE) MCG/ACT inhaler  Commonly known as:  PROVENTIL HFA;VENTOLIN HFA  Inhale 2 puffs into the lungs every 6 (six) hours as needed for wheezing or shortness of breath.     budesonide-formoterol 160-4.5 MCG/ACT inhaler  Commonly known as:  SYMBICORT  Inhale 2 puffs into the lungs 2 (two) times daily.     citalopram 40 MG tablet  Commonly known as:  CELEXA  Take 40 mg by mouth daily.     Colesevelam HCl 3.75 G Pack  Take 1 packet by mouth every evening.     divalproex 500 MG DR tablet  Commonly known as:  DEPAKOTE  Take 1,000 mg by mouth every evening.     fluticasone 50 MCG/ACT nasal spray  Commonly known as:  FLONASE  Place 2 sprays into the nose every evening.     furosemide 40 MG tablet  Commonly known as:  LASIX  Take 80 mg by mouth daily with breakfast.     LEVEMIR FLEXPEN 100 UNIT/ML injection  Generic drug:  insulin detemir  Inject 30 Units into the skin daily with breakfast.     levofloxacin 750 MG tablet  Commonly known as:  LEVAQUIN  Take 1 tablet (750 mg total) by mouth daily.     NOVOLOG FLEXPEN 100 UNIT/ML injection  Generic drug:  insulin aspart  Inject 5 Units into the skin 3 (three) times daily before meals.     omeprazole 20 MG capsule  Commonly known as:  PRILOSEC  Take 20 mg by mouth 2 (two) times daily.     potassium chloride 10 MEQ CR tablet  Commonly known as:  KLOR-CON  Take 20 mEq by mouth daily before breakfast.     predniSONE 10 MG tablet  Commonly known as:  DELTASONE  Takes 4 tablets for 1 days, then 3 tablets for 1 days, then 2 tabs for 1 days, then 1 tab for 1 days, and then stop.     rOPINIRole 3 MG tablet  Commonly known as:  REQUIP  Take 3 mg by mouth 2 (two) times daily.     traMADol 50 MG tablet  Commonly known as:   ULTRAM  Take 50-100 mg by mouth 2 (two) times daily. Takes 1 tablet in the morning and 2 tablets at night     VISINE OP  Place 1 drop into both eyes as needed (dry eyes).       Allergies  Allergen Reactions  . Tape     Skin to break out. Paper tape is ok.  . Clindamycin/Lincomycin Swelling and Rash  . Penicillins Rash     high fever   Follow-up Information   Follow up with HOLT,LYNLEY S, MD In 2 weeks. (hospital follow up 1 week.)    Specialty:  Family Medicine   Contact information:   [address] [place] [address] [phone]        The results of significant diagnostics from this hospitalization (including imaging, microbiology, ancillary and laboratory) are listed below for reference.    Significant Diagnostic Studies: Dg Chest 2 View  10/27/2012   *RADIOLOGY REPORT*  Clinical Data: Shortness of breath  CHEST - 2 VIEW  Comparison: Chest radiograph 05/06/2012  Findings: The patient is rotated to the left.  Stable cardiac and mediastinal contours given differences in patient positioning.  No consolidative pulmonary opacity.  No pleural effusion or pneumothorax.  Regional skeleton is unremarkable.  IMPRESSION: No acute cardiopulmonary process.   Original Report Authenticated By: Annia Belt, M.D    Microbiology: No results found for this or any previous visit (from the past 240 hour(s)).   Labs: Basic Metabolic Panel:  Recent Labs Lab 10/27/12 1244 10/28/12 0455  NA 135 135  K 4.2 4.5  CL 98 98  CO2 29 29  GLUCOSE 90 111*  BUN 18 17  CREATININE 0.73 0.72  CALCIUM 9.4 9.1   Liver Function Tests: No results found for this basename: AST, ALT, ALKPHOS, BILITOT, PROT, ALBUMIN,  in the last 168 hours No results found for this basename: LIPASE, AMYLASE,  in the last 168 hours No results found for this basename: AMMONIA,  in the last 168 hours CBC:  Recent Labs Lab 10/27/12 1244 10/28/12 0455  WBC 7.0 5.8  NEUTROABS 4.2  --   HGB 11.1* 10.9*  HCT  33.3* 33.2*  MCV 89.3 89.7  PLT 226 214   Cardiac Enzymes:  Recent Labs Lab 10/27/12 1845 10/27/12 2344 10/28/12 0455  TROPONINI <0.30 <0.30 <0.30   BNP: BNP (last 3 results)  Recent Labs  05/05/12 2146 05/06/12 0600  PROBNP 228.7* 181.9*   CBG:  Recent Labs Lab 10/27/12 2110 10/28/12 0548  GLUCAP 85 103*  Signed:  Marinda Elk  [facility] 10/28/2012, 8:44 AM

## 2012-10-28 NOTE — Progress Notes (Signed)
Utilization Review Completed.   Cattleya Dobratz, RN, BSN Nurse Case Manager  [phone]  

## 2012-10-28 NOTE — Progress Notes (Signed)
1300 discharge instructions given to pt . Verbalized understanding 1345 Wheled to lobby by volunteer.Went home with daughter

## 2012-10-28 NOTE — Progress Notes (Signed)
SATURATION QUALIFICATIONS: (This note is used to comply with regulatory documentation for home oxygen)  Patient Saturations on Room Air at Rest 95%  Patient Saturations on Room Air while Ambulating 95-94 %   Patient Saturations on 0 Liters of oxygen while Ambulating95%  Please briefly explain why patient needs home oxygen:

## 2012-11-10 ENCOUNTER — Telehealth: Payer: Self-pay | Admitting: Emergency Medicine

## 2012-11-10 NOTE — Telephone Encounter (Signed)
Per TD.  Kept appt as scheduled.  Notified pt.  Nothing further needed.  Antionette Fairy

## 2012-11-12 ENCOUNTER — Encounter: Payer: Self-pay | Admitting: Emergency Medicine

## 2012-11-12 ENCOUNTER — Ambulatory Visit (INDEPENDENT_AMBULATORY_CARE_PROVIDER_SITE_OTHER): Payer: Medicare Other | Admitting: Emergency Medicine

## 2012-11-12 VITALS — BP 130/70 | HR 94 | Temp 98.6°F | Ht 64.25 in | Wt 199.0 lb

## 2012-11-12 DIAGNOSIS — J302 Other seasonal allergic rhinitis: Secondary | ICD-10-CM

## 2012-11-12 DIAGNOSIS — D869 Sarcoidosis, unspecified: Secondary | ICD-10-CM

## 2012-11-12 DIAGNOSIS — J309 Allergic rhinitis, unspecified: Secondary | ICD-10-CM

## 2012-11-12 LAB — PULMONARY FUNCTION TEST

## 2012-11-12 MED ORDER — FLUTICASONE PROPIONATE 50 MCG/ACT NA SUSP: 2.0000 | Freq: Two times a day (BID) | NASAL | Status: DC

## 2012-11-12 NOTE — Patient Instructions (Addendum)
Please continue your Symbicort twice a day Please continue your albuterol as needed Increase your fluticasone nasal spray to twice a day.  Please start using your oxygen at 2L/min with exertion.  Follow with Dr Delton Coombes in 3 months or sooner if you have any problems.

## 2012-11-12 NOTE — Assessment & Plan Note (Signed)
With obstruction and restriction - highest yield thing here would be to use O2 w exertion, underscored this today - she already had flu shot - continue symbicort  - albuterol prn - rov 4

## 2012-11-12 NOTE — Progress Notes (Signed)
PFT done today. 

## 2012-11-12 NOTE — Assessment & Plan Note (Signed)
-   increase fluticasone nasal spray to bid

## 2012-11-12 NOTE — Progress Notes (Signed)
Subjective:    Patient ID: Aron Needles, female    DOB: 1940/12/14    MRN: [id number]  HPI 72 yo woman, former tobacco, recently dx w DM. Referred by Dr Helene Kelp for dyspnea and hx ILD followed by Dr DeCoy in [place], initially received dx of IPF. Underwent FOB and Bx in 2005, treated with pred and cytoxan in the past. Then had nodules on arm that were bx and showed probable sarcoidosis Summer 2011 ([place]). Last CT scan was prob 11/2009 (she isn't sure). 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   She has had an ONO that showed she needs O2 at night. She wears 3L/min at bedtime. She had PSG last summer [facility] - said she does not have OSA but needs O2.   ROV 05/01/10 -- f/u sarcoidosis, assoc ILD and PAH. TTE since last visit PASp ~ 36 mmHg. Saw Dr Helene Kelp about 10 days ago, has had about 5 weeks cough, congestion, low grade fever. Treated 2 rounds abx, most recently avelox by Dr Helene Kelp. No prednisone. Feels like she is improving, but still limited by SOB. No PFT on record, not currently on BDs.   ROV 06/23/10 -- sarcoidosis, assoc ILD, PAH. 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 Pseudo-normalization), decreased diffusion that corrects for Va. Has been feeling fairly well, was seen for abd pain, treated w cipro for ? Diverticulitis or UTI. Breathing has been fair, she believes that the heat and pollen make her worse. Not currently on allergy regimen. Has had some typical sarcoid rash on arms, has resolved (march 12).   ROV 01/05/11 -- 72 yo woman, sarcoidosis, assoc ILD, PAH. PFT 06/2010 = Moderate mixed disease (FEV1 1.63L, 75%). Regular f/u visit. Tells me that she is having more exertional SOB over last 2 months. Very little cough. She does hear some wheeze occasionally w exertion.  She has been having scratchy throat, some difficulty swallowing with choking - has been happening for a long time, but worse last 2 months.  She is on loratadine and omeprazole.    ROV 01/25/11 -- 72 yo woman, sarcoidosis, assoc ILD, PAH. PFT 06/2010 = Moderate mixed disease (FEV1 1.63L, 75%). She returns after having her swallowing eval and her CT chest. Need to get the CT scans from [place] [facility] to compare. She returns with more cough, green mucous, nasal gtt and congestion.   03/22/2011 Acute OV  Returns for persistent cough and congestion . Been seen at PCP and urgent care x 4  over last month at Urgent care for slow to resolve PNA . Currently on Avelox day 8/10. Finished prednisone 3 weeks ago without much help. She has been on 4 different abx including levaquin and avelox .  Still has cough and congestion with thick green mucus. Has some sinus pain and pressure with drainage. No vomitting or edema. No chest pain .  Low grade fevers on/off.  Cough is wearing her out. Take tussionex with some help but does not last. Too expensive.  >>prednisone burst   04/16/2011 Acute OV  Complains of persistent cough and congestion. Worse cough for last 1 week with green mucus, wheezing, increased SOB, tightness in chest, hoarseness, low grade temp x 1 week. Seen 1 month ago for similar symptoms. CXR report from urgent care with no acute process, tx w/ steroid burst, sample of symbicort and Avelox x 7 d . Had minimal improvement and now cough is getting worse. Has  nasal congestion and drainage. Was not able to get hydromet cough syrup due to cough. Cough is aggravating and keeping her up at night. No hemopytsis or chest pain . No n/v/d. No new meds. Currently on prednisone 20mg  daily  >>Levaquin x 10 d , CT sinus +   04/30/2011 Acute OV  Complains of persistent symptoms of cough and congestion . Complains of cough with yellow, sob, wheezing, chest pain, and fever up to 101. States was in the ED last night for  Bronchitis. Has been on several abx for last 2 months . Seen  2 weeks ago with Sarcoid flare /bronchitis and sinusitis . CT sinus showed Paranasal sinus mucosal thickening/opacification  most notable involving the maxillary sinuses (greater on the left) and right ethmoid sinus air cells.she was tx with Levaquin x 10 days and prednisone held at 20mg  daily . However she tapered off steroids -misunderstood directions.  She feels her cough never got better Was seen in ER at [facility] last night, told she had bronchitis and to. follow up in our office. She Was not given any rx. Told xray was neg for PNA. Records are not available.  She does have fever on/off. Tmax 101. Recent labs with no elevated wbc per pt/daughter.  Did have desats in ER to 87%. Wears O2 at sleep only.   ROV 05/04/11 -- 72 yo woman, sarcoidosis, assoc ILD, PAH. PFT 06/2010 = Moderate mixed disease (FEV1 1.63L, 75%). On omeprazole/lansoprazole, loratadine. Has been seen monthly as above for continued nasal congestion and cough - multiple rounds abx and pred with no lasting effect, tapered down to 20mg , now back up to 40mg . She is on Symbicort - restarted ??. Her sinus CT scan shows chronic sinusitis. She has been having low grade fevers. Blows yellow green out of her nose.  rec Clindamycin x 6 weeks, pred taper off  05/23/2011 f/u ov/Wert cc intermittent fever to 101 and mucus dark turned lighter yellow while on clindamycin but stopped p 2 weeks due to rash, no better on prednisone, no worse off it, no worse off symbicort p ran out if it a week prior to OV .  Mod nasal congestion. No using ppi timed to ac. No sinus or tooth pain. Sob not changed from baseline >>GERD tx   05/15/12 post hospital follow up     HFU - reports doing well overall but still weak and has decreased energy.  Patient was recently admitted with pulmonary hypertension, and pulmonary sarcoidosis. She was felt to have decompensated cor pulmonale likely secondary to pulmonary hypertension. She improved with diuresis. She was Recommended on O2 compliance. 2-D echo showed normal ejection fracture 55-60% mild to moderate mitral valve regurgitation. Left atrium  was moderately dilated. Venous Dopplers were negative. Patient denies any hemoptysis, orthopnea, PND, or increased leg swelling.   Does complain of urinary urgency at times. Wants urine checked   ROV 07/09/12 -- sarcoidosis, assoc ILD, PAH. PFT 06/2010 = Moderate mixed disease (FEV1 1.63L, 75%). Many of her visits have been acute OV's for cough, congestion, bronchospasm. TTE 3/14 > intact LV fxn, normal RV size and fxn.  She has had some cough, she is on fluticasone. Omeprazole bid > still some breakthrough GERD sx.   ROV 11/12/12 -- sarcoidosis, assoc ILD, PAH. PFT 06/2010 = Moderate mixed disease (FEV1 1.63L, 75%). Many of her visits have been acute OV's for cough, congestion, bronchospasm. We performed repeat PFT today >> mixed disease with decrease in FEV1 to 1.43L. She does not wear  her O2 with exertion. She c/o fatigue, dyspnea, "just not feeling good". On symbicort, fluticasone nasal spray.       Objective:   Physical Exam Filed Vitals:   11/12/12 1156  BP: 130/70  Pulse: 94  Temp: 98.6 F (37 C)  TempSrc: Oral  Height: 5' 4.25" (1.632 m)  Weight: 199 lb (90.266 kg)  SpO2: 95%   Gen: anxious amb very hoarse wf nad   ENT: No lesions,  mouth clear,  oropharynx clear, no postnasal drip  Neck: No JVD, no TMG, no carotid bruits  Lungs: No use of accessory muscles, no dullness to percussion, Coarse BS w/ no wheezing  Harsh barking cough  Cardiovascular: RRR, heart sounds normal, no murmur or gallops, tr  peripheral edema  Musculoskeletal: No deformities, no cyanosis or clubbing  Neuro: alert, non focal  Skin: Warm, no lesions or rashes   CXR 05/05/12 --  Comparison: 05/05/2012 05/01/2010  Findings: Increased lung markings may represent decreased lung  volumes. Heart and mediastinum are grossly stable. There is no  evidence of focal airspace disease.  IMPRESSION: Prominent lung markings may represent decreased lung  volumes and chronic changes. Difficult to exclude subtle  nodular  disease.  No acute chest findings or airspace disease     Assessment & Plan:  PULMONARY SARCOIDOSIS With obstruction and restriction - highest yield thing here would be to use O2 w exertion, underscored this today - she already had flu shot - continue symbicort  - albuterol prn - rov 4  Allergic rhinitis, seasonal - increase fluticasone nasal spray to bid

## 2013-02-26 ENCOUNTER — Ambulatory Visit (INDEPENDENT_AMBULATORY_CARE_PROVIDER_SITE_OTHER): Payer: Medicare Other | Admitting: Emergency Medicine

## 2013-02-26 ENCOUNTER — Encounter: Payer: Self-pay | Admitting: Emergency Medicine

## 2013-02-26 VITALS — BP 140/68 | HR 95 | Ht 65.0 in | Wt 194.6 lb

## 2013-02-26 DIAGNOSIS — J309 Allergic rhinitis, unspecified: Secondary | ICD-10-CM

## 2013-02-26 DIAGNOSIS — J302 Other seasonal allergic rhinitis: Secondary | ICD-10-CM

## 2013-02-26 DIAGNOSIS — R05 Cough: Secondary | ICD-10-CM

## 2013-02-26 DIAGNOSIS — R059 Cough, unspecified: Secondary | ICD-10-CM

## 2013-02-26 DIAGNOSIS — D869 Sarcoidosis, unspecified: Secondary | ICD-10-CM

## 2013-02-26 DIAGNOSIS — R053 Chronic cough: Secondary | ICD-10-CM

## 2013-02-26 MED ORDER — LORATADINE 10 MG PO TABS: 10.0000 mg | ORAL_TABLET | Freq: Every day | ORAL | Status: DC

## 2013-02-26 NOTE — Patient Instructions (Signed)
Please continue your Symbicort twice a day Continue your fluticasone  Start loratadine (Claritin) 10mg  daily Walking oximetry tiday We will discuss the timing of your next CXR at your next visit.  Follow with Dr Lamonte Sakai in 4 months or sooner if you have any problems.

## 2013-02-26 NOTE — Progress Notes (Signed)
Subjective:    Patient ID: Penny Curry, female    DOB: Oct 01, 1940    MRN: [id number]  HPI 73 yo woman, former tobacco, recently dx w DM. Referred by Dr Helene Kelp for dyspnea and hx ILD followed by Dr DeCoy in [place], initially received dx of IPF. Underwent FOB and Bx in 2005, treated with pred and cytoxan in the past. Then had nodules on arm that were bx and showed probable sarcoidosis Summer 2011 ([place]). Last CT scan was prob 11/2009 (she isn't sure). 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   She has had an ONO that showed she needs O2 at night. She wears 3L/min at bedtime. She had PSG last summer [facility] - said she does not have OSA but needs O2.   ROV 05/01/10 -- f/u sarcoidosis, assoc ILD and PAH. TTE since last visit PASp ~ 36 mmHg. Saw Dr Helene Kelp about 10 days ago, has had about 5 weeks cough, congestion, low grade fever. Treated 2 rounds abx, most recently avelox by Dr Helene Kelp. No prednisone. Feels like she is improving, but still limited by SOB. No PFT on record, not currently on BDs.   ROV 06/23/10 -- sarcoidosis, assoc ILD, PAH. 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 Pseudo-normalization), decreased diffusion that corrects for Va. Has been feeling fairly well, was seen for abd pain, treated w cipro for ? Diverticulitis or UTI. Breathing has been fair, she believes that the heat and pollen make her worse. Not currently on allergy regimen. Has had some typical sarcoid rash on arms, has resolved (march 12).   ROV 01/05/11 -- 73 yo woman, sarcoidosis, assoc ILD, PAH. PFT 06/2010 = Moderate mixed disease (FEV1 1.63L, 75%). Regular f/u visit. Tells me that she is having more exertional SOB over last 2 months. Very little cough. She does hear some wheeze occasionally w exertion.  She has been having scratchy throat, some difficulty swallowing with choking - has been happening for a long time, but worse last 2 months.  She is on loratadine and omeprazole.    ROV 01/25/11 -- 73 yo woman, sarcoidosis, assoc ILD, PAH. PFT 06/2010 = Moderate mixed disease (FEV1 1.63L, 75%). She returns after having her swallowing eval and her CT chest. Need to get the CT scans from [place] [facility] to compare. She returns with more cough, green mucous, nasal gtt and congestion.   03/22/2011 Acute OV  Returns for persistent cough and congestion . Been seen at PCP and urgent care x 4  over last month at Urgent care for slow to resolve PNA . Currently on Avelox day 8/10. Finished prednisone 3 weeks ago without much help. She has been on 4 different abx including levaquin and avelox .  Still has cough and congestion with thick green mucus. Has some sinus pain and pressure with drainage. No vomitting or edema. No chest pain .  Low grade fevers on/off.  Cough is wearing her out. Take tussionex with some help but does not last. Too expensive.  >>prednisone burst   04/16/2011 Acute OV  Complains of persistent cough and congestion. Worse cough for last 1 week with green mucus, wheezing, increased SOB, tightness in chest, hoarseness, low grade temp x 1 week. Seen 1 month ago for similar symptoms. CXR report from urgent care with no acute process, tx w/ steroid burst, sample of symbicort and Avelox x 7 d . Had minimal improvement and now cough is getting worse. Has  nasal congestion and drainage. Was not able to get hydromet cough syrup due to cough. Cough is aggravating and keeping her up at night. No hemopytsis or chest pain . No n/v/d. No new meds. Currently on prednisone 20mg  daily  >>Levaquin x 10 d , CT sinus +   04/30/2011 Acute OV  Complains of persistent symptoms of cough and congestion . Complains of cough with yellow, sob, wheezing, chest pain, and fever up to 101. States was in the ED last night for  Bronchitis. Has been on several abx for last 2 months . Seen  2 weeks ago with Sarcoid flare /bronchitis and sinusitis . CT sinus showed Paranasal sinus mucosal thickening/opacification  most notable involving the maxillary sinuses (greater on the left) and right ethmoid sinus air cells.she was tx with Levaquin x 10 days and prednisone held at 20mg  daily . However she tapered off steroids -misunderstood directions.  She feels her cough never got better Was seen in ER at [facility] last night, told she had bronchitis and to. follow up in our office. She Was not given any rx. Told xray was neg for PNA. Records are not available.  She does have fever on/off. Tmax 101. Recent labs with no elevated wbc per pt/daughter.  Did have desats in ER to 87%. Wears O2 at sleep only.   ROV 05/04/11 -- 73 yo woman, sarcoidosis, assoc ILD, PAH. PFT 06/2010 = Moderate mixed disease (FEV1 1.63L, 75%). On omeprazole/lansoprazole, loratadine. Has been seen monthly as above for continued nasal congestion and cough - multiple rounds abx and pred with no lasting effect, tapered down to 20mg , now back up to 40mg . She is on Symbicort - restarted ??. Her sinus CT scan shows chronic sinusitis. She has been having low grade fevers. Blows yellow green out of her nose.  rec Clindamycin x 6 weeks, pred taper off  05/23/2011 f/u ov/Wert cc intermittent fever to 101 and mucus dark turned lighter yellow while on clindamycin but stopped p 2 weeks due to rash, no better on prednisone, no worse off it, no worse off symbicort p ran out if it a week prior to OV .  Mod nasal congestion. No using ppi timed to ac. No sinus or tooth pain. Sob not changed from baseline >>GERD tx   05/15/12 post hospital follow up     HFU - reports doing well overall but still weak and has decreased energy.  Patient was recently admitted with pulmonary hypertension, and pulmonary sarcoidosis. She was felt to have decompensated cor pulmonale likely secondary to pulmonary hypertension. She improved with diuresis. She was Recommended on O2 compliance. 2-D echo showed normal ejection fracture 55-60% mild to moderate mitral valve regurgitation. Left atrium  was moderately dilated. Venous Dopplers were negative. Patient denies any hemoptysis, orthopnea, PND, or increased leg swelling.   Does complain of urinary urgency at times. Wants urine checked   ROV 07/09/12 -- sarcoidosis, assoc ILD, PAH. PFT 06/2010 = Moderate mixed disease (FEV1 1.63L, 75%). Many of her visits have been acute OV's for cough, congestion, bronchospasm. TTE 3/14 > intact LV fxn, normal RV size and fxn.  She has had some cough, she is on fluticasone. Omeprazole bid > still some breakthrough GERD sx.   ROV 11/12/12 -- sarcoidosis, assoc ILD, PAH. PFT 06/2010 = Moderate mixed disease (FEV1 1.63L, 75%). Many of her visits have been acute OV's for cough, congestion, bronchospasm. We performed repeat PFT today >> mixed disease with decrease in FEV1 to 1.43L. She does not wear  her O2 with exertion. She c/o fatigue, dyspnea, "just not feeling good". On symbicort, fluticasone nasal spray.   ROV 02/25/13 -- f/u for sarcoidosis, assoc ILD, mixed obstructive and restrictive disease. She has had trouble with cough, prod of thick yellow phlegm and then green phlegm. Was treated with abx in 11/'14 and the phlegm cleared. Continued to have sinus drainage. Then had recurrent bout of bronchitis end of Dec. Just finished a round of levaquin. She still has hoarse voice, cough.  She is using fluticasone, generic benadryl some nights. She is now on pantoprazole (was on omeprazole). Just discovered that her Vit D is low.       Objective:   Physical Exam Filed Vitals:   02/26/13 1607  BP: 140/68  Pulse: 95  Height: 5\' 5"  (1.651 m)  Weight: 194 lb 9.6 oz (88.27 kg)  SpO2: 92%   Gen: anxious amb very hoarse wf nad   ENT: No lesions,  mouth clear,  oropharynx clear, no postnasal drip  Neck: No JVD, no TMG, no carotid bruits  Lungs: No use of accessory muscles, no dullness to percussion, Coarse BS w/ no wheezing  Harsh barking cough  Cardiovascular: RRR, heart sounds normal, no murmur or gallops, tr   peripheral edema  Musculoskeletal: No deformities, no cyanosis or clubbing  Neuro: alert, non focal  Skin: Warm, no lesions or rashes   CXR 05/05/12 --  Comparison: 05/05/2012 05/01/2010  Findings: Increased lung markings may represent decreased lung  volumes. Heart and mediastinum are grossly stable. There is no  evidence of focal airspace disease.  IMPRESSION: Prominent lung markings may represent decreased lung  volumes and chronic changes. Difficult to exclude subtle nodular  disease.  No acute chest findings or airspace disease     Assessment & Plan:  PULMONARY SARCOIDOSIS - continue symbicort bid - walking oximetry today - albuterol prn - discuss timing of repeat CXR next time.    Allergic rhinitis, seasonal - continue fluticasone - add loratadine qd  Chronic cough - her omeprazole was changed to pantoprazole.

## 2013-02-26 NOTE — Assessment & Plan Note (Signed)
-   continue fluticasone - add loratadine qd

## 2013-02-26 NOTE — Assessment & Plan Note (Addendum)
-   continue symbicort bid - walking oximetry today - albuterol prn - discuss timing of repeat CXR next time.

## 2013-02-26 NOTE — Assessment & Plan Note (Signed)
-   her omeprazole was changed to pantoprazole.

## 2013-03-09 ENCOUNTER — Emergency Department (HOSPITAL_COMMUNITY): Payer: Medicare Other

## 2013-03-09 ENCOUNTER — Emergency Department (HOSPITAL_COMMUNITY)
Admission: EM | Admit: 2013-03-09 | Discharge: 2013-03-09 | Disposition: A | Discharge status: Home / Self Care | Payer: Medicare Other | Attending: Emergency Medicine | Admitting: Emergency Medicine

## 2013-03-09 ENCOUNTER — Encounter (HOSPITAL_COMMUNITY): Payer: Self-pay | Admitting: Emergency Medicine

## 2013-03-09 DIAGNOSIS — K219 Gastro-esophageal reflux disease without esophagitis: Secondary | ICD-10-CM | POA: Insufficient documentation

## 2013-03-09 DIAGNOSIS — Z8669 Personal history of other diseases of the nervous system and sense organs: Secondary | ICD-10-CM | POA: Insufficient documentation

## 2013-03-09 DIAGNOSIS — J8410 Pulmonary fibrosis, unspecified: Secondary | ICD-10-CM | POA: Insufficient documentation

## 2013-03-09 DIAGNOSIS — R509 Fever, unspecified: Secondary | ICD-10-CM | POA: Insufficient documentation

## 2013-03-09 DIAGNOSIS — M129 Arthropathy, unspecified: Secondary | ICD-10-CM | POA: Insufficient documentation

## 2013-03-09 DIAGNOSIS — Z87440 Personal history of urinary (tract) infections: Secondary | ICD-10-CM | POA: Insufficient documentation

## 2013-03-09 DIAGNOSIS — Z880 Allergy status to penicillin: Secondary | ICD-10-CM | POA: Insufficient documentation

## 2013-03-09 DIAGNOSIS — I509 Heart failure, unspecified: Secondary | ICD-10-CM | POA: Insufficient documentation

## 2013-03-09 DIAGNOSIS — Z881 Allergy status to other antibiotic agents status: Secondary | ICD-10-CM | POA: Insufficient documentation

## 2013-03-09 DIAGNOSIS — J189 Pneumonia, unspecified organism: Secondary | ICD-10-CM | POA: Insufficient documentation

## 2013-03-09 DIAGNOSIS — R9431 Abnormal electrocardiogram [ECG] [EKG]: Secondary | ICD-10-CM | POA: Insufficient documentation

## 2013-03-09 DIAGNOSIS — Z794 Long term (current) use of insulin: Secondary | ICD-10-CM | POA: Insufficient documentation

## 2013-03-09 DIAGNOSIS — E119 Type 2 diabetes mellitus without complications: Secondary | ICD-10-CM | POA: Insufficient documentation

## 2013-03-09 DIAGNOSIS — Z87891 Personal history of nicotine dependence: Secondary | ICD-10-CM | POA: Insufficient documentation

## 2013-03-09 DIAGNOSIS — I10 Essential (primary) hypertension: Secondary | ICD-10-CM | POA: Insufficient documentation

## 2013-03-09 DIAGNOSIS — Z79899 Other long term (current) drug therapy: Secondary | ICD-10-CM | POA: Insufficient documentation

## 2013-03-09 DIAGNOSIS — R51 Headache: Secondary | ICD-10-CM | POA: Insufficient documentation

## 2013-03-09 DIAGNOSIS — B349 Viral infection, unspecified: Secondary | ICD-10-CM

## 2013-03-09 DIAGNOSIS — B9789 Other viral agents as the cause of diseases classified elsewhere: Secondary | ICD-10-CM | POA: Insufficient documentation

## 2013-03-09 DIAGNOSIS — R000 Tachycardia, unspecified: Secondary | ICD-10-CM | POA: Insufficient documentation

## 2013-03-09 DIAGNOSIS — R0602 Shortness of breath: Secondary | ICD-10-CM | POA: Insufficient documentation

## 2013-03-09 DIAGNOSIS — J3489 Other specified disorders of nose and nasal sinuses: Secondary | ICD-10-CM | POA: Insufficient documentation

## 2013-03-09 LAB — COMPREHENSIVE METABOLIC PANEL
ALT: 24 U/L (ref 0–35)
AST: 29 U/L (ref 0–37)
Albumin: 3.5 g/dL (ref 3.5–5.2)
Alkaline Phosphatase: 82 U/L (ref 39–117)
BILIRUBIN TOTAL: 0.2 mg/dL — AB (ref 0.3–1.2)
BUN: 18 mg/dL (ref 6–23)
CHLORIDE: 93 meq/L — AB (ref 96–112)
CO2: 27 meq/L (ref 19–32)
Calcium: 9 mg/dL (ref 8.4–10.5)
Creatinine, Ser: 0.8 mg/dL (ref 0.50–1.10)
GFR calc Af Amer: 84 mL/min — ABNORMAL LOW (ref >90)
GFR, EST NON AFRICAN AMERICAN: 72 mL/min — AB (ref >90)
Glucose, Bld: 111 mg/dL — ABNORMAL HIGH (ref 70–99)
POTASSIUM: 4.2 meq/L (ref 3.7–5.3)
SODIUM: 133 meq/L — AB (ref 137–147)
Total Protein: 8 g/dL (ref 6.0–8.3)

## 2013-03-09 LAB — CBC WITH DIFFERENTIAL/PLATELET
BASOS ABS: 0 10*3/uL (ref 0.0–0.1)
Basophils Relative: 0 % (ref 0–1)
Eosinophils Absolute: 0.1 10*3/uL (ref 0.0–0.7)
Eosinophils Relative: 1 % (ref 0–5)
HCT: 34.3 % — ABNORMAL LOW (ref 36.0–46.0)
Hemoglobin: 11.6 g/dL — ABNORMAL LOW (ref 12.0–15.0)
LYMPHS PCT: 23 % (ref 12–46)
Lymphs Abs: 2.4 10*3/uL (ref 0.7–4.0)
MCH: 30 pg (ref 26.0–34.0)
MCHC: 33.8 g/dL (ref 30.0–36.0)
MCV: 88.6 fL (ref 78.0–100.0)
Monocytes Absolute: 0.7 10*3/uL (ref 0.1–1.0)
Monocytes Relative: 6 % (ref 3–12)
NEUTROS ABS: 7.3 10*3/uL (ref 1.7–7.7)
Neutrophils Relative %: 70 % (ref 43–77)
PLATELETS: 236 10*3/uL (ref 150–400)
RBC: 3.87 MIL/uL (ref 3.87–5.11)
RDW: 14.5 % (ref 11.5–15.5)
WBC: 10.5 10*3/uL (ref 4.0–10.5)

## 2013-03-09 MED ORDER — DEXTROSE 5 % IV SOLN
500.0000 mg | Freq: Once | INTRAVENOUS | Status: AC
  Administered 2013-03-09: 500 mg via INTRAVENOUS

## 2013-03-09 MED ORDER — LEVOFLOXACIN 500 MG PO TABS: 500.0000 mg | ORAL_TABLET | Freq: Every day | ORAL | Status: DC

## 2013-03-09 MED ORDER — ACETAMINOPHEN 500 MG PO TABS
1000.0000 mg | ORAL_TABLET | Freq: Once | ORAL | Status: AC
  Administered 2013-03-09: 1000 mg via ORAL
  Filled 2013-03-09: qty 2

## 2013-03-09 MED ORDER — SODIUM CHLORIDE 0.9 % IV BOLUS (SEPSIS): 1000.0000 mL | Freq: Once | INTRAVENOUS | Status: DC

## 2013-03-09 MED ORDER — SODIUM CHLORIDE 0.9 % IV BOLUS (SEPSIS)
500.0000 mL | Freq: Once | INTRAVENOUS | Status: AC
  Administered 2013-03-09: 500 mL via INTRAVENOUS

## 2013-03-09 MED ORDER — IBUPROFEN 400 MG PO TABS: 600.0000 mg | ORAL_TABLET | Freq: Once | ORAL | Status: DC

## 2013-03-09 NOTE — ED Notes (Signed)
Patient transported to X-ray 

## 2013-03-09 NOTE — Discharge Instructions (Signed)
Follow up with your PCP in 1-2 days, take your abx as prescribed.  Bronchitis Bronchitis is inflammation of the airways that extend from the windpipe into the lungs (bronchi). The inflammation often causes mucus to develop, which leads to a cough. If the inflammation becomes severe, it may cause shortness of breath. CAUSES  Bronchitis may be caused by:   Viral infections.   Bacteria.   Cigarette smoke.   Allergens, pollutants, and other irritants.  SIGNS AND SYMPTOMS  The most common symptom of bronchitis is a frequent cough that produces mucus. Other symptoms include:  Fever.   Body aches.   Chest congestion.   Chills.   Shortness of breath.   Sore throat.  DIAGNOSIS  Bronchitis is usually diagnosed through a medical history and physical exam. Tests, such as chest X-rays, are sometimes done to rule out other conditions.  TREATMENT  You may need to avoid contact with whatever caused the problem (smoking, for example). Medicines are sometimes needed. These may include:  Antibiotics. These may be prescribed if the condition is caused by bacteria.  Cough suppressants. These may be prescribed for relief of cough symptoms.   Inhaled medicines. These may be prescribed to help open your airways and make it easier for you to breathe.   Steroid medicines. These may be prescribed for those with recurrent (chronic) bronchitis. HOME CARE INSTRUCTIONS  Get plenty of rest.   Drink enough fluids to keep your urine clear or pale yellow (unless you have a medical condition that requires fluid restriction). Increasing fluids may help thin your secretions and will prevent dehydration.   Only take over-the-counter or prescription medicines as directed by your health care provider.  Only take antibiotics as directed. Make sure you finish them even if you start to feel better.  Avoid secondhand smoke, irritating chemicals, and strong fumes. These will make bronchitis worse.  If you are a smoker, quit smoking. Consider using nicotine gum or skin patches to help control withdrawal symptoms. Quitting smoking will help your lungs heal faster.   Put a cool-mist humidifier in your bedroom at night to moisten the air. This may help loosen mucus. Change the water in the humidifier daily. You can also run the hot water in your shower and sit in the bathroom with the door closed for 5 10 minutes.   Follow up with your health care provider as directed.   Wash your hands frequently to avoid catching bronchitis again or spreading an infection to others.  SEEK MEDICAL CARE IF: Your symptoms do not improve after 1 week of treatment.  SEEK IMMEDIATE MEDICAL CARE IF:  Your fever increases.  You have chills.   You have chest pain.   You have worsening shortness of breath.   You have bloody sputum.  You faint.  You have lightheadedness.  You have a severe headache.   You vomit repeatedly. MAKE SURE YOU:   Understand these instructions.  Will watch your condition.  Will get help right away if you are not doing well or get worse. Document Released: 02/05/2005 Document Revised: 11/26/2012 Document Reviewed: 09/30/2012 [facility] Patient Information 2014 [place].

## 2013-03-09 NOTE — ED Provider Notes (Signed)
73 year old female history of sarcoidosis has had a cough for the last several days. She's not had a fever or chills. She went to an urgent care Center where x-ray was concerning for pneumonia and she was given a dose of ceftriaxone and sent to the ED. On exam, she is resting comfortably. Lungs are clear and heart is regular rate and rhythm. Chest x-ray done in the ED shows no evidence of pneumonia and WBC is normal. I do not see an indication for hospitalization at this time and she will be discharged with a prescription for antibiotics and followup with her PCP.  Results for orders placed during the hospital encounter of 03/09/13  CBC WITH DIFFERENTIAL      Result Value Range   WBC 10.5  4.0 - 10.5 K/uL   RBC 3.87  3.87 - 5.11 MIL/uL   Hemoglobin 11.6 (*) 12.0 - 15.0 g/dL   HCT 34.3 (*) 36.0 - 46.0 %   MCV 88.6  78.0 - 100.0 fL   MCH 30.0  26.0 - 34.0 pg   MCHC 33.8  30.0 - 36.0 g/dL   RDW 14.5  11.5 - 15.5 %   Platelets 236  150 - 400 K/uL   Neutrophils Relative % 70  43 - 77 %   Neutro Abs 7.3  1.7 - 7.7 K/uL   Lymphocytes Relative 23  12 - 46 %   Lymphs Abs 2.4  0.7 - 4.0 K/uL   Monocytes Relative 6  3 - 12 %   Monocytes Absolute 0.7  0.1 - 1.0 K/uL   Eosinophils Relative 1  0 - 5 %   Eosinophils Absolute 0.1  0.0 - 0.7 K/uL   Basophils Relative 0  0 - 1 %   Basophils Absolute 0.0  0.0 - 0.1 K/uL  COMPREHENSIVE METABOLIC PANEL      Result Value Range   Sodium 133 (*) 137 - 147 mEq/L   Potassium 4.2  3.7 - 5.3 mEq/L   Chloride 93 (*) 96 - 112 mEq/L   CO2 27  19 - 32 mEq/L   Glucose, Bld 111 (*) 70 - 99 mg/dL   BUN 18  6 - 23 mg/dL   Creatinine, Ser 0.80  0.50 - 1.10 mg/dL   Calcium 9.0  8.4 - 10.5 mg/dL   Total Protein 8.0  6.0 - 8.3 g/dL   Albumin 3.5  3.5 - 5.2 g/dL   AST 29  0 - 37 U/L   ALT 24  0 - 35 U/L   Alkaline Phosphatase 82  39 - 117 U/L   Total Bilirubin 0.2 (*) 0.3 - 1.2 mg/dL   GFR calc non Af Amer 72 (*) >90 mL/min   GFR calc Af Amer 84 (*) >90 mL/min    Dg Chest 2 View  03/09/2013   CLINICAL DATA:  Cough. History of pneumonia and sarcoidosis. Pulmonary fibrosis.  EXAM: CHEST  2 VIEW  COMPARISON:  DG CHEST 2 VIEW dated 10/27/2012; DG CHEST 2 VIEW dated 04/16/2012; CT CHEST W/O CM dated 01/09/2011  FINDINGS: Mild hyperinflation. Midline trachea. Normal heart size and mediastinal contours. No pleural effusion or pneumothorax. Moderate interstitial thickening which is similar to on the prior exam and nonspecific. No lobar consolidation.  IMPRESSION: Similar moderate interstitial thickening, which is nonspecific but could be related to the clinical history of sarcoidosis. No evidence of acute superimposed process.   Electronically Signed   By: Abigail Miyamoto M.D.   On: 03/09/2013 22:42   Images viewed  by me.  I saw and evaluated the patient, reviewed the resident's note and I agree with the findings and plan.  EKG Interpretation    Date/Time:  Monday March 09 2013 19:41:59 EST Ventricular Rate:  103 PR Interval:  126 QRS Duration: 72 QT Interval:  358 QTC Calculation: 468 R Axis:   78 Text Interpretation:  Sinus tachycardia with Premature atrial complexes ST abnormality, possible digitalis effect Abnormal ECG When compared with ECG of 10/27/2012, Premature atrial complexes are now Present Confirmed by Roxanne Mins  MD, Roshelle Traub ([id number]) on 03/09/2013 7:57:56 PM              Delora Fuel, MD 31/43/88 [id number]

## 2013-03-09 NOTE — ED Notes (Signed)
Exp wheezing heard bilaterally; pt reports has been ill for approx one month.  Has been on antibiotics, not feeling better.  Also reports she has pulm fibrosis.

## 2013-03-09 NOTE — ED Provider Notes (Signed)
CSN: [id number]     Arrival date & time 03/09/13  1925 History   First MD Initiated Contact with Patient 03/09/13 2121     Chief Complaint  Patient presents with  . Pneumonia  . Cough   (Consider location/radiation/quality/duration/timing/severity/associated sxs/prior Treatment) Patient is a 73 y.o. female presenting with general illness. The history is provided by the patient.  Illness Severity:  Moderate Onset quality:  Gradual Duration:  1 week Timing:  Constant Progression:  Unchanged Chronicity:  New Associated symptoms: congestion, cough, fever and headaches   Associated symptoms: no chest pain, no loss of consciousness, no myalgias, no nausea, no rhinorrhea, no shortness of breath, no sore throat, no vomiting and no wheezing     Past Medical History  Diagnosis Date  . HTN (hypertension)   . Diabetes mellitus   . Fibrosis of lung   . CHF (congestive heart failure)   . GERD (gastroesophageal reflux disease)   . Chronic headache     migraines  . Complication of anesthesia   . PONV (postoperative nausea and vomiting)   . Shortness of breath   . UTI (urinary tract infection)     " MULTIPLE TIMES THIS PAST YEAR "  . Arthritis    Past Surgical History  Procedure Laterality Date  . Neck surgery  1987  . Partial hysterectomy    . Tubal ligation    . Cataract extraction    . Abdominal hysterectomy     Family History  Problem Relation Age of Onset  . Prostate cancer Brother   . Stroke Mother   . Asthma Son   . Emphysema Father   . Lung cancer Sister    History  Substance Use Topics  . Smoking status: Former Smoker -- 1.00 packs/day for 8 years    Types: Cigarettes    Quit date: 02/20/1968  . Smokeless tobacco: Never Used  . Alcohol Use: No   OB History   Grav Para Term Preterm Abortions TAB SAB Ect Mult Living                 Review of Systems  Constitutional: Positive for fever. Negative for chills.  HENT: Positive for congestion. Negative for  rhinorrhea and sore throat.   Eyes: Negative for redness and visual disturbance.  Respiratory: Positive for cough. Negative for shortness of breath and wheezing.   Cardiovascular: Negative for chest pain and palpitations.  Gastrointestinal: Negative for nausea and vomiting.  Genitourinary: Negative for dysuria and urgency.  Musculoskeletal: Negative for arthralgias and myalgias.  Skin: Negative for pallor and wound.  Neurological: Positive for headaches. Negative for dizziness and loss of consciousness.    Allergies  Clindamycin/lincomycin; Penicillins; and Tape  Home Medications   Current Outpatient Rx  Name  Route  Sig  Dispense  Refill  . albuterol (PROVENTIL HFA;VENTOLIN HFA) 108 (90 BASE) MCG/ACT inhaler   Inhalation   Inhale 2 puffs into the lungs every 6 (six) hours as needed for wheezing or shortness of breath.         . budesonide-formoterol (SYMBICORT) 160-4.5 MCG/ACT inhaler   Inhalation   Inhale 2 puffs into the lungs 2 (two) times daily.         . citalopram (CELEXA) 40 MG tablet   Oral   Take 40 mg by mouth daily.         . Colesevelam HCl 3.75 G PACK   Oral   Take 1 packet by mouth every evening.         Marland Kitchen  divalproex (DEPAKOTE) 500 MG EC tablet   Oral   Take 1,000 mg by mouth every evening.          . ergocalciferol (VITAMIN D2) 50000 UNITS capsule   Oral   Take 50,000 Units by mouth every Friday.         . fluticasone (FLONASE) 50 MCG/ACT nasal spray   Nasal   Place 2 sprays into the nose 2 (two) times daily.   16 g   11   . furosemide (LASIX) 40 MG tablet   Oral   Take 20 mg by mouth daily. 20 mg daily         . ibuprofen (ADVIL,MOTRIN) 200 MG tablet   Oral   Take 400 mg by mouth every 6 (six) hours as needed for moderate pain.         Marland Kitchen insulin aspart (NOVOLOG FLEXPEN) 100 UNIT/ML injection   Subcutaneous   Inject 5 Units into the skin 3 (three) times daily before meals.          . insulin detemir (LEVEMIR FLEXPEN) 100  UNIT/ML injection   Subcutaneous   Inject 30 Units into the skin daily with breakfast.          . loratadine (CLARITIN) 10 MG tablet   Oral   Take 1 tablet (10 mg total) by mouth daily.   30 tablet   11   . pantoprazole (PROTONIX) 40 MG tablet   Oral   Take 40 mg by mouth 2 (two) times daily.          Marland Kitchen Phenylephrine-Pheniramine-DM (THERAFLU COLD & COUGH) 12-09-18 MG PACK   Oral   Take 1 Package by mouth daily as needed (for cold).         . potassium chloride (KLOR-CON) 10 MEQ CR tablet   Oral   Take 20 mEq by mouth daily before breakfast.          . Pyridoxine HCl (VITAMIN B-6 PO)   Oral   Take 1 tablet by mouth daily.         Marland Kitchen rOPINIRole (REQUIP) 3 MG tablet   Oral   Take 3 mg by mouth 2 (two) times daily.           . traMADol (ULTRAM) 50 MG tablet   Oral   Take 50-100 mg by mouth 2 (two) times daily. Takes 1 tablet in the morning and 2 tablets at night         . cefTRIAXone (ROCEPHIN) 1 G injection   Intramuscular   Inject 1 g into the muscle once.         Marland Kitchen levofloxacin (LEVAQUIN) 500 MG tablet   Oral   Take 1 tablet (500 mg total) by mouth daily.   5 tablet   0    BP 100/54  Pulse 85  Temp(Src) 98.4 F (36.9 C) (Oral)  Resp 15  SpO2 91% Physical Exam  Constitutional: She is oriented to person, place, and time. She appears well-developed and well-nourished. No distress.  HENT:  Head: Normocephalic and atraumatic.  Eyes: EOM are normal. Pupils are equal, round, and reactive to light.  Neck: Normal range of motion. Neck supple.  Cardiovascular: Normal rate and regular rhythm.  Exam reveals no gallop and no friction rub.   No murmur heard. Pulmonary/Chest: Effort normal. She has no wheezes. She has no rales.  Diminished breath sounds  Abdominal: Soft. She exhibits no distension. There is no tenderness.  Musculoskeletal: She exhibits no edema  and no tenderness.  Neurological: She is alert and oriented to person, place, and time.  Skin:  Skin is warm and dry. She is not diaphoretic.  Psychiatric: She has a normal mood and affect. Her behavior is normal.    ED Course  Procedures (including critical care time) Labs Review Labs Reviewed  CBC WITH DIFFERENTIAL - Abnormal; Notable for the following:    Hemoglobin 11.6 (*)    HCT 34.3 (*)    All other components within normal limits  COMPREHENSIVE METABOLIC PANEL - Abnormal; Notable for the following:    Sodium 133 (*)    Chloride 93 (*)    Glucose, Bld 111 (*)    Total Bilirubin 0.2 (*)    GFR calc non Af Amer 72 (*)    GFR calc Af Amer 84 (*)    All other components within normal limits   Imaging Review Dg Chest 2 View  03/09/2013   CLINICAL DATA:  Cough. History of pneumonia and sarcoidosis. Pulmonary fibrosis.  EXAM: CHEST  2 VIEW  COMPARISON:  DG CHEST 2 VIEW dated 10/27/2012; DG CHEST 2 VIEW dated 04/16/2012; CT CHEST W/O CM dated 01/09/2011  FINDINGS: Mild hyperinflation. Midline trachea. Normal heart size and mediastinal contours. No pleural effusion or pneumothorax. Moderate interstitial thickening which is similar to on the prior exam and nonspecific. No lobar consolidation.  IMPRESSION: Similar moderate interstitial thickening, which is nonspecific but could be related to the clinical history of sarcoidosis. No evidence of acute superimposed process.   Electronically Signed   By: Abigail Miyamoto M.D.   On: 03/09/2013 22:42    EKG Interpretation    Date/Time:  Monday March 09 2013 19:41:59 EST Ventricular Rate:  103 PR Interval:  126 QRS Duration: 72 QT Interval:  358 QTC Calculation: 468 R Axis:   78 Text Interpretation:  Sinus tachycardia with Premature atrial complexes ST abnormality, possible digitalis effect Abnormal ECG When compared with ECG of 10/27/2012, Premature atrial complexes are now Present Confirmed by [facility]  MD, DAVID ([id number]) on 03/09/2013 7:57:56 PM            MDM   1. Viral illness     Patient is a 73 year old female with chief  complaint of fever shortness breath cough. Patient went to urgent care earlier today found to have a possible right lower lobe pneumonia. Patient states the symptoms were gone for the past 3 days. Patient was then transferred here after possible pneumonia diagnosis. Patient with a history of gross prolonged and sarcoidosis. Patient patient appears well patient is not hypoxic patient not candidate. Patient on 4 L of home O2 patient setting 99% on 2 L. Patient feels better after Tylenol and small fluid bolus. Patient says she feels comfortable being discharged home.  11:47 PM:  I have discussed the diagnosis/risks/treatment options with the patient and family and believe the pt to be eligible for discharge home to follow-up with PCP. We also discussed returning to the ED immediately if new or worsening sx occur. We discussed the sx which are most concerning (e.g., shortness of breath, change of mental status) that necessitate immediate return. Any new prescriptions provided to the patient are listed below.  New Prescriptions   LEVOFLOXACIN (LEVAQUIN) 500 MG TABLET    Take 1 tablet (500 mg total) by mouth daily.       Deno Etienne, MD 03/09/13 [phone]

## 2013-03-09 NOTE — ED Notes (Signed)
Returned from xray

## 2013-03-09 NOTE — ED Notes (Signed)
Pt. reports persistent productive cough and fever seen at an urgent care today diagnosed with pneumonia ( x-ray done at urgent care ) received Rocephin IM injection prior to arrival.

## 2013-07-09 ENCOUNTER — Ambulatory Visit (INDEPENDENT_AMBULATORY_CARE_PROVIDER_SITE_OTHER): Payer: Medicare Other | Admitting: Emergency Medicine

## 2013-07-09 ENCOUNTER — Encounter: Payer: Self-pay | Admitting: Emergency Medicine

## 2013-07-09 VITALS — BP 160/90 | HR 100 | Ht 65.0 in | Wt 206.0 lb

## 2013-07-09 DIAGNOSIS — J8410 Pulmonary fibrosis, unspecified: Secondary | ICD-10-CM

## 2013-07-09 DIAGNOSIS — I2781 Cor pulmonale (chronic): Secondary | ICD-10-CM

## 2013-07-09 DIAGNOSIS — D869 Sarcoidosis, unspecified: Secondary | ICD-10-CM

## 2013-07-09 DIAGNOSIS — R053 Chronic cough: Secondary | ICD-10-CM

## 2013-07-09 DIAGNOSIS — I2789 Other specified pulmonary heart diseases: Secondary | ICD-10-CM

## 2013-07-09 DIAGNOSIS — R059 Cough, unspecified: Secondary | ICD-10-CM

## 2013-07-09 DIAGNOSIS — I279 Pulmonary heart disease, unspecified: Secondary | ICD-10-CM

## 2013-07-09 DIAGNOSIS — R05 Cough: Secondary | ICD-10-CM

## 2013-07-09 NOTE — Patient Instructions (Signed)
We will perform an echocardiogram and a Ct scan of your chest at [facility] the lasix and levaquin as prescribed We need to insure that your oxygen is working adequately - will ask [facility] to evaluate Follow with Dr Lamonte Sakai in 1 month

## 2013-07-09 NOTE — Assessment & Plan Note (Signed)
-   need to repeat her Ct chest to insure no sarcoid flare contributing to her decompensation

## 2013-07-09 NOTE — Assessment & Plan Note (Signed)
-   she is on levaquin - continue fluticasone and loratadine - continue O2 and replace tank (not functioning).

## 2013-07-09 NOTE — Progress Notes (Signed)
Subjective:    Patient ID: Penny Curry, female    DOB: 1940/12/14    MRN: [id number]  HPI 73 yo woman, former tobacco, recently dx w DM. Referred by Dr Helene Kelp for dyspnea and hx ILD followed by Dr DeCoy in [place], initially received dx of IPF. Underwent FOB and Bx in 2005, treated with pred and cytoxan in the past. Then had nodules on arm that were bx and showed probable sarcoidosis Summer 2011 ([place]). Last CT scan was prob 11/2009 (she isn't sure). 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   She has had an ONO that showed she needs O2 at night. She wears 3L/min at bedtime. She had PSG last summer [facility] - said she does not have OSA but needs O2.   ROV 05/01/10 -- f/u sarcoidosis, assoc ILD and PAH. TTE since last visit PASp ~ 36 mmHg. Saw Dr Helene Kelp about 10 days ago, has had about 5 weeks cough, congestion, low grade fever. Treated 2 rounds abx, most recently avelox by Dr Helene Kelp. No prednisone. Feels like she is improving, but still limited by SOB. No PFT on record, not currently on BDs.   ROV 06/23/10 -- sarcoidosis, assoc ILD, PAH. 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 Pseudo-normalization), decreased diffusion that corrects for Va. Has been feeling fairly well, was seen for abd pain, treated w cipro for ? Diverticulitis or UTI. Breathing has been fair, she believes that the heat and pollen make her worse. Not currently on allergy regimen. Has had some typical sarcoid rash on arms, has resolved (march 12).   ROV 01/05/11 -- 73 yo woman, sarcoidosis, assoc ILD, PAH. PFT 06/2010 = Moderate mixed disease (FEV1 1.63L, 75%). Regular f/u visit. Tells me that she is having more exertional SOB over last 2 months. Very little cough. She does hear some wheeze occasionally w exertion.  She has been having scratchy throat, some difficulty swallowing with choking - has been happening for a long time, but worse last 2 months.  She is on loratadine and omeprazole.    ROV 01/25/11 -- 73 yo woman, sarcoidosis, assoc ILD, PAH. PFT 06/2010 = Moderate mixed disease (FEV1 1.63L, 75%). She returns after having her swallowing eval and her CT chest. Need to get the CT scans from [place] [facility] to compare. She returns with more cough, green mucous, nasal gtt and congestion.   03/22/2011 Acute OV  Returns for persistent cough and congestion . Been seen at PCP and urgent care x 4  over last month at Urgent care for slow to resolve PNA . Currently on Avelox day 8/10. Finished prednisone 3 weeks ago without much help. She has been on 4 different abx including levaquin and avelox .  Still has cough and congestion with thick green mucus. Has some sinus pain and pressure with drainage. No vomitting or edema. No chest pain .  Low grade fevers on/off.  Cough is wearing her out. Take tussionex with some help but does not last. Too expensive.  >>prednisone burst   04/16/2011 Acute OV  Complains of persistent cough and congestion. Worse cough for last 1 week with green mucus, wheezing, increased SOB, tightness in chest, hoarseness, low grade temp x 1 week. Seen 1 month ago for similar symptoms. CXR report from urgent care with no acute process, tx w/ steroid burst, sample of symbicort and Avelox x 7 d . Had minimal improvement and now cough is getting worse. Has  nasal congestion and drainage. Was not able to get hydromet cough syrup due to cough. Cough is aggravating and keeping her up at night. No hemopytsis or chest pain . No n/v/d. No new meds. Currently on prednisone 20mg  daily  >>Levaquin x 10 d , CT sinus +   04/30/2011 Acute OV  Complains of persistent symptoms of cough and congestion . Complains of cough with yellow, sob, wheezing, chest pain, and fever up to 101. States was in the ED last night for  Bronchitis. Has been on several abx for last 2 months . Seen  2 weeks ago with Sarcoid flare /bronchitis and sinusitis . CT sinus showed Paranasal sinus mucosal thickening/opacification  most notable involving the maxillary sinuses (greater on the left) and right ethmoid sinus air cells.she was tx with Levaquin x 10 days and prednisone held at 20mg  daily . However she tapered off steroids -misunderstood directions.  She feels her cough never got better Was seen in ER at [facility] last night, told she had bronchitis and to. follow up in our office. She Was not given any rx. Told xray was neg for PNA. Records are not available.  She does have fever on/off. Tmax 101. Recent labs with no elevated wbc per pt/daughter.  Did have desats in ER to 87%. Wears O2 at sleep only.   ROV 05/04/11 -- 73 yo woman, sarcoidosis, assoc ILD, PAH. PFT 06/2010 = Moderate mixed disease (FEV1 1.63L, 75%). On omeprazole/lansoprazole, loratadine. Has been seen monthly as above for continued nasal congestion and cough - multiple rounds abx and pred with no lasting effect, tapered down to 20mg , now back up to 40mg . She is on Symbicort - restarted ??. Her sinus CT scan shows chronic sinusitis. She has been having low grade fevers. Blows yellow green out of her nose.  rec Clindamycin x 6 weeks, pred taper off  05/23/2011 f/u ov/Wert cc intermittent fever to 101 and mucus dark turned lighter yellow while on clindamycin but stopped p 2 weeks due to rash, no better on prednisone, no worse off it, no worse off symbicort p ran out if it a week prior to OV .  Mod nasal congestion. No using ppi timed to ac. No sinus or tooth pain. Sob not changed from baseline >>GERD tx   05/15/12 post hospital follow up     HFU - reports doing well overall but still weak and has decreased energy.  Patient was recently admitted with pulmonary hypertension, and pulmonary sarcoidosis. She was felt to have decompensated cor pulmonale likely secondary to pulmonary hypertension. She improved with diuresis. She was Recommended on O2 compliance. 2-D echo showed normal ejection fracture 55-60% mild to moderate mitral valve regurgitation. Left atrium  was moderately dilated. Venous Dopplers were negative. Patient denies any hemoptysis, orthopnea, PND, or increased leg swelling.   Does complain of urinary urgency at times. Wants urine checked   ROV 07/09/12 -- sarcoidosis, assoc ILD, PAH. PFT 06/2010 = Moderate mixed disease (FEV1 1.63L, 75%). Many of her visits have been acute OV's for cough, congestion, bronchospasm. TTE 3/14 > intact LV fxn, normal RV size and fxn.  She has had some cough, she is on fluticasone. Omeprazole bid > still some breakthrough GERD sx.   ROV 11/12/12 -- sarcoidosis, assoc ILD, PAH. PFT 06/2010 = Moderate mixed disease (FEV1 1.63L, 75%). Many of her visits have been acute OV's for cough, congestion, bronchospasm. We performed repeat PFT today >> mixed disease with decrease in FEV1 to 1.43L. She does not wear  her O2 with exertion. She c/o fatigue, dyspnea, "just not feeling good". On symbicort, fluticasone nasal spray.   ROV 02/25/13 -- f/u for sarcoidosis, assoc ILD, mixed obstructive and restrictive disease. She has had trouble with cough, prod of thick yellow phlegm and then green phlegm. Was treated with abx in 11/'14 and the phlegm cleared. Continued to have sinus drainage. Then had recurrent bout of bronchitis end of Dec. Just finished a round of levaquin. She still has hoarse voice, cough.  She is using fluticasone, generic benadryl some nights. She is now on pantoprazole (was on omeprazole). Just discovered that her Vit D is low.   ROV 07/09/13 -- hx sarcoidosis, resultant ILD, mixed obstruction and restriction. Also hx allergies and chronic cough.  She is on Symbicort, flonase, loratadine. Was treated with levaquin again beginning . She describes a month of worsening exertional SOB and she has noticed low oxygen levels with exertion. She had some increasing edema, hypertension.  Some occas cough but not much, some low grade fever. Hoarse voice. Lots of nasal congestion on fluticasone and loratadine. She was started on lasix  by her PCP.  She has not been using her O2 w exertion.        Objective:   Physical Exam Filed Vitals:   07/09/13 0956  BP: 160/90  Pulse: 100  Height: 5\' 5"  (1.651 m)  Weight: 206 lb (93.441 kg)  SpO2: 95%   Gen: anxious amb very hoarse wf nad   ENT: No lesions,  mouth clear,  oropharynx clear, no postnasal drip  Neck: No JVD, no TMG, no carotid bruits  Lungs: No use of accessory muscles, no dullness to percussion, Coarse BS w/ no wheezing  Harsh barking cough  Cardiovascular: RRR, heart sounds normal, no murmur or gallops, tr  peripheral edema  Musculoskeletal: No deformities, no cyanosis or clubbing  Neuro: alert, non focal  Skin: Warm, no lesions or rashes   CXR 05/05/12 --  Comparison: 05/05/2012 05/01/2010  Findings: Increased lung markings may represent decreased lung  volumes. Heart and mediastinum are grossly stable. There is no  evidence of focal airspace disease.  IMPRESSION: Prominent lung markings may represent decreased lung  volumes and chronic changes. Difficult to exclude subtle nodular  disease.  No acute chest findings or airspace disease     Assessment & Plan:  Cor pulmonale Appears to have some decompensated PAH and R heart fxn, likely due to her hypoxemia, underlying lung disease, systemic HTN.  - agrees that she needs diuresis - also adequate oxygenation - treat any contribution of sarcoidosis - repeat TTE to assess PAPsb  Chronic cough - she is on levaquin - continue fluticasone and loratadine - continue O2 and replace tank (not functioning).   PULMONARY SARCOIDOSIS - need to repeat her Ct chest to insure no sarcoid flare contributing to her decompensation

## 2013-07-09 NOTE — Assessment & Plan Note (Addendum)
Appears to have some decompensated PAH and R heart fxn, likely due to her hypoxemia, underlying lung disease, systemic HTN.  - agrees that she needs diuresis - also adequate oxygenation - treat any contribution of sarcoidosis - repeat TTE to assess PAPsb

## 2013-07-27 ENCOUNTER — Other Ambulatory Visit (HOSPITAL_COMMUNITY): Payer: Self-pay | Admitting: Radiology

## 2013-07-27 ENCOUNTER — Ambulatory Visit (HOSPITAL_COMMUNITY): Payer: Medicare Other | Attending: Cardiology | Admitting: Radiology

## 2013-07-27 ENCOUNTER — Ambulatory Visit (INDEPENDENT_AMBULATORY_CARE_PROVIDER_SITE_OTHER)
Admission: RE | Admit: 2013-07-27 | Discharge: 2013-07-27 | Disposition: A | Discharge status: Home / Self Care | Payer: Medicare Other | Source: Ambulatory Visit | Attending: Emergency Medicine | Admitting: Emergency Medicine

## 2013-07-27 DIAGNOSIS — I279 Pulmonary heart disease, unspecified: Secondary | ICD-10-CM

## 2013-07-27 DIAGNOSIS — R0989 Other specified symptoms and signs involving the circulatory and respiratory systems: Principal | ICD-10-CM | POA: Insufficient documentation

## 2013-07-27 DIAGNOSIS — I059 Rheumatic mitral valve disease, unspecified: Secondary | ICD-10-CM

## 2013-07-27 DIAGNOSIS — R609 Edema, unspecified: Secondary | ICD-10-CM

## 2013-07-27 DIAGNOSIS — R0609 Other forms of dyspnea: Secondary | ICD-10-CM

## 2013-07-27 DIAGNOSIS — I2789 Other specified pulmonary heart diseases: Secondary | ICD-10-CM

## 2013-07-27 DIAGNOSIS — D869 Sarcoidosis, unspecified: Secondary | ICD-10-CM

## 2013-07-27 NOTE — Progress Notes (Signed)
Echocardiogram performed.  

## 2013-07-29 ENCOUNTER — Telehealth: Payer: Self-pay | Admitting: Emergency Medicine

## 2013-07-29 NOTE — Telephone Encounter (Signed)
Pt aware that we are sending message to RB to advise on results. Pt would like a call back tomorrow with results of ECHO ad CT.  RB Please advise. Thanks.

## 2013-07-30 ENCOUNTER — Telehealth: Payer: Self-pay | Admitting: Emergency Medicine

## 2013-07-30 NOTE — Telephone Encounter (Signed)
Please let pt know that her echocardiogram looks good - strong left and right heart function. Also, her CT scan of the chest shows that her sarcoidosis is stable, no evidence of a flare. Thank

## 2013-07-30 NOTE — Telephone Encounter (Signed)
Advised pt that RB stated left and right side of heart were strong. Pt verbalized understanding and had no further questions

## 2013-07-30 NOTE — Telephone Encounter (Signed)
Advised pt of echo and CT result per RB. Pt verbalized understanding and had no further questions

## 2013-08-05 ENCOUNTER — Encounter: Payer: Self-pay | Admitting: Emergency Medicine

## 2013-08-05 ENCOUNTER — Ambulatory Visit (INDEPENDENT_AMBULATORY_CARE_PROVIDER_SITE_OTHER): Payer: Medicare Other | Admitting: Emergency Medicine

## 2013-08-05 VITALS — BP 140/70 | HR 95 | Ht 65.0 in | Wt 207.0 lb

## 2013-08-05 DIAGNOSIS — D869 Sarcoidosis, unspecified: Secondary | ICD-10-CM

## 2013-08-05 DIAGNOSIS — J45909 Unspecified asthma, uncomplicated: Secondary | ICD-10-CM

## 2013-08-05 DIAGNOSIS — J8410 Pulmonary fibrosis, unspecified: Secondary | ICD-10-CM

## 2013-08-05 NOTE — Assessment & Plan Note (Signed)
Stable by Ct scan. Will defer any new meds at this time

## 2013-08-05 NOTE — Assessment & Plan Note (Signed)
Stable by CT, but clearly a contributor to her dyspnea.

## 2013-08-05 NOTE — Patient Instructions (Signed)
Please continue your medications as you have been taking them It is very important for you to wear oxygen at all times Your  CT scan and your echocardiogram are both stable Follow with Dr Lamonte Sakai in 3 months or sooner if you have any problems.

## 2013-08-05 NOTE — Progress Notes (Signed)
Subjective:    Patient ID: Penny Curry, female    DOB: 23-Mar-1940    MRN: [id number]  HPI 73 yo woman, former tobacco, recently dx w DM. Referred by Dr Helene Kelp for dyspnea and hx ILD followed by Dr DeCoy in [place], initially received dx of IPF. Underwent FOB and Bx in 2005, treated with pred and cytoxan in the past. Then had nodules on arm that were bx and showed probable sarcoidosis Summer 2011 ([place]). Last CT scan was prob 11/2009 (she isn't sure). 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   She has had an ONO that showed she needs O2 at night. She wears 3L/min at bedtime. She had PSG last summer [facility] - said she does not have OSA but needs O2.   ROV 05/01/10 -- f/u sarcoidosis, assoc ILD and PAH. TTE since last visit PASp ~ 36 mmHg. Saw Dr Helene Kelp about 10 days ago, has had about 5 weeks cough, congestion, low grade fever. Treated 2 rounds abx, most recently avelox by Dr Helene Kelp. No prednisone. Feels like she is improving, but still limited by SOB. No PFT on record, not currently on BDs.   ROV 06/23/10 -- sarcoidosis, assoc ILD, PAH. 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 Pseudo-normalization), decreased diffusion that corrects for Va. Has been feeling fairly well, was seen for abd pain, treated w cipro for ? Diverticulitis or UTI. Breathing has been fair, she believes that the heat and pollen make her worse. Not currently on allergy regimen. Has had some typical sarcoid rash on arms, has resolved (march 12).   ROV 01/05/11 -- 73 yo woman, sarcoidosis, assoc ILD, PAH. PFT 06/2010 = Moderate mixed disease (FEV1 1.63L, 75%). Regular f/u visit. Tells me that she is having more exertional SOB over last 2 months. Very little cough. She does hear some wheeze occasionally w exertion.  She has been having scratchy throat, some difficulty swallowing with choking - has been happening for a long time, but worse last 2 months.  She is on loratadine and omeprazole.    ROV 01/25/11 -- 73 yo woman, sarcoidosis, assoc ILD, PAH. PFT 06/2010 = Moderate mixed disease (FEV1 1.63L, 75%). She returns after having her swallowing eval and her CT chest. Need to get the CT scans from [place] [facility] to compare. She returns with more cough, green mucous, nasal gtt and congestion.   03/22/2011 Acute OV  Returns for persistent cough and congestion . Been seen at PCP and urgent care x 4  over last month at Urgent care for slow to resolve PNA . Currently on Avelox day 8/10. Finished prednisone 3 weeks ago without much help. She has been on 4 different abx including levaquin and avelox .  Still has cough and congestion with thick green mucus. Has some sinus pain and pressure with drainage. No vomitting or edema. No chest pain .  Low grade fevers on/off.  Cough is wearing her out. Take tussionex with some help but does not last. Too expensive.  >>prednisone burst   04/16/2011 Acute OV  Complains of persistent cough and congestion. Worse cough for last 1 week with green mucus, wheezing, increased SOB, tightness in chest, hoarseness, low grade temp x 1 week. Seen 1 month ago for similar symptoms. CXR report from urgent care with no acute process, tx w/ steroid burst, sample of symbicort and Avelox x 7 d . Had minimal improvement and now cough is getting worse. Has  nasal congestion and drainage. Was not able to get hydromet cough syrup due to cough. Cough is aggravating and keeping her up at night. No hemopytsis or chest pain . No n/v/d. No new meds. Currently on prednisone 20mg  daily  >>Levaquin x 10 d , CT sinus +   04/30/2011 Acute OV  Complains of persistent symptoms of cough and congestion . Complains of cough with yellow, sob, wheezing, chest pain, and fever up to 101. States was in the ED last night for  Bronchitis. Has been on several abx for last 2 months . Seen  2 weeks ago with Sarcoid flare /bronchitis and sinusitis . CT sinus showed Paranasal sinus mucosal thickening/opacification  most notable involving the maxillary sinuses (greater on the left) and right ethmoid sinus air cells.she was tx with Levaquin x 10 days and prednisone held at 20mg  daily . However she tapered off steroids -misunderstood directions.  She feels her cough never got better Was seen in ER at [facility] last night, told she had bronchitis and to. follow up in our office. She Was not given any rx. Told xray was neg for PNA. Records are not available.  She does have fever on/off. Tmax 101. Recent labs with no elevated wbc per pt/daughter.  Did have desats in ER to 87%. Wears O2 at sleep only.   ROV 05/04/11 -- 73 yo woman, sarcoidosis, assoc ILD, PAH. PFT 06/2010 = Moderate mixed disease (FEV1 1.63L, 75%). On omeprazole/lansoprazole, loratadine. Has been seen monthly as above for continued nasal congestion and cough - multiple rounds abx and pred with no lasting effect, tapered down to 20mg , now back up to 40mg . She is on Symbicort - restarted ??. Her sinus CT scan shows chronic sinusitis. She has been having low grade fevers. Blows yellow green out of her nose.  rec Clindamycin x 6 weeks, pred taper off  05/23/2011 f/u ov/Wert cc intermittent fever to 101 and mucus dark turned lighter yellow while on clindamycin but stopped p 2 weeks due to rash, no better on prednisone, no worse off it, no worse off symbicort p ran out if it a week prior to OV .  Mod nasal congestion. No using ppi timed to ac. No sinus or tooth pain. Sob not changed from baseline >>GERD tx   05/15/12 post hospital follow up     HFU - reports doing well overall but still weak and has decreased energy.  Patient was recently admitted with pulmonary hypertension, and pulmonary sarcoidosis. She was felt to have decompensated cor pulmonale likely secondary to pulmonary hypertension. She improved with diuresis. She was Recommended on O2 compliance. 2-D echo showed normal ejection fracture 55-60% mild to moderate mitral valve regurgitation. Left atrium  was moderately dilated. Venous Dopplers were negative. Patient denies any hemoptysis, orthopnea, PND, or increased leg swelling.   Does complain of urinary urgency at times. Wants urine checked   ROV 07/09/12 -- sarcoidosis, assoc ILD, PAH. PFT 06/2010 = Moderate mixed disease (FEV1 1.63L, 75%). Many of her visits have been acute OV's for cough, congestion, bronchospasm. TTE 3/14 > intact LV fxn, normal RV size and fxn.  She has had some cough, she is on fluticasone. Omeprazole bid > still some breakthrough GERD sx.   ROV 11/12/12 -- sarcoidosis, assoc ILD, PAH. PFT 06/2010 = Moderate mixed disease (FEV1 1.63L, 75%). Many of her visits have been acute OV's for cough, congestion, bronchospasm. We performed repeat PFT today >> mixed disease with decrease in FEV1 to 1.43L. She does not wear  her O2 with exertion. She c/o fatigue, dyspnea, "just not feeling good". On symbicort, fluticasone nasal spray.   ROV 02/25/13 -- f/u for sarcoidosis, assoc ILD, mixed obstructive and restrictive disease. She has had trouble with cough, prod of thick yellow phlegm and then green phlegm. Was treated with abx in 11/'14 and the phlegm cleared. Continued to have sinus drainage. Then had recurrent bout of bronchitis end of Dec. Just finished a round of levaquin. She still has hoarse voice, cough.  She is using fluticasone, generic benadryl some nights. She is now on pantoprazole (was on omeprazole). Just discovered that her Vit D is low.   ROV 07/09/13 -- hx sarcoidosis, resultant ILD, mixed obstruction and restriction. Also hx allergies and chronic cough.  She is on Symbicort, flonase, loratadine. Was treated with levaquin again beginning . She describes a month of worsening exertional SOB and she has noticed low oxygen levels with exertion. She had some increasing edema, hypertension.  Some occas cough but not much, some low grade fever. Hoarse voice. Lots of nasal congestion on fluticasone and loratadine. She was started on lasix  by her PCP.  She has not been using her O2 w exertion.    ROV 08/05/13 -- hx sarcoidosis, resultant ILD, mixed obstruction and restriction; allergies and chronic cough. Last visit she had increased edema and there was concern for decompensation of secondary pulmonary hypertension. We performed echocardiogram and repeated her CT scan of the chest as below. Her RVSP and right ventricular function were normal. Her CT scan of the chest was grossly unchanged compared with 2012. She continues to have some LE edema. She is on symbicort, SABA prn.  Her breathing may be a bit better since she started using O2 more reliably.         Objective:   Physical Exam Filed Vitals:   08/05/13 0946  BP: 140/70  Pulse: 95  Height: 5\' 5"  (1.651 m)  Weight: 207 lb (93.895 kg)  SpO2: 98%   Gen: anxious amb very hoarse wf nad   ENT: No lesions,  mouth clear,  oropharynx clear, no postnasal drip  Neck: No JVD, no TMG, no carotid bruits  Lungs: No use of accessory muscles, no dullness to percussion, Coarse BS w/ no wheezing  Harsh barking cough  Cardiovascular: RRR, heart sounds normal, no murmur or gallops, tr  peripheral edema  Musculoskeletal: No deformities, no cyanosis or clubbing  Neuro: alert, non focal  Skin: Warm, no lesions or rashes  TTE 07/27/13 --  Study Conclusions  - Left ventricle: The cavity size was normal. There was mild concentric hypertrophy. Systolic function was vigorous. The estimated ejection fraction was in the range of 65% to 70%. Wall motion was normal; there were no regional wall motion abnormalities. The study was not technically sufficient to allow evaluation of LV diastolic dysfunction due to atrial fibrillation. - Aortic valve: There was mild stenosis. There was mild regurgitation. Mean gradient (S): 12 mm Hg. Peak gradient (S): 23 mm Hg. Valve area (VTI): 1.9 cm^2. Valve area (Vmax): 1.64 cm^2. - Mitral valve: Calcified annulus. Moderately thickened,  moderately calcified leaflets . The findings are consistent with moderate stenosis. There was mild regurgitation. Mean gradient (D): 6 mm Hg. Peak gradient (D): 17 mm Hg. Valve area by pressure half-time: 2.29 cm^2. Valve area by continuity equation (using LVOT flow): 1.34 cm^2. - Left atrium: The atrium was mildly dilated. - Right ventricle: Systolic function was normal.  Impressions:  - Normal biventricular size and function. Mild  aortic regurgitations and stenosis. Moderate mitral stenosis.     07/27/13 --  COMPARISON: [facility] CTA chest dated 07/10/2013. High-resolution CT  chest dated 01/09/2011.  FINDINGS:  Evaluation of lung parenchyma is constrained by respiratory motion.  Irregular branching/spiculated posterior left upper lobe nodular  opacity measuring 2.1 x 1.7 cm (series 3/image 19), grossly  unchanged from prior study. Additional mild subpleural  reticulation/fibrosis in the lingula (series 3/image 32) and to a  lesser extent in the left lower lobe (series 3/image 39). This  appearance is favored to be related to the patient's history of  sarcoidosis.  No new/suspicious pulmonary nodules. No pleural effusion or  pneumothorax.  Underlying mosaic attenuation, suggesting air trapping.  Visualized thyroid is unremarkable.  The heart is normal in size. No pericardial effusion. Coronary  atherosclerosis. Atherosclerotic calcifications of the aortic arch.  Mildly prominent mediastinal nodes measuring up to 1.2 cm short axis  (series 2/ image 21), likely reactive/related to sarcoidosis.  Visualized upper abdomen is grossly unremarkable.  Mild degenerative changes with thoracic dextroscoliosis.  IMPRESSION:  2.1 cm branching/spiculated nodular opacity in the posterior left  upper lobe, grossly unchanged from 2012, favored to be related to  the patient's history of sarcoidosis.  Associated subpleural reticulation/fibrosis in the lingula and left  lower lobe.  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 Will defer any new meds at this time   INTERSTITIAL LUNG DISEASE Stable by CT, but clearly a contributor to her dyspnea.   Asthma continue symbicort bid, albuterol prn

## 2013-08-05 NOTE — Assessment & Plan Note (Signed)
continue symbicort bid, albuterol prn

## 2013-10-07 ENCOUNTER — Telehealth: Payer: Self-pay | Admitting: *Deleted

## 2013-11-13 ENCOUNTER — Other Ambulatory Visit: Payer: Self-pay | Admitting: *Deleted

## 2013-11-13 DIAGNOSIS — J302 Other seasonal allergic rhinitis: Secondary | ICD-10-CM

## 2013-11-13 MED ORDER — FLUTICASONE PROPIONATE 50 MCG/ACT NA SUSP: 2.0000 | Freq: Every day | NASAL | Status: DC

## 2013-11-26 ENCOUNTER — Ambulatory Visit (INDEPENDENT_AMBULATORY_CARE_PROVIDER_SITE_OTHER): Payer: Medicare Other | Admitting: Adult Health

## 2013-11-26 ENCOUNTER — Encounter: Payer: Self-pay | Admitting: Adult Health

## 2013-11-26 VITALS — BP 154/76 | HR 106 | Temp 98.5°F | Ht 65.0 in | Wt 208.6 lb

## 2013-11-26 DIAGNOSIS — D869 Sarcoidosis, unspecified: Secondary | ICD-10-CM

## 2013-11-26 MED ORDER — PREDNISONE 10 MG PO TABS: ORAL_TABLET | ORAL | Status: DC

## 2013-11-26 MED ORDER — HYDROCODONE-HOMATROPINE 5-1.5 MG/5ML PO SYRP: 5.0000 mL | ORAL_SOLUTION | Freq: Four times a day (QID) | ORAL | Status: DC | PRN

## 2013-11-26 MED ORDER — LEVOFLOXACIN 500 MG PO TABS: 500.0000 mg | ORAL_TABLET | Freq: Every day | ORAL | Status: AC

## 2013-11-26 NOTE — Assessment & Plan Note (Signed)
Flare with bronchitis  Advised on BS on steroids   Plan  Levaquin 500mg  daily for 7 days  Mucinex DM Twice daily  As needed  Cough/congestion  Prednisone taper over next 5 days , call if BS >250 .  Please contact office for sooner follow up if symptoms do not improve or worsen or seek emergency care  Follow up Dr. Lamonte Sakai  In 6 weeks and As needed

## 2013-11-26 NOTE — Patient Instructions (Signed)
Levaquin 500mg  daily for 7 days  Mucinex DM Twice daily  As needed  Cough/congestion  Prednisone taper over next 5 days , call if BS >250 .  Please contact office for sooner follow up if symptoms do not improve or worsen or seek emergency care  Follow up Dr. Lamonte Sakai  In 6 weeks and As needed

## 2013-11-26 NOTE — Progress Notes (Signed)
Subjective:    Patient ID: Penny Curry, female    DOB: 1940/12/20    MRN: [id number]  HPI 73 yo woman, former tobacco, recently dx w DM. Referred by Dr Helene Kelp for dyspnea and hx ILD followed by Dr DeCoy in [place], initially received dx of IPF. Underwent FOB and Bx in 2005, treated with pred and cytoxan in the past. Then had nodules on arm that were bx and showed probable sarcoidosis Summer 2011 ([place]). Last CT scan was prob 11/2009 (she isn't sure). 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   She has had an ONO that showed she needs O2 at night. She wears 3L/min at bedtime. She had PSG last summer [facility] - said she does not have OSA but needs O2.   ROV 05/01/10 -- f/u sarcoidosis, assoc ILD and PAH. TTE since last visit PASp ~ 36 mmHg. Saw Dr Helene Kelp about 10 days ago, has had about 5 weeks cough, congestion, low grade fever. Treated 2 rounds abx, most recently avelox by Dr Helene Kelp. No prednisone. Feels like she is improving, but still limited by SOB. No PFT on record, not currently on BDs.   ROV 06/23/10 -- sarcoidosis, assoc ILD, PAH. 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 Pseudo-normalization), decreased diffusion that corrects for Va. Has been feeling fairly well, was seen for abd pain, treated w cipro for ? Diverticulitis or UTI. Breathing has been fair, she believes that the heat and pollen make her worse. Not currently on allergy regimen. Has had some typical sarcoid rash on arms, has resolved (march 12).   ROV 01/05/11 -- 73 yo woman, sarcoidosis, assoc ILD, PAH. PFT 06/2010 = Moderate mixed disease (FEV1 1.63L, 75%). Regular f/u visit. Tells me that she is having more exertional SOB over last 2 months. Very little cough. She does hear some wheeze occasionally w exertion.  She has been having scratchy throat, some difficulty swallowing with choking - has been happening for a long time, but worse last 2 months.  She is on loratadine and omeprazole.    ROV 01/25/11 -- 73 yo woman, sarcoidosis, assoc ILD, PAH. PFT 06/2010 = Moderate mixed disease (FEV1 1.63L, 75%). She returns after having her swallowing eval and her CT chest. Need to get the CT scans from [place] [facility] to compare. She returns with more cough, green mucous, nasal gtt and congestion.   03/22/2011 Acute OV  Returns for persistent cough and congestion . Been seen at PCP and urgent care x 4  over last month at Urgent care for slow to resolve PNA . Currently on Avelox day 8/10. Finished prednisone 3 weeks ago without much help. She has been on 4 different abx including levaquin and avelox .  Still has cough and congestion with thick green mucus. Has some sinus pain and pressure with drainage. No vomitting or edema. No chest pain .  Low grade fevers on/off.  Cough is wearing her out. Take tussionex with some help but does not last. Too expensive.  >>prednisone burst   04/16/2011 Acute OV  Complains of persistent cough and congestion. Worse cough for last 1 week with green mucus, wheezing, increased SOB, tightness in chest, hoarseness, low grade temp x 1 week. Seen 1 month ago for similar symptoms. CXR report from urgent care with no acute process, tx w/ steroid burst, sample of symbicort and Avelox x 7 d . Had minimal improvement and now cough is getting worse. Has  nasal congestion and drainage. Was not able to get hydromet cough syrup due to cough. Cough is aggravating and keeping her up at night. No hemopytsis or chest pain . No n/v/d. No new meds. Currently on prednisone 20mg  daily  >>Levaquin x 10 d , CT sinus +   04/30/2011 Acute OV  Complains of persistent symptoms of cough and congestion . Complains of cough with yellow, sob, wheezing, chest pain, and fever up to 101. States was in the ED last night for  Bronchitis. Has been on several abx for last 2 months . Seen  2 weeks ago with Sarcoid flare /bronchitis and sinusitis . CT sinus showed Paranasal sinus mucosal thickening/opacification  most notable involving the maxillary sinuses (greater on the left) and right ethmoid sinus air cells.she was tx with Levaquin x 10 days and prednisone held at 20mg  daily . However she tapered off steroids -misunderstood directions.  She feels her cough never got better Was seen in ER at [facility] last night, told she had bronchitis and to. follow up in our office. She Was not given any rx. Told xray was neg for PNA. Records are not available.  She does have fever on/off. Tmax 101. Recent labs with no elevated wbc per pt/daughter.  Did have desats in ER to 87%. Wears O2 at sleep only.   ROV 05/04/11 -- 73 yo woman, sarcoidosis, assoc ILD, PAH. PFT 06/2010 = Moderate mixed disease (FEV1 1.63L, 75%). On omeprazole/lansoprazole, loratadine. Has been seen monthly as above for continued nasal congestion and cough - multiple rounds abx and pred with no lasting effect, tapered down to 20mg , now back up to 40mg . She is on Symbicort - restarted ??. Her sinus CT scan shows chronic sinusitis. She has been having low grade fevers. Blows yellow green out of her nose.  rec Clindamycin x 6 weeks, pred taper off  05/23/2011 f/u ov/Wert cc intermittent fever to 101 and mucus dark turned lighter yellow while on clindamycin but stopped p 2 weeks due to rash, no better on prednisone, no worse off it, no worse off symbicort p ran out if it a week prior to OV .  Mod nasal congestion. No using ppi timed to ac. No sinus or tooth pain. Sob not changed from baseline >>GERD tx   05/15/12 post hospital follow up     HFU - reports doing well overall but still weak and has decreased energy.  Patient was recently admitted with pulmonary hypertension, and pulmonary sarcoidosis. She was felt to have decompensated cor pulmonale likely secondary to pulmonary hypertension. She improved with diuresis. She was Recommended on O2 compliance. 2-D echo showed normal ejection fracture 55-60% mild to moderate mitral valve regurgitation. Left atrium  was moderately dilated. Venous Dopplers were negative. Patient denies any hemoptysis, orthopnea, PND, or increased leg swelling.   Does complain of urinary urgency at times. Wants urine checked   ROV 07/09/12 -- sarcoidosis, assoc ILD, PAH. PFT 06/2010 = Moderate mixed disease (FEV1 1.63L, 75%). Many of her visits have been acute OV's for cough, congestion, bronchospasm. TTE 3/14 > intact LV fxn, normal RV size and fxn.  She has had some cough, she is on fluticasone. Omeprazole bid > still some breakthrough GERD sx.   ROV 11/12/12 -- sarcoidosis, assoc ILD, PAH. PFT 06/2010 = Moderate mixed disease (FEV1 1.63L, 75%). Many of her visits have been acute OV's for cough, congestion, bronchospasm. We performed repeat PFT today >> mixed disease with decrease in FEV1 to 1.43L. She does not wear  her O2 with exertion. She c/o fatigue, dyspnea, "just not feeling good". On symbicort, fluticasone nasal spray.   ROV 02/25/13 -- f/u for sarcoidosis, assoc ILD, mixed obstructive and restrictive disease. She has had trouble with cough, prod of thick yellow phlegm and then green phlegm. Was treated with abx in 11/'14 and the phlegm cleared. Continued to have sinus drainage. Then had recurrent bout of bronchitis end of Dec. Just finished a round of levaquin. She still has hoarse voice, cough.  She is using fluticasone, generic benadryl some nights. She is now on pantoprazole (was on omeprazole). Just discovered that her Vit D is low.   ROV 07/09/13 -- hx sarcoidosis, resultant ILD, mixed obstruction and restriction. Also hx allergies and chronic cough.  She is on Symbicort, flonase, loratadine. Was treated with levaquin again beginning . She describes a month of worsening exertional SOB and she has noticed low oxygen levels with exertion. She had some increasing edema, hypertension.  Some occas cough but not much, some low grade fever. Hoarse voice. Lots of nasal congestion on fluticasone and loratadine. She was started on lasix  by her PCP.  She has not been using her O2 w exertion.    ROV 08/05/13 -- hx sarcoidosis, resultant ILD, mixed obstruction and restriction; allergies and chronic cough. Last visit she had increased edema and there was concern for decompensation of secondary pulmonary hypertension. We performed echocardiogram and repeated her CT scan of the chest as below. Her RVSP and right ventricular function were normal. Her CT scan of the chest was grossly unchanged compared with 2012. She continues to have some LE edema. She is on symbicort, SABA prn.  Her breathing may be a bit better since she started using O2 more reliably.    11/26/2013 Acute OV  hx sarcoidosis, resultant ILD, mixed obstruction and restriction; allergies and chronic cough. Complains of increased SOB, wheezing, prod cough with yellow mucus, head congestion and PND, some occasional chills/sweats x 4 weeks, worse x1 week.   Denies any hemoptysis , chest pain, orthopnea, fever, edema.  No recent abx or steroids  Says BS are well controlled on her Insulin regimen    ROS Constitutional:   No  weight loss, night sweats,  Fevers, chills,  +fatigue, or  lassitude.  HEENT:   No headaches,  Difficulty swallowing,  Tooth/dental problems, or  Sore throat,                No sneezing, itching, ear ache,  +nasal congestion, post nasal drip,   CV:  No chest pain,  Orthopnea, PND, swelling in lower extremities, anasarca, dizziness, palpitations, syncope.   GI  No heartburn, indigestion, abdominal pain, nausea, vomiting, diarrhea, change in bowel habits, loss of appetite, bloody stools.   Resp:    No chest wall deformity  Skin: no rash or lesions.  GU: no dysuria, change in color of urine, no urgency or frequency.  No flank pain, no hematuria   MS:  No joint pain or swelling.  No decreased range of motion.  No back pain.  Psych:  No change in mood or affect. No depression or anxiety.  No memory loss.           Objective:   Physical  Exam Filed Vitals:   11/26/13 1540  BP: 154/76  Pulse: 106  Temp: 98.5 F (36.9 C)  TempSrc: Oral  Height: 5\' 5"  (1.651 m)  Weight: 208 lb 9.6 oz (94.62 kg)  SpO2: 92%   Gen: anxious amb  very hoarse wf nad   ENT: No lesions,  mouth clear,  oropharynx clear, no postnasal drip  Neck: No JVD, no TMG, no carotid bruits  Lungs: No use of accessory muscles, no dullness to percussion, Coarse BS w/ no wheezing   Cardiovascular: RRR, heart sounds normal, no murmur or gallops, tr  peripheral edema  Musculoskeletal: No deformities, no cyanosis or clubbing  Neuro: alert, non focal  Skin: Warm, no lesions or rashes  TTE 07/27/13 --  Study Conclusions  - Left ventricle: The cavity size was normal. There was mild concentric hypertrophy. Systolic function was vigorous. The estimated ejection fraction was in the range of 65% to 70%. Wall motion was normal; there were no regional wall motion abnormalities. The study was not technically sufficient to allow evaluation of LV diastolic dysfunction due to atrial fibrillation. - Aortic valve: There was mild stenosis. There was mild regurgitation. Mean gradient (S): 12 mm Hg. Peak gradient (S): 23 mm Hg. Valve area (VTI): 1.9 cm^2. Valve area (Vmax): 1.64 cm^2. - Mitral valve: Calcified annulus. Moderately thickened, moderately calcified leaflets . The findings are consistent with moderate stenosis. There was mild regurgitation. Mean gradient (D): 6 mm Hg. Peak gradient (D): 17 mm Hg. Valve area by pressure half-time: 2.29 cm^2. Valve area by continuity equation (using LVOT flow): 1.34 cm^2. - Left atrium: The atrium was mildly dilated. - Right ventricle: Systolic function was normal.  Impressions:  - Normal biventricular size and function. Mild aortic regurgitations and stenosis. Moderate mitral stenosis.     07/27/13 --  COMPARISON: [place] CTA chest dated 07/10/2013. High-resolution CT  chest dated 01/09/2011.  FINDINGS:   Evaluation of lung parenchyma is constrained by respiratory motion.  Irregular branching/spiculated posterior left upper lobe nodular  opacity measuring 2.1 x 1.7 cm (series 3/image 19), grossly  unchanged from prior study. Additional mild subpleural  reticulation/fibrosis in the lingula (series 3/image 32) and to a  lesser extent in the left lower lobe (series 3/image 39). This  appearance is favored to be related to the patient's history of  sarcoidosis.  No new/suspicious pulmonary nodules. No pleural effusion or  pneumothorax.  Underlying mosaic attenuation, suggesting air trapping.  Visualized thyroid is unremarkable.  The heart is normal in size. No pericardial effusion. Coronary  atherosclerosis. Atherosclerotic calcifications of the aortic arch.  Mildly prominent mediastinal nodes measuring up to 1.2 cm short axis  (series 2/ image 21), likely reactive/related to sarcoidosis.  Visualized upper abdomen is grossly unremarkable.  Mild degenerative changes with thoracic dextroscoliosis.  IMPRESSION:  2.1 cm branching/spiculated nodular opacity in the posterior left  upper lobe, grossly unchanged from 2012, favored to be related to  the patient's history of sarcoidosis.  Associated subpleural reticulation/fibrosis in the lingula and left  lower lobe.  Mildly prominent mediastinal nodes measuring up to 1.2 cm short  axis, likely reactive/related to sarcoidosis      Assessment & Plan:  No problem-specific assessment & plan notes found for this encounter.

## 2013-12-15 ENCOUNTER — Telehealth: Payer: Self-pay | Admitting: Emergency Medicine

## 2013-12-15 DIAGNOSIS — J9620 Acute and chronic respiratory failure, unspecified whether with hypoxia or hypercapnia: Secondary | ICD-10-CM

## 2013-12-15 NOTE — Telephone Encounter (Signed)
Ok to order 

## 2013-12-15 NOTE — Telephone Encounter (Signed)
Spoke with pt, was told by [facility] that if [facility] places an order for a pulse oximeter that [facility] could provide her with one to monitor her 02 sats at home.  Dr. Lamonte Sakai are you ok with this order?  Thanks!

## 2013-12-15 NOTE — Telephone Encounter (Signed)
Spoke with pt, she is aware that order is being placed.  Nothing further needed.

## 2013-12-22 ENCOUNTER — Telehealth: Payer: Self-pay | Admitting: Emergency Medicine

## 2013-12-22 NOTE — Telephone Encounter (Signed)
According to [facility] message confirmed by Corene Cornea with [facility] order was received. I have another staff message to [facility].  Pt aware we will call with update

## 2013-12-23 NOTE — Telephone Encounter (Signed)
Finley, CMA           We are running this through her insurance now. Typically, this item is not covered by insurance and is a retail item in our store. We will call the pt to advise either way and let he know which store she can go to for purchase.  Thanks    [email or web address] [phone]

## 2013-12-23 NOTE — Telephone Encounter (Signed)
i called made pt aware of below. Nothing further needed

## 2013-12-23 NOTE — Telephone Encounter (Signed)
Pt returning call.Penny Curry ° °

## 2014-01-02 NOTE — Telephone Encounter (Signed)
error 

## 2014-01-07 ENCOUNTER — Ambulatory Visit (INDEPENDENT_AMBULATORY_CARE_PROVIDER_SITE_OTHER): Payer: Medicare Other | Admitting: Emergency Medicine

## 2014-01-07 ENCOUNTER — Encounter: Payer: Self-pay | Admitting: Emergency Medicine

## 2014-01-07 VITALS — BP 144/70 | HR 101 | Ht 65.0 in | Wt 205.0 lb

## 2014-01-07 DIAGNOSIS — D869 Sarcoidosis, unspecified: Secondary | ICD-10-CM

## 2014-01-07 NOTE — Patient Instructions (Signed)
We will temporarily stop Symbicort Start Stiolto 2 puffs daily for the next month.  Follow with Dr Lamonte Sakai in a month to discuss whether the medication has helped you

## 2014-01-07 NOTE — Progress Notes (Signed)
Subjective:    Patient ID: Penny Curry, female    DOB: 1940/11/20    MRN: [id number]  HPI 73 yo woman, former tobacco, recently dx w DM. Referred by Dr Helene Kelp for dyspnea and hx ILD followed by Dr DeCoy in [place], initially received dx of IPF. Underwent FOB and Bx in 2005, treated with pred and cytoxan in the past. Then had nodules on arm that were bx and showed probable sarcoidosis Summer 2011 ([place]). Last CT scan was prob 11/2009 (she isn't sure). 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   She has had an ONO that showed she needs O2 at night. She wears 3L/min at bedtime. She had PSG last summer [facility] - said she does not have OSA but needs O2.   ROV 02/25/13 -- f/u for sarcoidosis, assoc ILD, mixed obstructive and restrictive disease. She has had trouble with cough, prod of thick yellow phlegm and then green phlegm. Was treated with abx in 11/'14 and the phlegm cleared. Continued to have sinus drainage. Then had recurrent bout of bronchitis end of Dec. Just finished a round of levaquin. She still has hoarse voice, cough.  She is using fluticasone, generic benadryl some nights. She is now on pantoprazole (was on omeprazole). Just discovered that her Vit D is low.   ROV 07/09/13 -- hx sarcoidosis, resultant ILD, mixed obstruction and restriction. Also hx allergies and chronic cough.  She is on Symbicort, flonase, loratadine. Was treated with levaquin again beginning . She describes a month of worsening exertional SOB and she has noticed low oxygen levels with exertion. She had some increasing edema, hypertension.  Some occas cough but not much, some low grade fever. Hoarse voice. Lots of nasal congestion on fluticasone and loratadine. She was started on lasix by her PCP.  She has not been using her O2 w exertion.    ROV 08/05/13 -- hx sarcoidosis, resultant ILD, mixed obstruction and restriction; allergies and chronic cough. Last visit she had increased edema and there was concern for  decompensation of secondary pulmonary hypertension. We performed echocardiogram and repeated her CT scan of the chest as below. Her RVSP and right ventricular function were normal. Her CT scan of the chest was grossly unchanged compared with 2012. She continues to have some LE edema. She is on symbicort, SABA prn.  Her breathing may be a bit better since she started using O2 more reliably.    Acute OV  hx sarcoidosis, resultant ILD, mixed obstruction and restriction; allergies and chronic cough. Complains of increased SOB, wheezing, prod cough with yellow mucus, head congestion and PND, some occasional chills/sweats x 4 weeks, worse x1 week.   Denies any hemoptysis , chest pain, orthopnea, fever, edema.  No recent abx or steroids  Says BS are well controlled on her Insulin regimen   ROV 01/07/14 -- follow up for her sarcoidosis, associated ILD, chronic cough.  She was recently treated for an AE with improvement. She is feeling tired all the time, can nap and fall asleep. O2 on 3L/min pulsed. She is on symbicort + SABA prn, uses about 2x a day.         Objective:   Physical Exam Filed Vitals:   01/07/14 1520  BP: 144/70  Pulse: 101  Height: 5\' 5"  (1.651 m)  Weight: 205 lb (92.987 kg)  SpO2: 97%   Gen: anxious amb very hoarse wf nad   ENT: No lesions,  mouth clear,  oropharynx clear, no postnasal drip  Neck: No JVD, no TMG, no carotid bruits  Lungs: No use of accessory muscles, no dullness to percussion, Coarse BS w/ no wheezing   Cardiovascular: RRR, heart sounds normal, no murmur or gallops, tr  peripheral edema  Musculoskeletal: No deformities, no cyanosis or clubbing  Neuro: alert, non focal  Skin: Warm, no lesions or rashes  TTE 07/27/13 --  Study Conclusions  - Left ventricle: The cavity size was normal. There was mild concentric hypertrophy. Systolic function was vigorous. The estimated ejection fraction was in the range of 65% to 70%. Wall motion was normal; there  were no regional wall motion abnormalities. The study was not technically sufficient to allow evaluation of LV diastolic dysfunction due to atrial fibrillation. - Aortic valve: There was mild stenosis. There was mild regurgitation. Mean gradient (S): 12 mm Hg. Peak gradient (S): 23 mm Hg. Valve area (VTI): 1.9 cm^2. Valve area (Vmax): 1.64 cm^2. - Mitral valve: Calcified annulus. Moderately thickened, moderately calcified leaflets . The findings are consistent with moderate stenosis. There was mild regurgitation. Mean gradient (D): 6 mm Hg. Peak gradient (D): 17 mm Hg. Valve area by pressure half-time: 2.29 cm^2. Valve area by continuity equation (using LVOT flow): 1.34 cm^2. - Left atrium: The atrium was mildly dilated. - Right ventricle: Systolic function was normal.  Impressions:  - Normal biventricular size and function. Mild aortic regurgitations and stenosis. Moderate mitral stenosis.   07/27/13 --  COMPARISON: [place] CTA chest dated 07/10/2013. High-resolution CT  chest dated 01/09/2011.  FINDINGS:  Evaluation of lung parenchyma is constrained by respiratory motion.  Irregular branching/spiculated posterior left upper lobe nodular  opacity measuring 2.1 x 1.7 cm (series 3/image 19), grossly  unchanged from prior study. Additional mild subpleural  reticulation/fibrosis in the lingula (series 3/image 32) and to a  lesser extent in the left lower lobe (series 3/image 39). This  appearance is favored to be related to the patient's history of  sarcoidosis.  No new/suspicious pulmonary nodules. No pleural effusion or  pneumothorax.  Underlying mosaic attenuation, suggesting air trapping.  Visualized thyroid is unremarkable.  The heart is normal in size. No pericardial effusion. Coronary  atherosclerosis. Atherosclerotic calcifications of the aortic arch.  Mildly prominent mediastinal nodes measuring up to 1.2 cm short axis  (series 2/ image 21), likely reactive/related to  sarcoidosis.  Visualized upper abdomen is grossly unremarkable.  Mild degenerative changes with thoracic dextroscoliosis.  IMPRESSION:  2.1 cm branching/spiculated nodular opacity in the posterior left  upper lobe, grossly unchanged from 2012, favored to be related to  the patient's history of sarcoidosis.  Associated subpleural reticulation/fibrosis in the lingula and left  lower lobe.  Mildly prominent mediastinal nodes measuring up to 1.2 cm short  axis, likely reactive/related to sarcoidosis      Assessment & Plan:  PULMONARY SARCOIDOSIS We will temporarily stop Symbicort Start Stiolto 2 puffs daily for the next month.  Follow with Dr Lamonte Sakai in a month to discuss whether the medication has helped you

## 2014-01-07 NOTE — Assessment & Plan Note (Signed)
We will temporarily stop Symbicort Start Stiolto 2 puffs daily for the next month.  Follow with Dr Lamonte Sakai in a month to discuss whether the medication has helped you

## 2014-02-04 ENCOUNTER — Encounter: Payer: Self-pay | Admitting: Emergency Medicine

## 2014-02-04 ENCOUNTER — Ambulatory Visit (INDEPENDENT_AMBULATORY_CARE_PROVIDER_SITE_OTHER): Payer: Medicare Other | Admitting: Emergency Medicine

## 2014-02-04 VITALS — BP 148/72 | HR 107 | Ht 65.0 in | Wt 211.0 lb

## 2014-02-04 DIAGNOSIS — D869 Sarcoidosis, unspecified: Secondary | ICD-10-CM

## 2014-02-04 MED ORDER — LEVOFLOXACIN 500 MG PO TABS: 500.0000 mg | ORAL_TABLET | Freq: Every day | ORAL | Status: DC

## 2014-02-04 NOTE — Patient Instructions (Addendum)
Stop Symbicort  Please continue your stiolto 2 puffs daily.  Use your albuterol as needed.  Wear your oxygen as directed.  Take levaquin as directed for 7 days.  Follow with Dr Lamonte Sakai in 4 months or sooner if you have any problems.

## 2014-02-04 NOTE — Assessment & Plan Note (Signed)
With sx consistent with a URI and now bronchitis. Will make official change to stiolto and treat w levaquin   Stop Symbicort  Please continue your stiolto 2 puffs daily.  Use your albuterol as needed.  Wear your oxygen as directed.  Take levaquin as directed for 7 days.  Follow with Dr Lamonte Sakai in 4 months or sooner if you have any problems.

## 2014-02-04 NOTE — Progress Notes (Signed)
Subjective:    Patient ID: Penny Curry, female    DOB: 16-Nov-1940    MRN: [id number]  HPI 73 yo woman, former tobacco, recently dx w DM. Referred by Dr Helene Kelp for dyspnea and hx ILD followed by Dr DeCoy in [place], initially received dx of IPF. Underwent FOB and Bx in 2005, treated with pred and cytoxan in the past. Then had nodules on arm that were bx and showed probable sarcoidosis Summer 2011 ([place]). Last CT scan was prob 11/2009 (she isn't sure). 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   She has had an ONO that showed she needs O2 at night. She wears 3L/min at bedtime. She had PSG last summer [facility] - said she does not have OSA but needs O2.   ROV 02/25/13 -- f/u for sarcoidosis, assoc ILD, mixed obstructive and restrictive disease. She has had trouble with cough, prod of thick yellow phlegm and then green phlegm. Was treated with abx in 11/'14 and the phlegm cleared. Continued to have sinus drainage. Then had recurrent bout of bronchitis end of Dec. Just finished a round of levaquin. She still has hoarse voice, cough.  She is using fluticasone, generic benadryl some nights. She is now on pantoprazole (was on omeprazole). Just discovered that her Vit D is low.   ROV 07/09/13 -- hx sarcoidosis, resultant ILD, mixed obstruction and restriction. Also hx allergies and chronic cough.  She is on Symbicort, flonase, loratadine. Was treated with levaquin again beginning . She describes a month of worsening exertional SOB and she has noticed low oxygen levels with exertion. She had some increasing edema, hypertension.  Some occas cough but not much, some low grade fever. Hoarse voice. Lots of nasal congestion on fluticasone and loratadine. She was started on lasix by her PCP.  She has not been using her O2 w exertion.    ROV 08/05/13 -- hx sarcoidosis, resultant ILD, mixed obstruction and restriction; allergies and chronic cough. Last visit she had increased edema and there was concern for  decompensation of secondary pulmonary hypertension. We performed echocardiogram and repeated her CT scan of the chest as below. Her RVSP and right ventricular function were normal. Her CT scan of the chest was grossly unchanged compared with 2012. She continues to have some LE edema. She is on symbicort, SABA prn.  Her breathing may be a bit better since she started using O2 more reliably.    Acute OV  hx sarcoidosis, resultant ILD, mixed obstruction and restriction; allergies and chronic cough. Complains of increased SOB, wheezing, prod cough with yellow mucus, head congestion and PND, some occasional chills/sweats x 4 weeks, worse x1 week.   Denies any hemoptysis , chest pain, orthopnea, fever, edema.  No recent abx or steroids  Says BS are well controlled on her Insulin regimen   ROV 01/07/14 -- follow up for her sarcoidosis, associated ILD, chronic cough.  She was recently treated for an AE with improvement. She is feeling tired all the time, can nap and fall asleep. O2 on 3L/min pulsed. She is on symbicort + SABA prn, uses about 2x a day.   ROV 02/04/14 -- follow up visit for follow up for her sarcoidosis, associated ILD, chronic cough. Last time we stopped Symbicort and started stiolto > she can't tell very much difference. Her cough has worsened over the last 2 weeks, associated with some yellow mucous. Some nasal congestion. She has had multiple sick contacts. She hasn't tried any OTC meds.  Objective:   Physical Exam Filed Vitals:   02/04/14 1548  BP: 148/72  Pulse: 107  Height: 5\' 5"  (1.651 m)  Weight: 211 lb (95.709 kg)  SpO2: 97%   Gen: anxious amb very hoarse wf nad   ENT: No lesions,  mouth clear,  oropharynx clear, no postnasal drip  Neck: No JVD, no TMG, no carotid bruits  Lungs: No use of accessory muscles, no dullness to percussion, Coarse BS w/ no wheezing   Cardiovascular: RRR, heart sounds normal, no murmur or gallops, tr  peripheral  edema  Musculoskeletal: No deformities, no cyanosis or clubbing  Neuro: alert, non focal  Skin: Warm, no lesions or rashes  TTE 07/27/13 --  Study Conclusions  - Left ventricle: The cavity size was normal. There was mild concentric hypertrophy. Systolic function was vigorous. The estimated ejection fraction was in the range of 65% to 70%. Wall motion was normal; there were no regional wall motion abnormalities. The study was not technically sufficient to allow evaluation of LV diastolic dysfunction due to atrial fibrillation. - Aortic valve: There was mild stenosis. There was mild regurgitation. Mean gradient (S): 12 mm Hg. Peak gradient (S): 23 mm Hg. Valve area (VTI): 1.9 cm^2. Valve area (Vmax): 1.64 cm^2. - Mitral valve: Calcified annulus. Moderately thickened, moderately calcified leaflets . The findings are consistent with moderate stenosis. There was mild regurgitation. Mean gradient (D): 6 mm Hg. Peak gradient (D): 17 mm Hg. Valve area by pressure half-time: 2.29 cm^2. Valve area by continuity equation (using LVOT flow): 1.34 cm^2. - Left atrium: The atrium was mildly dilated. - Right ventricle: Systolic function was normal.  Impressions:  - Normal biventricular size and function. Mild aortic regurgitations and stenosis. Moderate mitral stenosis.   07/27/13 --  COMPARISON: [place] CTA chest dated 07/10/2013. High-resolution CT  chest dated 01/09/2011.  FINDINGS:  Evaluation of lung parenchyma is constrained by respiratory motion.  Irregular branching/spiculated posterior left upper lobe nodular  opacity measuring 2.1 x 1.7 cm (series 3/image 19), grossly  unchanged from prior study. Additional mild subpleural  reticulation/fibrosis in the lingula (series 3/image 32) and to a  lesser extent in the left lower lobe (series 3/image 39). This  appearance is favored to be related to the patient's history of  sarcoidosis.  No new/suspicious pulmonary nodules. No pleural  effusion or  pneumothorax.  Underlying mosaic attenuation, suggesting air trapping.  Visualized thyroid is unremarkable.  The heart is normal in size. No pericardial effusion. Coronary  atherosclerosis. Atherosclerotic calcifications of the aortic arch.  Mildly prominent mediastinal nodes measuring up to 1.2 cm short axis  (series 2/ image 21), likely reactive/related to sarcoidosis.  Visualized upper abdomen is grossly unremarkable.  Mild degenerative changes with thoracic dextroscoliosis.  IMPRESSION:  2.1 cm branching/spiculated nodular opacity in the posterior left  upper lobe, grossly unchanged from 2012, favored to be related to  the patient's history of sarcoidosis.  Associated subpleural reticulation/fibrosis in the lingula and left  lower lobe.  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 Will make official change to stiolto and treat w levaquin   Stop Symbicort  Please continue your stiolto 2 puffs daily.  Use your albuterol as needed.  Wear your oxygen as directed.  Take levaquin as directed for 7 days.  Follow with Dr Lamonte Sakai in 4 months or sooner if  you have any problems.

## 2014-02-05 ENCOUNTER — Other Ambulatory Visit: Payer: Self-pay

## 2014-02-05 DIAGNOSIS — J302 Other seasonal allergic rhinitis: Secondary | ICD-10-CM

## 2014-02-05 MED ORDER — FLUTICASONE PROPIONATE 50 MCG/ACT NA SUSP: 2.0000 | Freq: Every day | NASAL | Status: DC

## 2014-02-05 MED ORDER — LORATADINE 10 MG PO TABS: 10.0000 mg | ORAL_TABLET | Freq: Every day | ORAL | Status: AC

## 2014-02-09 ENCOUNTER — Telehealth: Payer: Self-pay | Admitting: Emergency Medicine

## 2014-02-09 MED ORDER — TIOTROPIUM BROMIDE-OLODATEROL 2.5-2.5 MCG/ACT IN AERS: 2.0000 | INHALATION_SPRAY | Freq: Every day | RESPIRATORY_TRACT | Status: DC

## 2014-02-09 NOTE — Telephone Encounter (Signed)
Pt needs stiolto sent in.  Nothing further needed.

## 2014-02-23 ENCOUNTER — Telehealth: Payer: Self-pay | Admitting: Emergency Medicine

## 2014-02-23 MED ORDER — PREDNISONE 10 MG PO TABS: ORAL_TABLET | ORAL | Status: DC

## 2014-02-23 NOTE — Telephone Encounter (Signed)
Pt was seen on 02/04/14 by RB and was given levquin 500 mg qd x 7 days.  Spoke with pt who reports she finished levquin x 1 1/2 wks ago.  Increased DOE is unchanged.  Cough is only some better and still having a lot mucus - now yellow to green.  Feels chest tightness at times.  Temp currently 100 - 100.5.  Generally "doesn't feel good."  Taking Stiolto daily.  Using albuterol hfa with not much relief and advil prn.  Pt requesting further recs -- unable to come in for OV d/t transportation.  Dr. Lamonte Sakai, please advise.  Thank you.  [facility]  Allergies  Allergen Reactions  . Clindamycin/Lincomycin Swelling and Rash  . Penicillins Swelling and Rash     high fever  . Tape Itching and Rash    Paper tape is ok.

## 2014-02-23 NOTE — Telephone Encounter (Signed)
I believe she needs to be seen soon by either RB or TP. Please give her a pred taper > Take 40mg  daily for 3 days, then 30mg  daily for 3 days, then 20mg  daily for 3 days, then 10mg  daily for 3 days, then stop

## 2014-02-23 NOTE — Telephone Encounter (Signed)
Spoke with the pt and notified of recs per RB  She verbalized understanding  She states that she will have to call back and schedule appt  Rx was sent to [facility]

## 2014-02-27 DIAGNOSIS — I509 Heart failure, unspecified: Secondary | ICD-10-CM | POA: Diagnosis not present

## 2014-02-27 DIAGNOSIS — E780 Pure hypercholesterolemia: Secondary | ICD-10-CM | POA: Diagnosis not present

## 2014-02-27 DIAGNOSIS — R0602 Shortness of breath: Secondary | ICD-10-CM | POA: Diagnosis not present

## 2014-02-27 DIAGNOSIS — D869 Sarcoidosis, unspecified: Secondary | ICD-10-CM | POA: Diagnosis not present

## 2014-02-27 DIAGNOSIS — J441 Chronic obstructive pulmonary disease with (acute) exacerbation: Secondary | ICD-10-CM | POA: Diagnosis not present

## 2014-02-27 DIAGNOSIS — E119 Type 2 diabetes mellitus without complications: Secondary | ICD-10-CM | POA: Diagnosis not present

## 2014-02-27 DIAGNOSIS — Z794 Long term (current) use of insulin: Secondary | ICD-10-CM | POA: Diagnosis not present

## 2014-02-27 DIAGNOSIS — J439 Emphysema, unspecified: Secondary | ICD-10-CM | POA: Diagnosis not present

## 2014-02-27 DIAGNOSIS — R0689 Other abnormalities of breathing: Secondary | ICD-10-CM | POA: Diagnosis not present

## 2014-02-27 DIAGNOSIS — Z792 Long term (current) use of antibiotics: Secondary | ICD-10-CM | POA: Diagnosis not present

## 2014-03-20 DIAGNOSIS — J449 Chronic obstructive pulmonary disease, unspecified: Secondary | ICD-10-CM | POA: Diagnosis not present

## 2014-03-25 DIAGNOSIS — J329 Chronic sinusitis, unspecified: Secondary | ICD-10-CM | POA: Diagnosis not present

## 2014-03-25 DIAGNOSIS — E119 Type 2 diabetes mellitus without complications: Secondary | ICD-10-CM | POA: Diagnosis not present

## 2014-03-25 DIAGNOSIS — G5790 Unspecified mononeuropathy of unspecified lower limb: Secondary | ICD-10-CM | POA: Diagnosis not present

## 2014-03-26 ENCOUNTER — Telehealth: Payer: Self-pay | Admitting: Emergency Medicine

## 2014-03-26 NOTE — Telephone Encounter (Signed)
Spoke with pt, states she went to get her handicap placard but the form had the wrong patient's name on it.  Pt is requesting we mail her a correct form to her.  I verified her address on file.  I advised pt that RB would not be in clinic to sign this until Monday, but we would mail it as soon as we have his signature on it.  I've filled this out and placed it in RB's look at.  Will forward to [place] to follow up on.

## 2014-03-30 NOTE — Telephone Encounter (Signed)
Placard has been signed by RB. Pt is aware that I will place this in the mail to her today.

## 2014-04-19 DIAGNOSIS — J449 Chronic obstructive pulmonary disease, unspecified: Secondary | ICD-10-CM | POA: Diagnosis not present

## 2014-05-13 DIAGNOSIS — K219 Gastro-esophageal reflux disease without esophagitis: Secondary | ICD-10-CM | POA: Diagnosis not present

## 2014-05-17 ENCOUNTER — Telehealth: Payer: Self-pay | Admitting: Emergency Medicine

## 2014-05-17 NOTE — Telephone Encounter (Signed)
Called spoke with pt. She reports she is having a colonoscpy and endoscopy done 4/29 in [place]. She reports she is needing surgical clearance. She wants to know if she needs OV or if RB can clear her now. She was last seen 02/04/14 by RB. Please advise thanks

## 2014-05-18 ENCOUNTER — Other Ambulatory Visit: Payer: Self-pay | Admitting: *Deleted

## 2014-05-18 DIAGNOSIS — J302 Other seasonal allergic rhinitis: Secondary | ICD-10-CM

## 2014-05-18 MED ORDER — FLUTICASONE PROPIONATE 50 MCG/ACT NA SUSP: 2.0000 | Freq: Every day | NASAL | Status: DC

## 2014-05-18 MED ORDER — ALBUTEROL SULFATE HFA 108 (90 BASE) MCG/ACT IN AERS: 2.0000 | INHALATION_SPRAY | Freq: Four times a day (QID) | RESPIRATORY_TRACT | Status: DC | PRN

## 2014-05-18 MED ORDER — TIOTROPIUM BROMIDE-OLODATEROL 2.5-2.5 MCG/ACT IN AERS: 2.0000 | INHALATION_SPRAY | Freq: Every day | RESPIRATORY_TRACT | Status: DC

## 2014-05-19 DIAGNOSIS — J449 Chronic obstructive pulmonary disease, unspecified: Secondary | ICD-10-CM | POA: Diagnosis not present

## 2014-05-20 ENCOUNTER — Telehealth: Payer: Self-pay

## 2014-05-20 NOTE — Telephone Encounter (Signed)
05/20/14 Disc Received from [facility] and filed on shelf.Penny Curry

## 2014-05-26 NOTE — Telephone Encounter (Signed)
RB please advise. Thanks.  

## 2014-06-01 NOTE — Telephone Encounter (Signed)
Dr. Byrum - please advise. Thanks. 

## 2014-06-01 NOTE — Telephone Encounter (Signed)
Based on our last visit I believe we can clear her for these procedures. IF she is having any new sx then we need to see her first. Thanks.

## 2014-06-02 NOTE — Telephone Encounter (Signed)
Spoke with pt, she is aware of RB's recs.  Nothing further needed.

## 2014-06-18 DIAGNOSIS — I10 Essential (primary) hypertension: Secondary | ICD-10-CM | POA: Diagnosis not present

## 2014-06-18 DIAGNOSIS — K2970 Gastritis, unspecified, without bleeding: Secondary | ICD-10-CM | POA: Diagnosis not present

## 2014-06-18 DIAGNOSIS — K219 Gastro-esophageal reflux disease without esophagitis: Secondary | ICD-10-CM | POA: Diagnosis not present

## 2014-06-18 DIAGNOSIS — Z1211 Encounter for screening for malignant neoplasm of colon: Secondary | ICD-10-CM | POA: Diagnosis not present

## 2014-06-18 DIAGNOSIS — K648 Other hemorrhoids: Secondary | ICD-10-CM | POA: Diagnosis not present

## 2014-06-18 DIAGNOSIS — D122 Benign neoplasm of ascending colon: Secondary | ICD-10-CM | POA: Diagnosis not present

## 2014-06-18 DIAGNOSIS — F329 Major depressive disorder, single episode, unspecified: Secondary | ICD-10-CM | POA: Diagnosis not present

## 2014-06-18 DIAGNOSIS — J449 Chronic obstructive pulmonary disease, unspecified: Secondary | ICD-10-CM | POA: Diagnosis not present

## 2014-06-18 DIAGNOSIS — E119 Type 2 diabetes mellitus without complications: Secondary | ICD-10-CM | POA: Diagnosis not present

## 2014-06-18 DIAGNOSIS — R1310 Dysphagia, unspecified: Secondary | ICD-10-CM | POA: Diagnosis not present

## 2014-06-19 DIAGNOSIS — J449 Chronic obstructive pulmonary disease, unspecified: Secondary | ICD-10-CM | POA: Diagnosis not present

## 2014-07-04 ENCOUNTER — Ambulatory Visit (INDEPENDENT_AMBULATORY_CARE_PROVIDER_SITE_OTHER): Payer: Medicare Other

## 2014-07-04 ENCOUNTER — Other Ambulatory Visit: Payer: Self-pay | Admitting: Emergency Medicine

## 2014-07-04 ENCOUNTER — Ambulatory Visit (INDEPENDENT_AMBULATORY_CARE_PROVIDER_SITE_OTHER): Payer: Medicare Other | Admitting: Emergency Medicine

## 2014-07-04 VITALS — BP 137/74 | HR 112 | Temp 99.3°F | Resp 16 | Ht 65.0 in | Wt 207.1 lb

## 2014-07-04 DIAGNOSIS — R0600 Dyspnea, unspecified: Secondary | ICD-10-CM | POA: Diagnosis not present

## 2014-07-04 DIAGNOSIS — D649 Anemia, unspecified: Secondary | ICD-10-CM | POA: Diagnosis not present

## 2014-07-04 DIAGNOSIS — R000 Tachycardia, unspecified: Secondary | ICD-10-CM

## 2014-07-04 DIAGNOSIS — R609 Edema, unspecified: Secondary | ICD-10-CM | POA: Diagnosis not present

## 2014-07-04 DIAGNOSIS — D860 Sarcoidosis of lung: Secondary | ICD-10-CM

## 2014-07-04 DIAGNOSIS — E119 Type 2 diabetes mellitus without complications: Secondary | ICD-10-CM | POA: Diagnosis not present

## 2014-07-04 LAB — RETICULOCYTES
ABS RETIC: 86.1 10*3/uL (ref 19.0–186.0)
RBC.: 3.31 MIL/uL — AB (ref 3.87–5.11)
RETIC CT PCT: 2.6 % — AB (ref 0.4–2.3)

## 2014-07-04 LAB — IBC PANEL
%SAT: 10 % — AB (ref 20–55)
TIBC: 507 ug/dL — ABNORMAL HIGH (ref 250–470)
UIBC: 454 ug/dL — ABNORMAL HIGH (ref 125–400)

## 2014-07-04 LAB — POCT CBC
Granulocyte percent: 68.6 %G (ref 37–80)
HEMATOCRIT: 28.8 % — AB (ref 37.7–47.9)
HEMOGLOBIN: 9 g/dL — AB (ref 12.2–16.2)
LYMPH, POC: 1.9 (ref 0.6–3.4)
MCH, POC: 26.4 pg — AB (ref 27–31.2)
MCHC: 31.3 g/dL — AB (ref 31.8–35.4)
MCV: 84.5 fL (ref 80–97)
MID (CBC): 0.4 (ref 0–0.9)
MPV: 6.7 fL (ref 0–99.8)
PLATELET COUNT, POC: 305 10*3/uL (ref 142–424)
POC Granulocyte: 5 (ref 2–6.9)
POC LYMPH PERCENT: 25.4 %L (ref 10–50)
POC MID %: 6 %M (ref 0–12)
RBC: 3.41 M/uL — AB (ref 4.04–5.48)
RDW, POC: 16.7 %
WBC: 7.3 10*3/uL (ref 4.6–10.2)

## 2014-07-04 LAB — BASIC METABOLIC PANEL
BUN: 14 mg/dL (ref 6–23)
CALCIUM: 9.6 mg/dL (ref 8.4–10.5)
CO2: 29 mEq/L (ref 19–32)
Chloride: 95 mEq/L — ABNORMAL LOW (ref 96–112)
Creat: 0.73 mg/dL (ref 0.50–1.10)
GLUCOSE: 174 mg/dL — AB (ref 70–99)
Potassium: 4 mEq/L (ref 3.5–5.3)
Sodium: 134 mEq/L — ABNORMAL LOW (ref 135–145)

## 2014-07-04 LAB — FERRITIN: FERRITIN: 57 ng/mL (ref 10–291)

## 2014-07-04 LAB — IRON: Iron: 53 ug/dL (ref 42–145)

## 2014-07-04 MED ORDER — PREDNISONE 10 MG PO TABS: ORAL_TABLET | ORAL | Status: DC

## 2014-07-04 NOTE — Progress Notes (Addendum)
Subjective:  This chart was scribed for Penny Queen, MD by Moises Blood, Medical Scribe. This patient was seen in Room 6 and the patient's care was started 2:10 PM.    Patient ID: Penny Curry, female    DOB: December 23, 1940, 74 y.o.   MRN: [id number]  HPI Penny Curry is a 74 y.o. female who has pulmonary hypertension and pulmonary sarcoidosis presents to [facility] complaining of gradual onset shortness of breath that started yesterday. Pt states also having feet swelling. She is on oxygen at home and uses breathing treatments once a day as needed and is also on Stiolto. She is currently not on prednisone, and not on medication for pulmonary tension. She denies history of blood clots.   She usually sees Dr. Lamonte Sakai for pulmonary but hasn't seen him in a while.     Review of Systems  Respiratory: Positive for shortness of breath.        Objective:   Physical Exam CONSTITUTIONAL: Well developed/Wel nourished; alert and on oxygen  HEAD: Normocephalic/atraumatic EYES: EOMI/PERRL ENMT: Mucous membranes moist NECK: supple no meningeal signs SPINE/BACK: entire spine nontender CV: S1/S2 noted, no murmurs/rubs/gallops noted;  LUNGS: Lungs are clear to auscultation bilaterally, no apparent distress; dried crackles both lungs ABDOMEN: soft, non tender, no rebound or guarding, bowel sounds noted throughout abdomen GU: no cva tenderness NEURO: Pt is awake/alert/appropriate, moves all extremities x4. No facial droop. EXTREMITIES: pulses normal/equal, full ROM; extremities 1+ edema both ways SKIN: warm, color normal PSYCH: no abnormalities of mood noted, alert, and oriented to situation EKG normal sinus rhythm short PR interval nonspecific ST-T changes Results for orders placed or performed in visit on 07/04/14  POCT CBC  Result Value Ref Range   WBC 7.3 4.6 - 10.2 K/uL   Lymph, poc 1.9 0.6 - 3.4   POC LYMPH PERCENT 25.4 10 - 50 %L   MID (cbc) 0.4 0 - 0.9   POC MID % 6.0 0 - 12 %M   POC  Granulocyte 5.0 2 - 6.9   Granulocyte percent 68.6 37 - 80 %G   RBC 3.41 (A) 4.04 - 5.48 M/uL   Hemoglobin 9.0 (A) 12.2 - 16.2 g/dL   HCT, POC 28.8 (A) 37.7 - 47.9 %   MCV 84.5 80 - 97 fL   MCH, POC 26.4 (A) 27 - 31.2 pg   MCHC 31.3 (A) 31.8 - 35.4 g/dL   RDW, POC 16.7 %   Platelet Count, POC 305 142 - 424 K/uL   MPV 6.7 0 - 99.8 fL  [facility] reading (PRIMARY) by  Dr. Everlene Farrier there are increased markings in a reticular pattern. Increased hilar nodes are present. These changes are consistent with her diagnosis of sarcoid.      Assessment & Plan:  1. Pulmonary sarcoidosis  - POCT CBC - Basic metabolic panel - DG Chest 2 View; Future - predniSONE (DELTASONE) 10 MG tablet; Take 4 day for 3 days 3 a day for 3 days 2 a day for 3 days one a day for 3 days  Dispense: 30 tablet; Refill: 0. I suspect her anemia is contributing to her dyspnea on exertion. She is going to follow-up with Dr. Lamonte Sakai and will follow-up regarding her anemia.  2. Dyspnea  - POCT CBC - Basic metabolic panel - DG Chest 2 View; Future - EKG 12-Lead  3. Edema  - POCT CBC - Basic metabolic panel - DG Chest 2 View; Future  4. Tachycardia EKG did not reveal any acute  problems. She has a sinus tachycardia with occasional PACs and nonspecific ST-T changes - EKG 12-Lead  5. Anemia, unspecified anemia type Patient had endoscopy and colonoscopy 2 weeks ago. - Ferritin - IBC panel - Iron - Reticulocytes   I personally performed the services described in this documentation, which was scribed in my presence. The recorded information has been reviewed and is accurate.  Penny Queen, MD  [facility]  07/04/2014 3:49 PM

## 2014-07-05 ENCOUNTER — Ambulatory Visit (INDEPENDENT_AMBULATORY_CARE_PROVIDER_SITE_OTHER): Payer: Medicare Other | Admitting: Adult Health

## 2014-07-05 ENCOUNTER — Encounter: Payer: Self-pay | Admitting: Adult Health

## 2014-07-05 ENCOUNTER — Telehealth: Payer: Self-pay | Admitting: Emergency Medicine

## 2014-07-05 VITALS — BP 136/72 | HR 112 | Temp 97.6°F | Ht 65.0 in | Wt 205.0 lb

## 2014-07-05 DIAGNOSIS — D869 Sarcoidosis, unspecified: Secondary | ICD-10-CM | POA: Diagnosis not present

## 2014-07-05 DIAGNOSIS — D649 Anemia, unspecified: Secondary | ICD-10-CM | POA: Diagnosis not present

## 2014-07-05 LAB — HEMOGLOBIN A1C
Hgb A1c MFr Bld: 7.8 % — ABNORMAL HIGH (ref <5.7)
Mean Plasma Glucose: 177 mg/dL — ABNORMAL HIGH (ref <117)

## 2014-07-05 NOTE — Patient Instructions (Addendum)
Finish Prednisone taper  Continue on 3l/m of oxygen .  follow up Dr. Lamonte Sakai  In 4 weeks .  Please contact office for sooner follow up if symptoms do not improve or worsen or seek emergency care  Follow up this week with family doctor regarding your anemia .

## 2014-07-05 NOTE — Telephone Encounter (Signed)
Pt has been scheduled to see TP today 2:45pm. Nothing further was needed.

## 2014-07-05 NOTE — Progress Notes (Signed)
Subjective:    Patient ID: Penny Curry, female    DOB: 03-27-1940    MRN: [id number]  HPI 74 yo woman, former tobacco, recently dx w DM. Referred by Dr Helene Kelp for dyspnea and hx ILD followed by Dr DeCoy in [place], initially received dx of IPF. Underwent FOB and Bx in 2005, treated with pred and cytoxan in the past. Then had nodules on arm that were bx and showed probable sarcoidosis Summer 2011 ([place]). Last CT scan was prob 11/2009 (she isn't sure). 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   She has had an ONO that showed she needs O2 at night. She wears 3L/min at bedtime. She had PSG last summer [facility] - said she does not have OSA but needs O2.   ROV 02/25/13 -- f/u for sarcoidosis, assoc ILD, mixed obstructive and restrictive disease. She has had trouble with cough, prod of thick yellow phlegm and then green phlegm. Was treated with abx in 11/'14 and the phlegm cleared. Continued to have sinus drainage. Then had recurrent bout of bronchitis end of Dec. Just finished a round of levaquin. She still has hoarse voice, cough.  She is using fluticasone, generic benadryl some nights. She is now on pantoprazole (was on omeprazole). Just discovered that her Vit D is low.   ROV 07/09/13 -- hx sarcoidosis, resultant ILD, mixed obstruction and restriction. Also hx allergies and chronic cough.  She is on Symbicort, flonase, loratadine. Was treated with levaquin again beginning . She describes a month of worsening exertional SOB and she has noticed low oxygen levels with exertion. She had some increasing edema, hypertension.  Some occas cough but not much, some low grade fever. Hoarse voice. Lots of nasal congestion on fluticasone and loratadine. She was started on lasix by her PCP.  She has not been using her O2 w exertion.    ROV 08/05/13 -- hx sarcoidosis, resultant ILD, mixed obstruction and restriction; allergies and chronic cough. Last visit she had increased edema and there was concern for  decompensation of secondary pulmonary hypertension. We performed echocardiogram and repeated her CT scan of the chest as below. Her RVSP and right ventricular function were normal. Her CT scan of the chest was grossly unchanged compared with 2012. She continues to have some LE edema. She is on symbicort, SABA prn.  Her breathing may be a bit better since she started using O2 more reliably.    Acute OV  hx sarcoidosis, resultant ILD, mixed obstruction and restriction; allergies and chronic cough. Complains of increased SOB, wheezing, prod cough with yellow mucus, head congestion and PND, some occasional chills/sweats x 4 weeks, worse x1 week.   Denies any hemoptysis , chest pain, orthopnea, fever, edema.  No recent abx or steroids  Says BS are well controlled on her Insulin regimen   ROV 01/07/14 -- follow up for her sarcoidosis, associated ILD, chronic cough.  She was recently treated for an AE with improvement. She is feeling tired all the time, can nap and fall asleep. O2 on 3L/min pulsed. She is on symbicort + SABA prn, uses about 2x a day.   ROV 02/04/14 -- follow up visit for follow up for her sarcoidosis, associated ILD, chronic cough. Last time we stopped Symbicort and started stiolto > she can't tell very much difference. Her cough has worsened over the last 2 weeks, associated with some yellow mucous. Some nasal congestion. She has had multiple sick contacts. She hasn't tried any OTC meds.  07/05/2014 Acute OV  Pt returns for follow up for sarcoid flare.  Seen at PCP yesterday , started on pred taper.  Says she has been weak with progressive DOE for last few months . Worse for last couple of weeks.  Labs were done shown  she had progressive anemia.  Says she has been undergoing anemia workup with recent endo and colonoscopy. Hbg tr down from 11.6  to 9 . CXR showed chronic changes.  Denies NSAID use.  Remains on Stiolto . On 3l/m of O2 , sats good at 96%.  She denies any fever,  productive cough, discolored mucus, orthopnea, PND, or increased leg swelling. Has not noticed much difference in her breathing since yesterday.  denies rash.     Ros  See  hpi   Objective:   Physical Exam  Gen: anxious amb wf nad   ENT: No lesions,  mouth clear,  oropharynx clear, no postnasal drip  Neck: No JVD, no TMG, no carotid bruits  Lungs: No use of accessory muscles, no dullness to percussion, Coarse BS w/ no wheezing   Cardiovascular: RRR, heart sounds normal, no murmur or gallops, tr  peripheral edema  Musculoskeletal: No deformities, no cyanosis or clubbing  Neuro: alert, non focal  Skin: Warm, no lesions or rashes  TTE 07/27/13 --  Study Conclusions  - Left ventricle: The cavity size was normal. There was mild concentric hypertrophy. Systolic function was vigorous. The estimated ejection fraction was in the range of 65% to 70%. Wall motion was normal; there were no regional wall motion abnormalities. The study was not technically sufficient to allow evaluation of LV diastolic dysfunction due to atrial fibrillation. - Aortic valve: There was mild stenosis. There was mild regurgitation. Mean gradient (S): 12 mm Hg. Peak gradient (S): 23 mm Hg. Valve area (VTI): 1.9 cm^2. Valve area (Vmax): 1.64 cm^2. - Mitral valve: Calcified annulus. Moderately thickened, moderately calcified leaflets . The findings are consistent with moderate stenosis. There was mild regurgitation. Mean gradient (D): 6 mm Hg. Peak gradient (D): 17 mm Hg. Valve area by pressure half-time: 2.29 cm^2. Valve area by continuity equation (using LVOT flow): 1.34 cm^2. - Left atrium: The atrium was mildly dilated. - Right ventricle: Systolic function was normal.  Impressions:  - Normal biventricular size and function. Mild aortic regurgitations and stenosis. Moderate mitral stenosis.   07/27/13 --  COMPARISON: [place] CTA chest dated 07/10/2013. High-resolution CT  chest dated 01/09/2011.   FINDINGS:  Evaluation of lung parenchyma is constrained by respiratory motion.  Irregular branching/spiculated posterior left upper lobe nodular  opacity measuring 2.1 x 1.7 cm (series 3/image 19), grossly  unchanged from prior study. Additional mild subpleural  reticulation/fibrosis in the lingula (series 3/image 32) and to a  lesser extent in the left lower lobe (series 3/image 39). This  appearance is favored to be related to the patient's history of  sarcoidosis.  No new/suspicious pulmonary nodules. No pleural effusion or  pneumothorax.  Underlying mosaic attenuation, suggesting air trapping.  Visualized thyroid is unremarkable.  The heart is normal in size. No pericardial effusion. Coronary  atherosclerosis. Atherosclerotic calcifications of the aortic arch.  Mildly prominent mediastinal nodes measuring up to 1.2 cm short axis  (series 2/ image 21), likely reactive/related to sarcoidosis.  Visualized upper abdomen is grossly unremarkable.  Mild degenerative changes with thoracic dextroscoliosis.  IMPRESSION:  2.1 cm branching/spiculated nodular opacity in the posterior left  upper lobe, grossly unchanged from 2012, favored to be related to  the patient's  history of sarcoidosis.  Associated subpleural reticulation/fibrosis in the lingula and left  lower lobe.  Mildly prominent mediastinal nodes measuring up to 1.2 cm short  axis, likely reactive/related to sarcoidosis  07/05/14 CXR   Chronic interstitial lung disease. No acute abnormalities.     Assessment & Plan:

## 2014-07-06 ENCOUNTER — Encounter: Payer: Self-pay | Admitting: Family Medicine

## 2014-07-07 DIAGNOSIS — D649 Anemia, unspecified: Secondary | ICD-10-CM | POA: Insufficient documentation

## 2014-07-07 NOTE — Assessment & Plan Note (Signed)
?  mild flare   Plan  Finish Prednisone taper  Continue on 3l/m of oxygen .  follow up Dr. Lamonte Sakai  In 4 weeks .  Please contact office for sooner follow up if symptoms do not improve or worsen or seek emergency care  Follow up this week with family doctor regarding your anemia .

## 2014-07-07 NOTE — Assessment & Plan Note (Signed)
Follow up this week with family doctor regarding your anemia .

## 2014-07-08 DIAGNOSIS — Z1389 Encounter for screening for other disorder: Secondary | ICD-10-CM | POA: Diagnosis not present

## 2014-07-08 DIAGNOSIS — D649 Anemia, unspecified: Secondary | ICD-10-CM | POA: Diagnosis not present

## 2014-07-08 DIAGNOSIS — Z0000 Encounter for general adult medical examination without abnormal findings: Secondary | ICD-10-CM | POA: Diagnosis not present

## 2014-07-08 DIAGNOSIS — Z9181 History of falling: Secondary | ICD-10-CM | POA: Diagnosis not present

## 2014-07-08 DIAGNOSIS — E119 Type 2 diabetes mellitus without complications: Secondary | ICD-10-CM | POA: Diagnosis not present

## 2014-07-09 DIAGNOSIS — D649 Anemia, unspecified: Secondary | ICD-10-CM | POA: Diagnosis not present

## 2014-07-09 DIAGNOSIS — M25559 Pain in unspecified hip: Secondary | ICD-10-CM | POA: Diagnosis not present

## 2014-07-09 DIAGNOSIS — R079 Chest pain, unspecified: Secondary | ICD-10-CM | POA: Diagnosis not present

## 2014-07-09 DIAGNOSIS — D500 Iron deficiency anemia secondary to blood loss (chronic): Secondary | ICD-10-CM | POA: Diagnosis not present

## 2014-07-19 DIAGNOSIS — J449 Chronic obstructive pulmonary disease, unspecified: Secondary | ICD-10-CM | POA: Diagnosis not present

## 2014-07-22 DIAGNOSIS — D509 Iron deficiency anemia, unspecified: Secondary | ICD-10-CM | POA: Diagnosis not present

## 2014-07-22 DIAGNOSIS — J189 Pneumonia, unspecified organism: Secondary | ICD-10-CM | POA: Diagnosis not present

## 2014-08-06 ENCOUNTER — Ambulatory Visit (INDEPENDENT_AMBULATORY_CARE_PROVIDER_SITE_OTHER): Payer: Medicare Other | Admitting: Emergency Medicine

## 2014-08-06 ENCOUNTER — Encounter: Payer: Self-pay | Admitting: Emergency Medicine

## 2014-08-06 VITALS — BP 150/82 | HR 125 | Wt 201.0 lb

## 2014-08-06 DIAGNOSIS — J8410 Pulmonary fibrosis, unspecified: Secondary | ICD-10-CM

## 2014-08-06 DIAGNOSIS — D649 Anemia, unspecified: Secondary | ICD-10-CM

## 2014-08-06 DIAGNOSIS — D869 Sarcoidosis, unspecified: Secondary | ICD-10-CM | POA: Diagnosis not present

## 2014-08-06 DIAGNOSIS — R05 Cough: Secondary | ICD-10-CM

## 2014-08-06 DIAGNOSIS — E119 Type 2 diabetes mellitus without complications: Secondary | ICD-10-CM

## 2014-08-06 DIAGNOSIS — R053 Chronic cough: Secondary | ICD-10-CM

## 2014-08-06 MED ORDER — PREDNISONE 10 MG PO TABS: 30.0000 mg | ORAL_TABLET | Freq: Every day | ORAL | Status: DC

## 2014-08-06 NOTE — Assessment & Plan Note (Signed)
Start prednisone 30 mg daily for the next month and then reassess. She has a stable left upper lobe opacity on serial CT scans of the chest. I do not believe we need to recheck a CT at this time. Her last one was in June/2015. Depending on our progress after starting daily prednisone we may decide to reimage.

## 2014-08-06 NOTE — Assessment & Plan Note (Signed)
Currently under  Evaluation for anemia. She has had a reassuring endoscopy and colonoscopy. She was just started on iron supplements. Certainly anemia could be a contributor to her exertional dyspnea.

## 2014-08-06 NOTE — Patient Instructions (Signed)
Walking oximetry today on 3L/min  Please continue Stiolto daily for now We will start prednisone 30mg  daily until your next visit. Please keep track of your weight and your sugar measurements while on the prednisone.  Follow with Dr Lamonte Sakai in 1 month

## 2014-08-06 NOTE — Assessment & Plan Note (Signed)
Continue max dose treatment for GERD and for allergic rhinitis

## 2014-08-06 NOTE — Assessment & Plan Note (Signed)
Cautioned the patient that she will need to follow her sugars and her weight closely with the initiation of daily prednisone. She agreed to do this and call us if there are any significant changes

## 2014-08-06 NOTE — Progress Notes (Signed)
Subjective:    Patient ID: Penny Curry, female    DOB: Nov 26, 1940    MRN: [id number]  HPI 74 yo woman, former tobacco, recently dx w DM. Referred by Dr Helene Kelp for dyspnea and hx ILD followed by Dr DeCoy in [place], initially received dx of IPF. Underwent FOB and Bx in 2005, treated with pred and cytoxan in the past. Then had nodules on arm that were bx and showed probable sarcoidosis Summer 2011 ([place]). Last CT scan was prob 11/2009 (she isn't sure). 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   She has had an ONO that showed she needs O2 at night. She wears 3L/min at bedtime. She had PSG last summer [facility] - said she does not have OSA but needs O2.   ROV 02/25/13 -- f/u for sarcoidosis, assoc ILD, mixed obstructive and restrictive disease. She has had trouble with cough, prod of thick yellow phlegm and then green phlegm. Was treated with abx in 11/'14 and the phlegm cleared. Continued to have sinus drainage. Then had recurrent bout of bronchitis end of Dec. Just finished a round of levaquin. She still has hoarse voice, cough.  She is using fluticasone, generic benadryl some nights. She is now on pantoprazole (was on omeprazole). Just discovered that her Vit D is low.   ROV 07/09/13 -- hx sarcoidosis, resultant ILD, mixed obstruction and restriction. Also hx allergies and chronic cough.  She is on Symbicort, flonase, loratadine. Was treated with levaquin again beginning . She describes a month of worsening exertional SOB and she has noticed low oxygen levels with exertion. She had some increasing edema, hypertension.  Some occas cough but not much, some low grade fever. Hoarse voice. Lots of nasal congestion on fluticasone and loratadine. She was started on lasix by her PCP.  She has not been using her O2 w exertion.    ROV 08/05/13 -- hx sarcoidosis, resultant ILD, mixed obstruction and restriction; allergies and chronic cough. Last visit she had increased edema and there was concern for  decompensation of secondary pulmonary hypertension. We performed echocardiogram and repeated her CT scan of the chest as below. Her RVSP and right ventricular function were normal. Her CT scan of the chest was grossly unchanged compared with 2012. She continues to have some LE edema. She is on symbicort, SABA prn.  Her breathing may be a bit better since she started using O2 more reliably.    Acute OV  hx sarcoidosis, resultant ILD, mixed obstruction and restriction; allergies and chronic cough. Complains of increased SOB, wheezing, prod cough with yellow mucus, head congestion and PND, some occasional chills/sweats x 4 weeks, worse x1 week.   Denies any hemoptysis , chest pain, orthopnea, fever, edema.  No recent abx or steroids  Says BS are well controlled on her Insulin regimen   ROV 01/07/14 -- follow up for her sarcoidosis, associated ILD, chronic cough.  She was recently treated for an AE with improvement. She is feeling tired all the time, can nap and fall asleep. O2 on 3L/min pulsed. She is on symbicort + SABA prn, uses about 2x a day.   ROV 02/04/14 -- follow up visit for follow up for her sarcoidosis, associated ILD, chronic cough. Last time we stopped Symbicort and started stiolto > she can't tell very much difference. Her cough has worsened over the last 2 weeks, associated with some yellow mucous. Some nasal congestion. She has had multiple sick contacts. She hasn't tried any OTC meds.  Acute OV  07/05/14 --  Pt returns for follow up for sarcoid flare.  Seen at PCP yesterday , started on pred taper.  Says she has been weak with progressive DOE for last few months . Worse for last couple of weeks.  Labs were done shown  she had progressive anemia.  Says she has been undergoing anemia workup with recent endo and colonoscopy. Hbg tr down from 11.6  to 9 . CXR showed chronic changes.  Denies NSAID use.  Remains on Stiolto . On 3l/m of O2 , sats good at 96%.  She denies any fever,  productive cough, discolored mucus, orthopnea, PND, or increased leg swelling. Has not noticed much difference in her breathing since yesterday.  denies rash.  ROV 08/06/14 -- follow up visit for sarcoidosis, ILD, chronic cough, chronic hypoxemia.  Also anemia as described above, Hgb from 10 > 8.8. She has been started on Fe, has not required transfusion. She has been on pred x 2 since 12/15, on both occasional for progressive exertional dyspnea. Her O2 is set on 3L/min at all times. Her exertional tolerance is low. She has CP at times w exertion. She has cough, productive, worst at night when supine. She is on pantoprazole 40 bid. She remains on stiolto, isn't sure whether it is helping her.       Objective:   Physical Exam Filed Vitals:   08/06/14 1619  BP: 150/82  Pulse: 125  Weight: 201 lb (91.173 kg)  SpO2: 98%    Gen: obese woman, no distress   ENT: No lesions,  mouth clear,  oropharynx clear, no postnasal drip  Neck: No JVD, no TMG, no carotid bruits  Lungs: No use of accessory muscles, no dullness to percussion, Coarse BS w/ no wheezing   Cardiovascular: RRR, heart sounds normal, no murmur or gallops, tr  peripheral edema  Musculoskeletal: No deformities, no cyanosis or clubbing  Neuro: alert, non focal  Skin: Warm, no lesions or rashes    07/04/14 --  COMPARISON: 02/27/2014 and 03/09/2013  FINDINGS: There are no acute infiltrates or effusions. There is chronic accentuation of the interstitial markings with tortuosity and calcification of the thoracic aorta. Chronic blunting of the left costophrenic angle laterally. Chronic thoracolumbar scoliosis.  Pulmonary vascularity appears normal. No acute osseous abnormality.  IMPRESSION: Chronic interstitial lung disease. No acute abnormalities     Assessment & Plan:   Chronic cough Continue max dose treatment for GERD and for allergic rhinitis  INTERSTITIAL LUNG DISEASE Stable based on chest x-ray from  06/2014  PULMONARY SARCOIDOSIS Start prednisone 30 mg daily for the next month and then reassess. She has a stable left upper lobe opacity on serial CT scans of the chest. I do not believe we need to recheck a CT at this time. Her last one was in June/2015. Depending on our progress after starting daily prednisone we may decide to reimage.   Diabetes mellitus type 2, controlled Cautioned the patient that she will need to follow her sugars and her weight closely with the initiation of daily prednisone. She agreed to do this and call us if there are any significant changes  Anemia Currently under  Evaluation for anemia. She has had a reassuring endoscopy and colonoscopy. She was just started on iron supplements. Certainly anemia could be a contributor to her exertional dyspnea.

## 2014-08-06 NOTE — Assessment & Plan Note (Signed)
Stable based on chest x-ray from 06/2014

## 2014-08-09 ENCOUNTER — Other Ambulatory Visit: Payer: Self-pay | Admitting: Emergency Medicine

## 2014-08-09 DIAGNOSIS — J449 Chronic obstructive pulmonary disease, unspecified: Secondary | ICD-10-CM | POA: Diagnosis not present

## 2014-08-09 DIAGNOSIS — E119 Type 2 diabetes mellitus without complications: Secondary | ICD-10-CM | POA: Diagnosis not present

## 2014-08-09 DIAGNOSIS — I10 Essential (primary) hypertension: Secondary | ICD-10-CM | POA: Diagnosis not present

## 2014-08-09 DIAGNOSIS — R079 Chest pain, unspecified: Secondary | ICD-10-CM | POA: Diagnosis not present

## 2014-08-13 ENCOUNTER — Telehealth: Payer: Self-pay | Admitting: Emergency Medicine

## 2014-08-13 NOTE — Telephone Encounter (Signed)
Per RB >> if her blood sugar is running at these numbers, she needs to contact whoever manages her diabetes and let them know that she is on a new medication, her insulin will need to be adjusted.

## 2014-08-13 NOTE — Telephone Encounter (Signed)
Spoke with pt, she is aware of the below recs.  Nothing further needed.

## 2014-08-13 NOTE — Telephone Encounter (Signed)
Patient was put on Prednisone, it is causing her BS to go up.  She is still taking prednisone. This morning, her BS was 334, she checked it again 1-2 hours after breakfast and BS was 430.   Patient says that she is feeling weak and tired.  Patient would like to know what she should do.  RB - please advise.

## 2014-08-19 DIAGNOSIS — J449 Chronic obstructive pulmonary disease, unspecified: Secondary | ICD-10-CM | POA: Diagnosis not present

## 2014-08-28 DIAGNOSIS — D869 Sarcoidosis, unspecified: Secondary | ICD-10-CM | POA: Diagnosis not present

## 2014-08-28 DIAGNOSIS — Z87891 Personal history of nicotine dependence: Secondary | ICD-10-CM | POA: Diagnosis not present

## 2014-08-28 DIAGNOSIS — K219 Gastro-esophageal reflux disease without esophagitis: Secondary | ICD-10-CM | POA: Diagnosis not present

## 2014-08-28 DIAGNOSIS — E1165 Type 2 diabetes mellitus with hyperglycemia: Secondary | ICD-10-CM | POA: Diagnosis not present

## 2014-08-28 DIAGNOSIS — D649 Anemia, unspecified: Secondary | ICD-10-CM | POA: Diagnosis not present

## 2014-08-28 DIAGNOSIS — Z79899 Other long term (current) drug therapy: Secondary | ICD-10-CM | POA: Diagnosis not present

## 2014-08-28 DIAGNOSIS — E780 Pure hypercholesterolemia: Secondary | ICD-10-CM | POA: Diagnosis not present

## 2014-08-28 DIAGNOSIS — J9811 Atelectasis: Secondary | ICD-10-CM | POA: Diagnosis not present

## 2014-08-28 DIAGNOSIS — J209 Acute bronchitis, unspecified: Secondary | ICD-10-CM | POA: Diagnosis not present

## 2014-08-28 DIAGNOSIS — J8410 Pulmonary fibrosis, unspecified: Secondary | ICD-10-CM | POA: Diagnosis not present

## 2014-08-28 DIAGNOSIS — J189 Pneumonia, unspecified organism: Secondary | ICD-10-CM | POA: Diagnosis not present

## 2014-08-28 DIAGNOSIS — Z9981 Dependence on supplemental oxygen: Secondary | ICD-10-CM | POA: Diagnosis not present

## 2014-08-28 DIAGNOSIS — Z880 Allergy status to penicillin: Secondary | ICD-10-CM | POA: Diagnosis not present

## 2014-08-28 DIAGNOSIS — F329 Major depressive disorder, single episode, unspecified: Secondary | ICD-10-CM | POA: Diagnosis not present

## 2014-08-28 DIAGNOSIS — I083 Combined rheumatic disorders of mitral, aortic and tricuspid valves: Secondary | ICD-10-CM | POA: Diagnosis not present

## 2014-08-28 DIAGNOSIS — R0602 Shortness of breath: Secondary | ICD-10-CM | POA: Diagnosis not present

## 2014-08-28 DIAGNOSIS — J449 Chronic obstructive pulmonary disease, unspecified: Secondary | ICD-10-CM | POA: Diagnosis not present

## 2014-08-28 DIAGNOSIS — G43909 Migraine, unspecified, not intractable, without status migrainosus: Secondary | ICD-10-CM | POA: Diagnosis not present

## 2014-08-28 DIAGNOSIS — Z794 Long term (current) use of insulin: Secondary | ICD-10-CM | POA: Diagnosis not present

## 2014-08-28 DIAGNOSIS — J9601 Acute respiratory failure with hypoxia: Secondary | ICD-10-CM | POA: Diagnosis not present

## 2014-08-28 DIAGNOSIS — J45909 Unspecified asthma, uncomplicated: Secondary | ICD-10-CM | POA: Diagnosis not present

## 2014-08-28 DIAGNOSIS — I509 Heart failure, unspecified: Secondary | ICD-10-CM | POA: Diagnosis not present

## 2014-08-28 DIAGNOSIS — R6510 Systemic inflammatory response syndrome (SIRS) of non-infectious origin without acute organ dysfunction: Secondary | ICD-10-CM | POA: Diagnosis not present

## 2014-08-28 DIAGNOSIS — J9600 Acute respiratory failure, unspecified whether with hypoxia or hypercapnia: Secondary | ICD-10-CM | POA: Diagnosis not present

## 2014-08-28 DIAGNOSIS — Z881 Allergy status to other antibiotic agents status: Secondary | ICD-10-CM | POA: Diagnosis not present

## 2014-08-31 ENCOUNTER — Telehealth: Payer: Self-pay | Admitting: Emergency Medicine

## 2014-08-31 NOTE — Telephone Encounter (Signed)
Attempted to return call of dr green regarding admission to the hospital, management. I was unable to get him at [phone], believe that this a wrong number. Will try to find oput where she is admitted and reach out to the physicians there.   Let me know if dr green said where Penny Curry was admitted, I will try to reach them. Thanks.

## 2014-09-01 NOTE — Telephone Encounter (Signed)
Dr. Nyoka Cowden was calling from [facility].

## 2014-09-03 DIAGNOSIS — E1165 Type 2 diabetes mellitus with hyperglycemia: Secondary | ICD-10-CM | POA: Diagnosis not present

## 2014-09-03 DIAGNOSIS — K219 Gastro-esophageal reflux disease without esophagitis: Secondary | ICD-10-CM | POA: Diagnosis not present

## 2014-09-03 DIAGNOSIS — J8410 Pulmonary fibrosis, unspecified: Secondary | ICD-10-CM | POA: Diagnosis not present

## 2014-09-03 DIAGNOSIS — D869 Sarcoidosis, unspecified: Secondary | ICD-10-CM | POA: Diagnosis not present

## 2014-09-03 DIAGNOSIS — J209 Acute bronchitis, unspecified: Secondary | ICD-10-CM | POA: Diagnosis not present

## 2014-09-03 NOTE — Telephone Encounter (Signed)
I will try again to reach him

## 2014-09-06 ENCOUNTER — Emergency Department (HOSPITAL_COMMUNITY): Payer: Medicare Other

## 2014-09-06 ENCOUNTER — Observation Stay (HOSPITAL_COMMUNITY)
Admission: EM | Admit: 2014-09-06 | Discharge: 2014-09-08 | Disposition: A | Discharge status: Home / Self Care | Payer: Medicare Other | Attending: Internal Medicine | Admitting: Internal Medicine

## 2014-09-06 ENCOUNTER — Other Ambulatory Visit: Payer: Self-pay

## 2014-09-06 DIAGNOSIS — H269 Unspecified cataract: Secondary | ICD-10-CM | POA: Diagnosis not present

## 2014-09-06 DIAGNOSIS — Z7951 Long term (current) use of inhaled steroids: Secondary | ICD-10-CM | POA: Diagnosis not present

## 2014-09-06 DIAGNOSIS — K219 Gastro-esophageal reflux disease without esophagitis: Secondary | ICD-10-CM | POA: Insufficient documentation

## 2014-09-06 DIAGNOSIS — Z87891 Personal history of nicotine dependence: Secondary | ICD-10-CM | POA: Insufficient documentation

## 2014-09-06 DIAGNOSIS — I200 Unstable angina: Secondary | ICD-10-CM | POA: Insufficient documentation

## 2014-09-06 DIAGNOSIS — Z794 Long term (current) use of insulin: Secondary | ICD-10-CM | POA: Diagnosis not present

## 2014-09-06 DIAGNOSIS — M1990 Unspecified osteoarthritis, unspecified site: Secondary | ICD-10-CM | POA: Diagnosis not present

## 2014-09-06 DIAGNOSIS — R079 Chest pain, unspecified: Secondary | ICD-10-CM | POA: Diagnosis not present

## 2014-09-06 DIAGNOSIS — M810 Age-related osteoporosis without current pathological fracture: Secondary | ICD-10-CM | POA: Insufficient documentation

## 2014-09-06 DIAGNOSIS — J8410 Pulmonary fibrosis, unspecified: Secondary | ICD-10-CM | POA: Diagnosis not present

## 2014-09-06 DIAGNOSIS — Z79899 Other long term (current) drug therapy: Secondary | ICD-10-CM | POA: Insufficient documentation

## 2014-09-06 DIAGNOSIS — R112 Nausea with vomiting, unspecified: Secondary | ICD-10-CM | POA: Insufficient documentation

## 2014-09-06 DIAGNOSIS — D649 Anemia, unspecified: Secondary | ICD-10-CM | POA: Diagnosis present

## 2014-09-06 DIAGNOSIS — N390 Urinary tract infection, site not specified: Secondary | ICD-10-CM | POA: Diagnosis not present

## 2014-09-06 DIAGNOSIS — E119 Type 2 diabetes mellitus without complications: Secondary | ICD-10-CM

## 2014-09-06 DIAGNOSIS — E785 Hyperlipidemia, unspecified: Secondary | ICD-10-CM | POA: Insufficient documentation

## 2014-09-06 DIAGNOSIS — I10 Essential (primary) hypertension: Secondary | ICD-10-CM | POA: Insufficient documentation

## 2014-09-06 DIAGNOSIS — F329 Major depressive disorder, single episode, unspecified: Secondary | ICD-10-CM | POA: Diagnosis not present

## 2014-09-06 DIAGNOSIS — I509 Heart failure, unspecified: Secondary | ICD-10-CM | POA: Insufficient documentation

## 2014-09-06 DIAGNOSIS — R0602 Shortness of breath: Secondary | ICD-10-CM | POA: Diagnosis not present

## 2014-09-06 DIAGNOSIS — R0789 Other chest pain: Secondary | ICD-10-CM | POA: Diagnosis not present

## 2014-09-06 DIAGNOSIS — J441 Chronic obstructive pulmonary disease with (acute) exacerbation: Secondary | ICD-10-CM | POA: Insufficient documentation

## 2014-09-06 DIAGNOSIS — J209 Acute bronchitis, unspecified: Secondary | ICD-10-CM | POA: Diagnosis not present

## 2014-09-06 DIAGNOSIS — D869 Sarcoidosis, unspecified: Secondary | ICD-10-CM | POA: Diagnosis not present

## 2014-09-06 DIAGNOSIS — E1165 Type 2 diabetes mellitus with hyperglycemia: Secondary | ICD-10-CM | POA: Diagnosis not present

## 2014-09-06 DIAGNOSIS — J449 Chronic obstructive pulmonary disease, unspecified: Secondary | ICD-10-CM | POA: Diagnosis not present

## 2014-09-06 DIAGNOSIS — Z880 Allergy status to penicillin: Secondary | ICD-10-CM | POA: Diagnosis not present

## 2014-09-06 DIAGNOSIS — J9611 Chronic respiratory failure with hypoxia: Secondary | ICD-10-CM | POA: Insufficient documentation

## 2014-09-06 DIAGNOSIS — J8489 Other specified interstitial pulmonary diseases: Secondary | ICD-10-CM | POA: Diagnosis not present

## 2014-09-06 DIAGNOSIS — R2240 Localized swelling, mass and lump, unspecified lower limb: Secondary | ICD-10-CM | POA: Diagnosis not present

## 2014-09-06 DIAGNOSIS — I272 Pulmonary hypertension, unspecified: Secondary | ICD-10-CM | POA: Diagnosis present

## 2014-09-06 LAB — BASIC METABOLIC PANEL
Anion gap: 13 (ref 5–15)
BUN: 27 mg/dL — ABNORMAL HIGH (ref 6–20)
CALCIUM: 9.1 mg/dL (ref 8.9–10.3)
CO2: 28 mmol/L (ref 22–32)
Chloride: 96 mmol/L — ABNORMAL LOW (ref 101–111)
Creatinine, Ser: 0.87 mg/dL (ref 0.44–1.00)
Glucose, Bld: 211 mg/dL — ABNORMAL HIGH (ref 65–99)
POTASSIUM: 4.2 mmol/L (ref 3.5–5.1)
Sodium: 137 mmol/L (ref 135–145)

## 2014-09-06 LAB — TROPONIN I: TROPONIN I: 0.03 ng/mL (ref <0.031)

## 2014-09-06 LAB — CBC WITH DIFFERENTIAL/PLATELET
Basophils Absolute: 0 10*3/uL (ref 0.0–0.1)
Basophils Relative: 0 % (ref 0–1)
Eosinophils Absolute: 0 10*3/uL (ref 0.0–0.7)
Eosinophils Relative: 0 % (ref 0–5)
HCT: 33.7 % — ABNORMAL LOW (ref 36.0–46.0)
Hemoglobin: 11 g/dL — ABNORMAL LOW (ref 12.0–15.0)
LYMPHS ABS: 1.6 10*3/uL (ref 0.7–4.0)
LYMPHS PCT: 19 % (ref 12–46)
MCH: 30.1 pg (ref 26.0–34.0)
MCHC: 32.6 g/dL (ref 30.0–36.0)
MCV: 92.1 fL (ref 78.0–100.0)
MONO ABS: 0.5 10*3/uL (ref 0.1–1.0)
Monocytes Relative: 6 % (ref 3–12)
NEUTROS PCT: 75 % (ref 43–77)
Neutro Abs: 6.2 10*3/uL (ref 1.7–7.7)
PLATELETS: 182 10*3/uL (ref 150–400)
RBC: 3.66 MIL/uL — AB (ref 3.87–5.11)
RDW: 15.5 % (ref 11.5–15.5)
SMEAR REVIEW: ADEQUATE
WBC: 8.3 10*3/uL (ref 4.0–10.5)

## 2014-09-06 LAB — I-STAT TROPONIN, ED: Troponin i, poc: 0.01 ng/mL (ref 0.00–0.08)

## 2014-09-06 LAB — BRAIN NATRIURETIC PEPTIDE: B NATRIURETIC PEPTIDE 5: 127.2 pg/mL — AB (ref 0.0–100.0)

## 2014-09-06 MED ORDER — FUROSEMIDE 10 MG/ML IJ SOLN
40.0000 mg | INTRAMUSCULAR | Status: AC
  Administered 2014-09-06: 40 mg via INTRAVENOUS
  Filled 2014-09-06: qty 4

## 2014-09-06 NOTE — ED Notes (Signed)
Family at bedside. 

## 2014-09-06 NOTE — ED Provider Notes (Signed)
CSN: [id number]     Arrival date & time 09/06/14  2110 History   First MD Initiated Contact with Patient 09/06/14 2111     Chief Complaint  Patient presents with  . Chest Pain    (Consider location/radiation/quality/duration/timing/severity/associated sxs/prior Treatment) HPI Comments: 74 year old female with a history of hypertension, diabetes, CHF, COPD on chronic 3 L O2 via nasal cannula, and dyslipidemia presents to the emergency department for further evaluation of chest pain. Patient reports that she was in her home this afternoon when she developed chest pain at 1600. She describes the pain as a central pressure like sensation which was associated with diaphoresis, nausea, and one episode of vomiting. Patient reports that she felt short of breath with her symptoms. She denies any syncope. No recent fevers. She states that she has no pain at present. Patient given 324mg  ASA, NTG x 3, and zofran en route by EMS.  Patient reports that she has a home health nurse who comes and helps care for her. She reports noticing a recent 10+ pound weight gain since her discharge from [facility] 4 days ago. Patient was recently admitted for a CHF related illness. She states that she needed to be given multiple doses of IV Lasix during this admission. She denies any changes to her current medication regimen. Patient has been compliant with her medications. She denies seeing a cardiologist; she did see [facility] 3+ years ago. She is followed by Dr. Lamonte Sakai of Pulmonology. She denies a hx of ACS.  Patient reports an echo being completed during her admission to [facility]; she does not recall the results of this test. Echo from 07/2013 shows LVEF of 65-70%.  Patient is a 74 y.o. female presenting with chest pain. The history is provided by the patient. No language interpreter was used.  Chest Pain Associated symptoms: diaphoresis, nausea, shortness of breath and vomiting   Associated symptoms: no  fever     Past Medical History  Diagnosis Date  . HTN (hypertension)   . Diabetes mellitus   . Fibrosis of lung   . CHF (congestive heart failure)   . GERD (gastroesophageal reflux disease)   . Chronic headache     migraines  . Complication of anesthesia   . PONV (postoperative nausea and vomiting)   . Shortness of breath   . UTI (urinary tract infection)     " MULTIPLE TIMES THIS PAST YEAR "  . Arthritis   . Anemia   . Cataract   . COPD (chronic obstructive pulmonary disease)   . Hyperlipidemia   . Depression   . Osteoporosis    Past Surgical History  Procedure Laterality Date  . Neck surgery  1987  . Partial hysterectomy    . Tubal ligation    . Cataract extraction    . Abdominal hysterectomy     Family History  Problem Relation Age of Onset  . Prostate cancer Brother   . Heart disease Brother   . Stroke Mother   . Diabetes Mother   . Asthma Son   . Emphysema Father   . Heart disease Father   . Lung cancer Sister   . Heart disease Sister   . Heart disease Brother    History  Substance Use Topics  . Smoking status: Former Smoker -- 1.00 packs/day for 8 years    Types: Cigarettes    Quit date: 02/20/1968  . Smokeless tobacco: Never Used  . Alcohol Use: No   OB History  No data available      Review of Systems  Constitutional: Positive for diaphoresis. Negative for fever.  Respiratory: Positive for shortness of breath.   Cardiovascular: Positive for chest pain and leg swelling.  Gastrointestinal: Positive for nausea and vomiting.  Neurological: Negative for syncope.  All other systems reviewed and are negative.   Allergies  Clindamycin/lincomycin; Penicillins; and Tape  Home Medications   Prior to Admission medications   Medication Sig Start Date End Date Taking? Authorizing Provider  budesonide-formoterol (SYMBICORT) 160-4.5 MCG/ACT inhaler Inhale 2 puffs into the lungs 2 (two) times daily.    Historical Provider, MD  citalopram (CELEXA)  40 MG tablet Take 40 mg by mouth daily.    Historical Provider, MD  Colesevelam HCl 3.75 G PACK Take 1 packet by mouth every evening.    Historical Provider, MD  divalproex (DEPAKOTE) 500 MG EC tablet Take 1,000 mg by mouth every evening.     Historical Provider, MD  ergocalciferol (VITAMIN D2) 50000 UNITS capsule Take 50,000 Units by mouth every Friday.    Historical Provider, MD  fluticasone (FLONASE) 50 MCG/ACT nasal spray Place 2 sprays into both nostrils daily. 05/18/14   Collene Gobble, MD  furosemide (LASIX) 40 MG tablet 2 tabs by mouth once daily    Historical Provider, MD  gabapentin (NEURONTIN) 300 MG capsule Take 300 mg by mouth at bedtime.    Historical Provider, MD  ibuprofen (ADVIL,MOTRIN) 200 MG tablet Take 400 mg by mouth every 6 (six) hours as needed for moderate pain.    Historical Provider, MD  insulin aspart (NOVOLOG FLEXPEN) 100 UNIT/ML injection Inject 5 Units into the skin 3 (three) times daily before meals.     Historical Provider, MD  insulin detemir (LEVEMIR FLEXPEN) 100 UNIT/ML injection Inject 30 Units into the skin daily with breakfast.     Historical Provider, MD  loratadine (CLARITIN) 10 MG tablet Take 1 tablet (10 mg total) by mouth daily. 02/05/14   Collene Gobble, MD  pantoprazole (PROTONIX) 40 MG tablet Take 40 mg by mouth 2 (two) times daily.     Historical Provider, MD  potassium chloride (KLOR-CON) 10 MEQ CR tablet Take 20 mEq by mouth daily before breakfast.     Historical Provider, MD  predniSONE (DELTASONE) 10 MG tablet Take 3 tablets (30 mg total) by mouth daily with breakfast. 08/06/14   Collene Gobble, MD  PROAIR HFA 108 (90 BASE) MCG/ACT inhaler Use 2 puffs every 6 hours  as needed for wheezing or  shortness of breath 08/09/14   Collene Gobble, MD  rOPINIRole (REQUIP) 3 MG tablet Take 3 mg by mouth 2 (two) times daily.      Historical Provider, MD  spironolactone (ALDACTONE) 25 MG tablet Take 1 tablet by mouth 2 (two) times daily. 11/12/13   Historical  Provider, MD  Tiotropium Bromide-Olodaterol (STIOLTO RESPIMAT) 2.5-2.5 MCG/ACT AERS Inhale 2 puffs into the lungs daily. 05/18/14   Collene Gobble, MD  traMADol Veatrice Bourbon) 50 MG tablet Takes 1 tablet in the morning and 2 tablets at night 05/23/11   Tanda Rockers, MD   BP 121/64 mmHg  Pulse 64  Temp(Src) 99 F (37.2 C) (Oral)  Resp 22  Ht 5\' 5"  (1.651 m)  Wt 210 lb (95.255 kg)  BMI 34.95 kg/m2  SpO2 97%   Physical Exam  Constitutional: She is oriented to person, place, and time. She appears well-developed and well-nourished. No distress.  Nontoxic/nonseptic appearing  HENT:  Head: Normocephalic and  atraumatic.  Eyes: Conjunctivae and EOM are normal. No scleral icterus.  Neck: Normal range of motion.  Cardiovascular: Normal rate, regular rhythm and intact distal pulses.   Pulmonary/Chest: Effort normal. No respiratory distress. She has no wheezes. She has no rales.  Decreased breath sounds in all lung fields. Chest expansion symmetric. No rales or rhonchi.  Musculoskeletal: Normal range of motion. She exhibits edema.  1+ pitting edema in b/l lower extremities.  Neurological: She is alert and oriented to person, place, and time. She exhibits normal muscle tone. Coordination normal.  GCS 15. Speech is goal oriented. Patient moves extremities without ataxia.  Skin: Skin is warm and dry. No rash noted. She is not diaphoretic. No erythema. No pallor.  Psychiatric: She has a normal mood and affect. Her behavior is normal.  Nursing note and vitals reviewed.   ED Course  Procedures (including critical care time) Labs Review Labs Reviewed  CBC WITH DIFFERENTIAL/PLATELET - Abnormal; Notable for the following:    RBC 3.66 (*)    Hemoglobin 11.0 (*)    HCT 33.7 (*)    All other components within normal limits  BASIC METABOLIC PANEL - Abnormal; Notable for the following:    Chloride 96 (*)    Glucose, Bld 211 (*)    BUN 27 (*)    All other components within normal limits  BRAIN  NATRIURETIC PEPTIDE  TROPONIN I  I-STAT TROPOININ, ED    Imaging Review Dg Chest 2 View  09/06/2014   CLINICAL DATA:  Chest pain, shortness of breath and nausea tonight, history hypertension, diabetes mellitus, pulmonary fibrosis, CHF, GERD, COPD  EXAM: CHEST  2 VIEW  COMPARISON:  08/28/2014  FINDINGS: Upper normal heart size.  Normal mediastinal contours and pulmonary vascularity.  Chronic interstitial prominence and peribronchial thickening unchanged.  No acute infiltrate, pleural effusion or pneumothorax.  Bones demineralized.  Dextro convex thoracic scoliosis.  IMPRESSION: Chronic bronchitic and interstitial changes.  No acute abnormalities.   Electronically Signed   By: Lavonia Dana M.D.   On: 09/06/2014 22:04      EKG Interpretation   Date/Time:  Monday September 06 2014 21:21:07 EDT Ventricular Rate:  94 PR Interval:  113 QRS Duration: 75 QT Interval:  352 QTC Calculation: 440 R Axis:   64 Text Interpretation:  Sinus rhythm Atrial premature complex Borderline  short PR interval Borderline repolarization abnormality No significant  change since last tracing Confirmed by Kathrynn Humble, MD, Thelma Comp [phone]) on  09/06/2014 10:50:21 PM      Heart Score - 5 (suspicion, age, and risk factors) MDM   Final diagnoses:  Chest pain    74 y/o female presents to the ED for chest pain. Heart score is 5 c/w moderate risk of ACS. Initial cardiac work up is reassuring. No evidence of significant fluid overload to suggest CHF exacerbation. Plan admission for ACS rule out. Case discussed with IM teaching who will evaluate the patient for admission. Patient states that she is still chest pain free at present. She is satting well on her chronic 3L O2.   Filed Vitals:   09/06/14 2110 09/06/14 2145 09/06/14 2230 09/06/14 2326  BP: 121/64 117/61 113/55 123/54  Pulse: 64 85 86 83  Temp: 99 F (37.2 C)   97.7 F (36.5 C)  TempSrc: Oral   Oral  Resp: 22 18 16 16   Height: 5\' 5"  (1.651 m)     Weight: 210  lb (95.255 kg)     SpO2: 97% 100% 99% 100%  Antonietta Breach, PA-C 09/06/14 Friend, MD 09/08/14 [phone]

## 2014-09-06 NOTE — ED Notes (Signed)
Patient here with complaint of chest pain starting tonight. Recently was admitted to [facility] with CHF related illness. Today was found by home care giver to have a 10+ lb weight gain. EMS was called; patient found per EMS to be diaphoretic, complaining of chest pain, and with swollen abdomen. On 3L via [place] chronically. EMS reported that patient never required additional O2. Rec'd 3x NTG, ASA 325mg , and Zofran en route.

## 2014-09-07 ENCOUNTER — Observation Stay (HOSPITAL_COMMUNITY): Payer: Medicare Other

## 2014-09-07 ENCOUNTER — Encounter (HOSPITAL_COMMUNITY): Payer: Self-pay | Admitting: *Deleted

## 2014-09-07 ENCOUNTER — Ambulatory Visit (HOSPITAL_COMMUNITY): Payer: Medicare Other

## 2014-09-07 DIAGNOSIS — J9611 Chronic respiratory failure with hypoxia: Secondary | ICD-10-CM | POA: Insufficient documentation

## 2014-09-07 DIAGNOSIS — Z9981 Dependence on supplemental oxygen: Secondary | ICD-10-CM

## 2014-09-07 DIAGNOSIS — R079 Chest pain, unspecified: Secondary | ICD-10-CM | POA: Diagnosis present

## 2014-09-07 DIAGNOSIS — J9610 Chronic respiratory failure, unspecified whether with hypoxia or hypercapnia: Secondary | ICD-10-CM

## 2014-09-07 DIAGNOSIS — E119 Type 2 diabetes mellitus without complications: Secondary | ICD-10-CM | POA: Diagnosis not present

## 2014-09-07 DIAGNOSIS — Z794 Long term (current) use of insulin: Secondary | ICD-10-CM

## 2014-09-07 DIAGNOSIS — I200 Unstable angina: Secondary | ICD-10-CM | POA: Diagnosis not present

## 2014-09-07 DIAGNOSIS — G2581 Restless legs syndrome: Secondary | ICD-10-CM

## 2014-09-07 DIAGNOSIS — R2240 Localized swelling, mass and lump, unspecified lower limb: Secondary | ICD-10-CM | POA: Diagnosis not present

## 2014-09-07 DIAGNOSIS — R072 Precordial pain: Secondary | ICD-10-CM | POA: Diagnosis not present

## 2014-09-07 DIAGNOSIS — I10 Essential (primary) hypertension: Secondary | ICD-10-CM | POA: Diagnosis not present

## 2014-09-07 DIAGNOSIS — R112 Nausea with vomiting, unspecified: Secondary | ICD-10-CM | POA: Diagnosis not present

## 2014-09-07 LAB — COMPREHENSIVE METABOLIC PANEL
ALBUMIN: 3.3 g/dL — AB (ref 3.5–5.0)
ALT: 54 U/L (ref 14–54)
AST: 28 U/L (ref 15–41)
Alkaline Phosphatase: 54 U/L (ref 38–126)
Anion gap: 7 (ref 5–15)
BILIRUBIN TOTAL: 0.8 mg/dL (ref 0.3–1.2)
BUN: 24 mg/dL — ABNORMAL HIGH (ref 6–20)
CALCIUM: 8.8 mg/dL — AB (ref 8.9–10.3)
CO2: 36 mmol/L — ABNORMAL HIGH (ref 22–32)
Chloride: 94 mmol/L — ABNORMAL LOW (ref 101–111)
Creatinine, Ser: 0.91 mg/dL (ref 0.44–1.00)
GFR calc Af Amer: >60 mL/min (ref >60)
GFR calc non Af Amer: >60 mL/min (ref >60)
GLUCOSE: 138 mg/dL — AB (ref 65–99)
POTASSIUM: 3.6 mmol/L (ref 3.5–5.1)
SODIUM: 137 mmol/L (ref 135–145)
Total Protein: 6.1 g/dL — ABNORMAL LOW (ref 6.5–8.1)

## 2014-09-07 LAB — GLUCOSE, CAPILLARY
GLUCOSE-CAPILLARY: 306 mg/dL — AB (ref 65–99)
GLUCOSE-CAPILLARY: 492 mg/dL — AB (ref 65–99)
Glucose-Capillary: 141 mg/dL — ABNORMAL HIGH (ref 65–99)
Glucose-Capillary: 141 mg/dL — ABNORMAL HIGH (ref 65–99)
Glucose-Capillary: 475 mg/dL — ABNORMAL HIGH (ref 65–99)
Glucose-Capillary: 400 mg/dL — ABNORMAL HIGH (ref 65–99)
Glucose-Capillary: 307 mg/dL — ABNORMAL HIGH (ref 65–99)

## 2014-09-07 LAB — TROPONIN I
TROPONIN I: 0.03 ng/mL (ref <0.031)
Troponin I: 0.03 ng/mL (ref <0.031)
Troponin I: <0.03 ng/mL (ref <0.031)

## 2014-09-07 LAB — TSH: TSH: 3.298 u[IU]/mL (ref 0.350–4.500)

## 2014-09-07 MED ORDER — CITALOPRAM HYDROBROMIDE 20 MG PO TABS
40.0000 mg | ORAL_TABLET | Freq: Every day | ORAL | Status: DC
  Administered 2014-09-07 (×2): 40 mg via ORAL
  Filled 2014-09-07 (×2): qty 2

## 2014-09-07 MED ORDER — INSULIN ASPART 100 UNIT/ML ~~LOC~~ SOLN
0.0000 [IU] | Freq: Three times a day (TID) | SUBCUTANEOUS | Status: DC
  Administered 2014-09-07: 1 [IU] via SUBCUTANEOUS
  Administered 2014-09-07: 7 [IU] via SUBCUTANEOUS

## 2014-09-07 MED ORDER — INSULIN ASPART 100 UNIT/ML ~~LOC~~ SOLN
0.0000 [IU] | Freq: Three times a day (TID) | SUBCUTANEOUS | Status: DC
  Administered 2014-09-08: 3 [IU] via SUBCUTANEOUS
  Administered 2014-09-08: 8 [IU] via SUBCUTANEOUS

## 2014-09-07 MED ORDER — LORATADINE 10 MG PO TABS
10.0000 mg | ORAL_TABLET | Freq: Every day | ORAL | Status: DC
  Administered 2014-09-07 – 2014-09-08 (×2): 10 mg via ORAL
  Filled 2014-09-07 (×2): qty 1

## 2014-09-07 MED ORDER — ACETAMINOPHEN 325 MG PO TABS
650.0000 mg | ORAL_TABLET | ORAL | Status: DC | PRN
  Administered 2014-09-07: 650 mg via ORAL
  Filled 2014-09-07: qty 2

## 2014-09-07 MED ORDER — FUROSEMIDE 80 MG PO TABS
80.0000 mg | ORAL_TABLET | Freq: Every day | ORAL | Status: DC
  Administered 2014-09-07 – 2014-09-08 (×2): 80 mg via ORAL
  Filled 2014-09-07 (×2): qty 1

## 2014-09-07 MED ORDER — FLUTICASONE PROPIONATE 50 MCG/ACT NA SUSP
2.0000 | Freq: Every day | NASAL | Status: DC
  Administered 2014-09-07 – 2014-09-08 (×2): 2 via NASAL
  Filled 2014-09-07: qty 16

## 2014-09-07 MED ORDER — COLESEVELAM HCL 3.75 G PO PACK
1.0000 | PACK | Freq: Every evening | ORAL | Status: DC
  Administered 2014-09-07: 1 via ORAL
  Filled 2014-09-07 (×2): qty 1

## 2014-09-07 MED ORDER — TECHNETIUM TC 99M SESTAMIBI GENERIC - CARDIOLITE
10.0000 | Freq: Once | INTRAVENOUS | Status: AC | PRN
  Administered 2014-09-07: 10 via INTRAVENOUS

## 2014-09-07 MED ORDER — GABAPENTIN 300 MG PO CAPS
300.0000 mg | ORAL_CAPSULE | Freq: Every day | ORAL | Status: DC
  Administered 2014-09-07 (×2): 300 mg via ORAL
  Filled 2014-09-07 (×2): qty 1

## 2014-09-07 MED ORDER — CITALOPRAM HYDROBROMIDE 20 MG PO TABS: 40.0000 mg | ORAL_TABLET | Freq: Every day | ORAL | Status: DC

## 2014-09-07 MED ORDER — ALBUTEROL SULFATE (2.5 MG/3ML) 0.083% IN NEBU
2.5000 mg | INHALATION_SOLUTION | Freq: Four times a day (QID) | RESPIRATORY_TRACT | Status: DC
  Administered 2014-09-07 – 2014-09-08 (×3): 2.5 mg via RESPIRATORY_TRACT
  Filled 2014-09-07 (×3): qty 3

## 2014-09-07 MED ORDER — COLESEVELAM HCL 625 MG PO TABS: 1875.0000 mg | ORAL_TABLET | Freq: Every day | ORAL | Status: DC

## 2014-09-07 MED ORDER — REGADENOSON 0.4 MG/5ML IV SOLN
INTRAVENOUS | Status: AC
  Administered 2014-09-07: 0.4 mg via INTRAVENOUS
  Filled 2014-09-07: qty 5

## 2014-09-07 MED ORDER — INSULIN ASPART 100 UNIT/ML ~~LOC~~ SOLN
0.0000 [IU] | Freq: Every day | SUBCUTANEOUS | Status: DC
Start: 2014-09-07 — End: 2014-09-08
  Administered 2014-09-07: 4 [IU] via SUBCUTANEOUS

## 2014-09-07 MED ORDER — VITAMIN D (ERGOCALCIFEROL) 1.25 MG (50000 UNIT) PO CAPS: 50000.0000 [IU] | ORAL_CAPSULE | ORAL | Status: DC

## 2014-09-07 MED ORDER — PANTOPRAZOLE SODIUM 40 MG PO TBEC
40.0000 mg | DELAYED_RELEASE_TABLET | Freq: Two times a day (BID) | ORAL | Status: DC
  Administered 2014-09-07 – 2014-09-08 (×4): 40 mg via ORAL
  Filled 2014-09-07 (×4): qty 1

## 2014-09-07 MED ORDER — MORPHINE SULFATE 2 MG/ML IJ SOLN: 1.0000 mg | INTRAMUSCULAR | Status: DC | PRN

## 2014-09-07 MED ORDER — FUROSEMIDE 10 MG/ML IJ SOLN
20.0000 mg | Freq: Every day | INTRAMUSCULAR | Status: DC
  Administered 2014-09-07: 20 mg via INTRAVENOUS
  Filled 2014-09-07: qty 2

## 2014-09-07 MED ORDER — TRAMADOL HCL 50 MG PO TABS
50.0000 mg | ORAL_TABLET | Freq: Two times a day (BID) | ORAL | Status: DC
Start: 2014-09-07 — End: 2014-09-08
  Administered 2014-09-07 – 2014-09-08 (×4): 50 mg via ORAL
  Filled 2014-09-07 (×4): qty 1

## 2014-09-07 MED ORDER — ASPIRIN 81 MG PO CHEW: 81.0000 mg | CHEWABLE_TABLET | Freq: Every day | ORAL | Status: AC

## 2014-09-07 MED ORDER — TIOTROPIUM BROMIDE-OLODATEROL 2.5-2.5 MCG/ACT IN AERS: 2.0000 | INHALATION_SPRAY | Freq: Every day | RESPIRATORY_TRACT | Status: DC

## 2014-09-07 MED ORDER — PREDNISONE 20 MG PO TABS
40.0000 mg | ORAL_TABLET | Freq: Every day | ORAL | Status: DC
  Administered 2014-09-07 – 2014-09-08 (×2): 40 mg via ORAL
  Filled 2014-09-07: qty 2
  Filled 2014-09-07: qty 4

## 2014-09-07 MED ORDER — INSULIN ASPART 100 UNIT/ML ~~LOC~~ SOLN
5.0000 [IU] | Freq: Three times a day (TID) | SUBCUTANEOUS | Status: DC
  Administered 2014-09-08 (×2): 5 [IU] via SUBCUTANEOUS

## 2014-09-07 MED ORDER — DIVALPROEX SODIUM 500 MG PO DR TAB
1000.0000 mg | DELAYED_RELEASE_TABLET | Freq: Every evening | ORAL | Status: DC
  Administered 2014-09-07: 1000 mg via ORAL
  Filled 2014-09-07 (×2): qty 2

## 2014-09-07 MED ORDER — ASPIRIN 81 MG PO CHEW
81.0000 mg | CHEWABLE_TABLET | Freq: Every day | ORAL | Status: DC
  Administered 2014-09-07 – 2014-09-08 (×2): 81 mg via ORAL
  Filled 2014-09-07 (×2): qty 1

## 2014-09-07 MED ORDER — BUDESONIDE-FORMOTEROL FUMARATE 160-4.5 MCG/ACT IN AERO
2.0000 | INHALATION_SPRAY | Freq: Two times a day (BID) | RESPIRATORY_TRACT | Status: DC
  Administered 2014-09-07: 2 via RESPIRATORY_TRACT
  Filled 2014-09-07: qty 6

## 2014-09-07 MED ORDER — INSULIN ASPART 100 UNIT/ML ~~LOC~~ SOLN
15.0000 [IU] | Freq: Once | SUBCUTANEOUS | Status: AC
  Administered 2014-09-07: 15 [IU] via SUBCUTANEOUS

## 2014-09-07 MED ORDER — INSULIN DETEMIR 100 UNIT/ML ~~LOC~~ SOLN
30.0000 [IU] | Freq: Every day | SUBCUTANEOUS | Status: DC
  Administered 2014-09-07 – 2014-09-08 (×2): 30 [IU] via SUBCUTANEOUS
  Filled 2014-09-07 (×2): qty 0.3

## 2014-09-07 MED ORDER — ENOXAPARIN SODIUM 40 MG/0.4ML ~~LOC~~ SOLN
40.0000 mg | SUBCUTANEOUS | Status: DC
  Administered 2014-09-07 – 2014-09-08 (×2): 40 mg via SUBCUTANEOUS
  Filled 2014-09-07 (×2): qty 0.4

## 2014-09-07 MED ORDER — ROPINIROLE HCL 1 MG PO TABS
3.0000 mg | ORAL_TABLET | Freq: Two times a day (BID) | ORAL | Status: DC
  Administered 2014-09-07 – 2014-09-08 (×4): 3 mg via ORAL
  Filled 2014-09-07 (×5): qty 3
  Filled 2014-09-07: qty 6

## 2014-09-07 MED ORDER — SPIRONOLACTONE 25 MG PO TABS
25.0000 mg | ORAL_TABLET | Freq: Two times a day (BID) | ORAL | Status: DC
  Administered 2014-09-07 – 2014-09-08 (×4): 25 mg via ORAL
  Filled 2014-09-07 (×4): qty 1

## 2014-09-07 MED ORDER — TECHNETIUM TC 99M SESTAMIBI GENERIC - CARDIOLITE
30.0000 | Freq: Once | INTRAVENOUS | Status: AC | PRN
Start: 2014-09-07 — End: 2014-09-07
  Administered 2014-09-07: 30 via INTRAVENOUS

## 2014-09-07 MED ORDER — ALBUTEROL SULFATE (2.5 MG/3ML) 0.083% IN NEBU: 3.0000 mL | INHALATION_SOLUTION | RESPIRATORY_TRACT | Status: DC | PRN

## 2014-09-07 MED ORDER — COLESEVELAM HCL 3.75 G PO PACK: 1.0000 | PACK | Freq: Every evening | ORAL | Status: DC

## 2014-09-07 MED ORDER — REGADENOSON 0.4 MG/5ML IV SOLN
0.4000 mg | Freq: Once | INTRAVENOUS | Status: AC
Start: 2014-09-07 — End: 2014-09-07
  Administered 2014-09-07: 0.4 mg via INTRAVENOUS
  Filled 2014-09-07: qty 5

## 2014-09-07 NOTE — Progress Notes (Signed)
Pts CBG 493mg /dl. Pt asymtomatic. Md notified. awaiting orders

## 2014-09-07 NOTE — Progress Notes (Signed)
Subjective: Patient with complaints of chest pain. She says she was not active when pain started. She also endorses some vomiting with chest pain, and likely associated SOB- though she is SOB at baseline but is on her home 3L of O2. She does not know her baseline weight. She says she has been having some subjectively abdominal swelling- not acute, and does not know if her SOB improved after admission and recent Discharge from [facility]. Her leg swelling has improved, though she has been lying on the bed. She has not made too much urine since admission  Objective: Vital signs in last 24 hours: Filed Vitals:   09/07/14 0141 09/07/14 0500 09/07/14 0803 09/07/14 1006  BP: 115/58 114/64 129/67   Pulse: 78 76 80   Temp: 98.3 F (36.8 C) 97.7 F (36.5 C) 98 F (36.7 C)   TempSrc: Oral  Oral   Resp: 18 20 15    Height: 5\' 5"  (1.651 m)     Weight: 202 lb 4.8 oz (91.763 kg)     SpO2: 100% 98% 100% 98%   Weight change:  No intake or output data in the 24 hours ending 09/07/14 1105 General appearance: alert, cooperative, appears stated age and no distress Lungs: clear to auscultation bilaterally Heart: regular rate and rhythm, S1, S2 normal, no murmur, click, rub or gallop Abdomen: soft, full, but not tense, does not appear obviously/markedly distended, non-tender; bowel sounds normal; no masses, no organomegaly Extremities: extremities normal, atraumatic, no cyanosis or edema Pulses: 2+ and symmetric  Lab Results: Basic Metabolic Panel:  Recent Labs Lab 09/06/14 2150 09/07/14 0857  NA 137 137  K 4.2 3.6  CL 96* 94*  CO2 28 36*  GLUCOSE 211* 138*  BUN 27* 24*  CREATININE 0.87 0.91  CALCIUM 9.1 8.8*   Liver Function Tests:  Recent Labs Lab 09/07/14 0857  AST 28  ALT 54  ALKPHOS 54  BILITOT 0.8  PROT 6.1*  ALBUMIN 3.3*   CBC:  Recent Labs Lab 09/06/14 2150  WBC 8.3  NEUTROABS 6.2  HGB 11.0*  HCT 33.7*  MCV 92.1  PLT 182   Cardiac Enzymes:  Recent  Labs Lab 09/06/14 2150 09/07/14 0223 09/07/14 0857  TROPONINI 0.03 0.03 0.03   CBG:  Recent Labs Lab 09/07/14 0152 09/07/14 0740  GLUCAP 141* 141*   Thyroid Function Tests:  Recent Labs Lab 09/07/14 0223  TSH 3.298   Studies/Results: Dg Chest 2 View  09/06/2014   CLINICAL DATA:  Chest pain, shortness of breath and nausea tonight, history hypertension, diabetes mellitus, pulmonary fibrosis, CHF, GERD, COPD  EXAM: CHEST  2 VIEW  COMPARISON:  08/28/2014  FINDINGS: Upper normal heart size.  Normal mediastinal contours and pulmonary vascularity.  Chronic interstitial prominence and peribronchial thickening unchanged.  No acute infiltrate, pleural effusion or pneumothorax.  Bones demineralized.  Dextro convex thoracic scoliosis.  IMPRESSION: Chronic bronchitic and interstitial changes.  No acute abnormalities.   Electronically Signed   By: Lavonia Dana M.D.   On: 09/06/2014 22:04   Medications: I have reviewed the patient's current medications. Scheduled Meds: . aspirin  81 mg Oral Daily  . budesonide-formoterol  2 puff Inhalation BID  . citalopram  40 mg Oral QHS  . Colesevelam HCl  1 packet Oral QPM  . divalproex  1,000 mg Oral QPM  . enoxaparin (LOVENOX) injection  40 mg Subcutaneous Q24H  . fluticasone  2 spray Each Nare Daily  . furosemide  80 mg Oral Daily  . gabapentin  300 mg Oral QHS  . insulin aspart  0-9 Units Subcutaneous TID WC  . insulin detemir  30 Units Subcutaneous Q breakfast  . loratadine  10 mg Oral Daily  . pantoprazole  40 mg Oral BID  . predniSONE  40 mg Oral Q breakfast  . rOPINIRole  3 mg Oral BID  . spironolactone  25 mg Oral BID  . Tiotropium Bromide-Olodaterol  2 puff Inhalation Daily  . traMADol  50 mg Oral Q12H  . [START ON 09/10/2014] Vitamin D (Ergocalciferol)  50,000 Units Oral Q Fri   Continuous Infusions:  PRN Meds:.acetaminophen, albuterol, morphine injection Assessment/Plan:  Chest pain- Concerning for ACS, EKG and Trops Unremarkable.  Risk Factors- HTN, Diabetes, Age. Scheduled for Nuclear stress testing today.  - Awaiting results - Stain prior discharge- at least mod intensity, if CAD then high intensity - Has been started on aspirin  CHF- Records obtained from [place]. Echo- Showed EF- 18%, No diastolic dysfunction, severely dilated left atrium- mild mitral regurg, mild aortic stenosis with assoc mild regurg, mild elevation of pulmonary artery pressure, right ventricle systolic pressure is 49. Pt appears euvolemic. - Will d/c IV lasix, and cont home lasix at 80mg  daily - Cont home spironolactone at 25mg  daily- K- 3.6 - Strict in and out - Daily weights  Sarcoid/PAH: Appears stable. Was on 2L here. Patient followed by Dr. Lamonte Sakai. On 3L  at baseline.  - Cont prednisone 40 mg daily - Symbicort - Albuterol - Tiotropium  DMII: Last A1c 7.8% in May 2016. Home regimen is Levemir 32 units qHS and Humalog 8 units qAC. - Levemir 30 units qHS - SSI  Restless Leg Syndrome: - Ropinirole 3 mg BID - Gabapentin 300 mg qHS - Depakote 1000 mg daily  COPD: Stable, home meds as above - Symbicort - Albuterol - Tiotropium - Claritin  Depression: Citalopram 40 mg  Anemia: Hgb 11. Unclear baseline. CTM  FEN/GI - HH - Protonix  DVT Ppx: Lovenox  Dispo: Disposition is deferred at this time, awaiting stress test.   Bethena Roys, MD 09/07/2014, 11:05 AM

## 2014-09-07 NOTE — H&P (Signed)
Date: 09/07/2014               Patient Name:  Penny Curry MRN: [id number]  DOB: 20-Nov-1940 Age / Sex: 74 y.o., female   PCP: Ronita Hipps, MD         Medical Service: Internal Medicine Teaching Service         Attending Physician: Dr. Bartholomew Crews, MD    First Contact: Dr. Benjamine Mola Pager: [phone]  Second Contact: Dr. Denton Brick Pager: [phone]       After Hours (After 5p/  First Contact Pager: [phone]  weekends / holidays): Second Contact Pager: [phone]   Chief Complaint: SOB  History of Present Illness: Ms. Yebra is a 74 yo female with CHF, sarcoid, PAH, COPD, DMII, HTN, Fe deficiency anemia, and GERD.  She presents with 4 day history of worsening SOB, since discharge from [facility].  She was recently admitted to [facility] for worsening SOB.  During that admission, she was diuresed multiple liters of fluid and increased he prednisone to 20 mg BID.  She had an echo at that time, where they said she has CHF, but she does not remember what kind.  Her daughter reports that her breathing has been getting worse overall for the last 3-4 months.  Since her discharge, she has had 10 lb weight gain, worsening SOB, and feeling clammy.  She endorses eating more salt than she is supposed to do.  She denies medication noncompliance. She chronically sleeps on 3 pillows at night, cannot lie flat, and requires 3L [place] at baseline.  She endorses worsening leg swelling and abdominal swelling, which is where she usually holds her fluid.  Tonight, she also experienced 7/10, nonradiating, sternal/substernal chest pain.  She describes the pain as like she "couldn't straighten up."  The pain came in waves, and she would become diaphoretic, nauseated, and she eventually vomited.  The pain was relieved with Nitro x3 by EMS. She denies chest pain in the ED.  She has no history of ACS.  She endorses night sweats so bad that she has to change her sheets. She denies fever, chills, cough, changes in bowel or  bladder habits, or sick contacts.  Her last BM was yesterday.  She confirmed DNR status on admission.  Meds: Current Facility-Administered Medications  Medication Dose Route Frequency Provider Last Rate Last Dose  . acetaminophen (TYLENOL) tablet 650 mg  650 mg Oral Q4H PRN Jones Bales, MD      . budesonide-formoterol (SYMBICORT) 160-4.5 MCG/ACT inhaler 2 puff  2 puff Inhalation BID Jones Bales, MD      . citalopram (CELEXA) tablet 40 mg  40 mg Oral QHS Bartholomew Crews, MD   40 mg at 09/07/14 5956  . Colesevelam HCl PACK 1 packet  1 packet Oral QPM Jones Bales, MD      . divalproex (DEPAKOTE) DR tablet 1,000 mg  1,000 mg Oral QPM Jones Bales, MD      . enoxaparin (LOVENOX) injection 40 mg  40 mg Subcutaneous Q24H Jones Bales, MD      . fluticasone (FLONASE) 50 MCG/ACT nasal spray 2 spray  2 spray Each Nare Daily Jones Bales, MD      . gabapentin (NEURONTIN) capsule 300 mg  300 mg Oral QHS Jones Bales, MD   300 mg at 09/07/14 0205  . insulin aspart (novoLOG) injection 0-9 Units  0-9 Units Subcutaneous TID WC Jones Bales, MD      .  insulin detemir (LEVEMIR) injection 30 Units  30 Units Subcutaneous Q breakfast Jones Bales, MD      . loratadine (CLARITIN) tablet 10 mg  10 mg Oral Daily Jones Bales, MD      . morphine 2 MG/ML injection 1 mg  1 mg Intravenous Q3H PRN Jones Bales, MD      . pantoprazole (PROTONIX) EC tablet 40 mg  40 mg Oral BID Jones Bales, MD   40 mg at 09/07/14 0205  . spironolactone (ALDACTONE) tablet 25 mg  25 mg Oral BID Jones Bales, MD   25 mg at 09/07/14 0205  . Tiotropium Bromide-Olodaterol 2.5-2.5 MCG/ACT AERS 2 puff  2 puff Inhalation Daily Jones Bales, MD      . Derrill Memo ON 09/10/2014] Vitamin D (Ergocalciferol) (DRISDOL) capsule 50,000 Units  50,000 Units Oral Q Fri Jones Bales, MD        Allergies: Allergies as of 09/06/2014 - Review Complete 09/06/2014  Allergen Reaction Noted  .  Clindamycin/lincomycin Swelling and Rash 05/23/2011  . Penicillins Swelling and Rash   . Tape Itching and Rash 10/27/2012   Past Medical History  Diagnosis Date  . HTN (hypertension)   . Diabetes mellitus   . Fibrosis of lung   . CHF (congestive heart failure)   . GERD (gastroesophageal reflux disease)   . Chronic headache     migraines  . Complication of anesthesia   . PONV (postoperative nausea and vomiting)   . Shortness of breath   . UTI (urinary tract infection)     " MULTIPLE TIMES THIS PAST YEAR "  . Arthritis   . Anemia   . Cataract   . COPD (chronic obstructive pulmonary disease)   . Hyperlipidemia   . Depression   . Osteoporosis    Past Surgical History  Procedure Laterality Date  . Neck surgery  1987  . Partial hysterectomy    . Tubal ligation    . Cataract extraction    . Abdominal hysterectomy     Family History  Problem Relation Age of Onset  . Prostate cancer Brother   . Heart disease Brother   . Stroke Mother   . Diabetes Mother   . Asthma Son   . Emphysema Father   . Heart disease Father   . Lung cancer Sister   . Heart disease Sister   . Heart disease Brother    History   Social History  . Marital Status: Widowed    Spouse Name: N/A  . Number of Children: N/A  . Years of Education: N/A   Occupational History  . disabled    Social History Main Topics  . Smoking status: Former Smoker -- 1.00 packs/day for 8 years    Types: Cigarettes    Quit date: 02/20/1968  . Smokeless tobacco: Never Used  . Alcohol Use: No  . Drug Use: No  . Sexual Activity: Not on file   Other Topics Concern  . Not on file   Social History Narrative    Review of Systems: Pertinent items are noted in HPI.  Physical Exam: Blood pressure 115/58, pulse 78, temperature 98.3 F (36.8 C), temperature source Oral, resp. rate 18, height 5\' 5"  (1.651 m), weight 202 lb 4.8 oz (91.763 kg), SpO2 100 %. Physical Exam  Constitutional: She is oriented to person,  place, and time and well-developed, well-nourished, and in no distress.  HENT:  Head: Normocephalic and atraumatic.  Eyes: EOM are normal.  Neck: Normal range of motion. No tracheal deviation present.  JVD to level of the clavicle with bed at 45 degrees.  Cardiovascular: Normal rate, regular rhythm and intact distal pulses.   Grade II/VI decrescendo murmur heard best at LUSB.  Pulmonary/Chest: Effort normal. No respiratory distress. She has no wheezes.  Crackles at bilateral bases.  Abdominal: Bowel sounds are normal.  Moderately distended, without fluid wave or pitting edema. Nontender to palpation, without guarding or rebound.  Musculoskeletal:  2+ pitting edema to knee bilaterally.  Neurological: She is alert and oriented to person, place, and time.  Skin: Skin is warm and dry.     Lab results: Basic Metabolic Panel:  Recent Labs  09/06/14 2150  NA 137  K 4.2  CL 96*  CO2 28  GLUCOSE 211*  BUN 27*  CREATININE 0.87  CALCIUM 9.1   Liver Function Tests: No results for input(s): AST, ALT, ALKPHOS, BILITOT, PROT, ALBUMIN in the last 72 hours. No results for input(s): LIPASE, AMYLASE in the last 72 hours. No results for input(s): AMMONIA in the last 72 hours. CBC:  Recent Labs  09/06/14 2150  WBC 8.3  NEUTROABS 6.2  HGB 11.0*  HCT 33.7*  MCV 92.1  PLT 182   Cardiac Enzymes:  Recent Labs  09/06/14 2150  TROPONINI 0.03   BNP: No results for input(s): PROBNP in the last 72 hours. D-Dimer: No results for input(s): DDIMER in the last 72 hours. CBG:  Recent Labs  09/07/14 0152  GLUCAP 141*   Hemoglobin A1C: No results for input(s): HGBA1C in the last 72 hours. Fasting Lipid Panel: No results for input(s): CHOL, HDL, LDLCALC, TRIG, CHOLHDL, LDLDIRECT in the last 72 hours. Thyroid Function Tests: No results for input(s): TSH, T4TOTAL, FREET4, T3FREE, THYROIDAB in the last 72 hours. Anemia Panel: No results for input(s): VITAMINB12, FOLATE, FERRITIN,  TIBC, IRON, RETICCTPCT in the last 72 hours. Coagulation: No results for input(s): LABPROT, INR in the last 72 hours. Urine Drug Screen: Drugs of Abuse  No results found for: LABOPIA, COCAINSCRNUR, LABBENZ, AMPHETMU, THCU, LABBARB  Alcohol Level: No results for input(s): ETH in the last 72 hours. Urinalysis: No results for input(s): COLORURINE, LABSPEC, PHURINE, GLUCOSEU, HGBUR, BILIRUBINUR, KETONESUR, PROTEINUR, UROBILINOGEN, NITRITE, LEUKOCYTESUR in the last 72 hours.  Invalid input(s): APPERANCEUR Misc. Labs:  BNP: 127.  Imaging results:  Dg Chest 2 View  09/06/2014   CLINICAL DATA:  Chest pain, shortness of breath and nausea tonight, history hypertension, diabetes mellitus, pulmonary fibrosis, CHF, GERD, COPD  EXAM: CHEST  2 VIEW  COMPARISON:  08/28/2014  FINDINGS: Upper normal heart size.  Normal mediastinal contours and pulmonary vascularity.  Chronic interstitial prominence and peribronchial thickening unchanged.  No acute infiltrate, pleural effusion or pneumothorax.  Bones demineralized.  Dextro convex thoracic scoliosis.  IMPRESSION: Chronic bronchitic and interstitial changes.  No acute abnormalities.   Electronically Signed   By: Lavonia Dana M.D.   On: 09/06/2014 22:04    Other results: EKG: sinus rhythm with PAC, unchanged from previous.  Assessment & Plan by Problem: Principal Problem:   Chest pain Active Problems:   Diabetes mellitus type 2, controlled   Essential hypertension   INTERSTITIAL LUNG DISEASE   PULMONARY SARCOIDOSIS   Pulmonary hypertension   Anemia  Ms. Deshazo is a 74 yo female with CHF, sarcoid, PAH, COPD, DMII, anemia, and GERD.   Acute on chronic CHF exacerbation: Increased leg and abdominal swelling, weight gain, worsening SOB, and chest pain in the setting of dietary indiscretion,  c/w CHF exacerbation.  No cough, systemic signs of infection, or changes on CXR c/f COPD exacerbation.  Patient is ambulatory at home, satting 100% on baseline 3L [place], and  is not tachycardic or tachypnic.  Will trend troponins to r/o ACS.  Will diurese back to dry weight and continue Sarcoid/PAH medications.  Patient's report of bad night sweats is concerning for cancer given her smoking history. Consider CT chest if no improvement with diuresis.   - s/p Lasix 40 mg IV in ED. Will see how patient diureses - Spironolactone 25 mg BID - Strict I/Os - Daily weights - Trend Troponins - ECG in AM - Echo, unless patient's daughter able to provide report - Morphine 2 mg q3hours PRN chest pain  Sarcoid/PAH: Patient followed by Dr. Lamonte Sakai. On 3L [place] at baseline.  Patient has been taking prednisone taper, increased at recent admission from 30 mg daily to 20 mg BID.  She reports she has 10 more days of 20 mg BID before going back to her 30 mg dose.   - Prednisone 40 mg daily - Symbicort - Albuterol - Tiotropium  DMII: Last A1c 7.8% in May 2016. Home regimen is Levemir 32 units qHS and Humalog 8 units qAC. - Levemir 30 units qHS - SSI  Restless Leg Syndrome: - Ropinirole 3 mg BID - Gabapentin 300 mg qHS - Depakote 1000 mg daily  COPD: Stable, home meds as above - Symbicort - Albuterol - Tiotropium - Claritin  Depression: Citalopram 40 mg Anemia: Hgb 11. Unclear baseline. CTM  FEN/GI - HH - Protonix  DVT Ppx: Lovenox  Dispo: Disposition is deferred at this time, awaiting improvement of current medical problems.   The patient does have a current PCP Ronita Hipps, MD) and does need an [facility] hospital follow-up appointment after discharge.  The patient does not have transportation limitations that hinder transportation to clinic appointments.  Signed: Iline Oven, MD, PhD 09/07/2014, 2:22 AM

## 2014-09-07 NOTE — Progress Notes (Signed)
Subjective: Patient denies CP or SOB this morning. No other compliants.   On rounds, patient recounted her episode of chest pain yesterday evening. She states that it was epigastric in location and without radiation. She states that it lasted for a little less than an hour.  It was accompanied by nausea, diaphoresis, and "shakes."  Patient lives alone and does not drive. PCP: [facility] in [place], [place].  Records from recent hospitalization at [facility] (08/28/14 - 09/02/14) obtained and placed in patient's chart: Briefly, patient presented with increasing SOB s/p multiple courses of prednisone and abx as outpatient. Additionally c/o increasing cough and congestion, decreased exercise tolerance and increased WOB. CXR showed pulmonary fibrosis, but no clear infiltrate.  She was started on Rocephin Zithromax and IV Solu-Medrol with improvement of her symptoms.  At time of discharge, she was advised to follow-up with Dr. Malvin Johns (pulmonology) and her PCP within the following week. She was discharged on Zithromax 500mg  PO daily x 3 days, Omnicef 300mg  PO BID x 7 days and prednisone 20mg  BID (ordered to take until f/u with pulmonologist, Dr. Lamonte Sakai).  Patient noted during initial H&P for current admission that she has 10 more days of prednisone 20mg  BID until she returns to 30mg  dose.   She has an appointment scheduled with Dr. Lamonte Sakai on 09/16/14.  Objective: Vital signs in last 24 hours: Filed Vitals:   09/06/14 2330 09/07/14 0141 09/07/14 0500 09/07/14 0803  BP:  115/58 114/64 129/67  Pulse: 83 78 76 80  Temp:  98.3 F (36.8 C) 97.7 F (36.5 C) 98 F (36.7 C)  TempSrc:  Oral  Oral  Resp: 19 18 20 15   Height:  5\' 5"  (1.651 m)    Weight:  91.763 kg (202 lb 4.8 oz)    SpO2: 100% 100% 98% 100%   Weight change:  No intake or output data in the 24 hours ending 09/07/14 0915  General Appearance:  Alert and cooperative with exam, NAD, obese Head:  [place]/AT Neck:  no appreciable JVD; no carotid  bruit Lungs:  Clear to auscultation bilaterally, respirations unlabored Chest Wall:  No tenderness or deformity Heart:  Regular rate and rhythm, S1 and S2 normal, no murmur, rub or gallops Abdomen:  Soft, non-tender, obese, perhaps mildly-distended, +BS Extremities:  1+ pretibial edema Pulses:  2+ DP pulses  Labs: Basic Metabolic Panel:  Recent Labs Lab 09/06/14 2150  NA 137  K 4.2  CL 96*  CO2 28  GLUCOSE 211*  BUN 27*  CREATININE 0.87  CALCIUM 9.1   CBC:  Recent Labs Lab 09/06/14 2150  WBC 8.3  NEUTROABS 6.2  HGB 11.0*  HCT 33.7*  MCV 92.1  PLT 182   Cardiac Enzymes:  Recent Labs Lab 09/06/14 2150 09/07/14 0223  TROPONINI 0.03 0.03   CBG:  Recent Labs Lab 09/07/14 0152 09/07/14 0740  GLUCAP 141* 141*   Thyroid Function Tests:  Recent Labs Lab 09/07/14 0223  TSH 3.298   BNP: 127.2  Other results: EKG (09/06/14 @ 21:21:07): - Sinus rhythm (94 bpm), PAC present - Short PR interval (113); QRS 75; QT/QTc 352/440 - No ST/T wave abnormalities - No significant change from previous tracing (07/04/14)  EKG (09/07/14 @ 02:28:06) - Sinus rhythm (78 bpm) - PR 126; QRS 78, QT/QTc 384/437 - No significant change from earlier study  TTE (07/27/2013):  - Normal biventricular size and function. - Mild aorticregurgitations and stenosis.  - Moderate mitral stenosis.  2D Echocardiogram [facility], 08/29/14): - Normal left ventricular  size, systolic function - high normal EF (estimated >70%), no segmental abnormalities, normal diastolic filling pattern - Mild calcific aortic stenosis with associated mild regurgitation - Mitral annular calcification and leaflet thickening, mild regurgitation, marked left atrial dilatition - Grossly normal RV size/function, dilated right atrium, mild TR, mild elevation of pulmonary artery pressure suggested  Imaging: Dg Chest 2 View  09/06/2014   CLINICAL DATA:  Chest pain, shortness of breath and nausea tonight,  history hypertension, diabetes mellitus, pulmonary fibrosis, CHF, GERD, COPD  EXAM: CHEST  2 VIEW  COMPARISON:  08/28/2014  FINDINGS: Upper normal heart size.  Normal mediastinal contours and pulmonary vascularity.  Chronic interstitial prominence and peribronchial thickening unchanged.  No acute infiltrate, pleural effusion or pneumothorax.  Bones demineralized.  Dextro convex thoracic scoliosis.  IMPRESSION: Chronic bronchitic and interstitial changes.  No acute abnormalities.   Electronically Signed   By: Lavonia Dana M.D.   On: 09/06/2014 22:04   Medications:  Scheduled Medications: . budesonide-formoterol  2 puff Inhalation BID  . citalopram  40 mg Oral QHS  . Colesevelam HCl  1 packet Oral QPM  . divalproex  1,000 mg Oral QPM  . enoxaparin (LOVENOX) injection  40 mg Subcutaneous Q24H  . fluticasone  2 spray Each Nare Daily  . furosemide  20 mg Intravenous Daily  . gabapentin  300 mg Oral QHS  . insulin aspart  0-9 Units Subcutaneous TID WC  . insulin detemir  30 Units Subcutaneous Q breakfast  . loratadine  10 mg Oral Daily  . pantoprazole  40 mg Oral BID  . predniSONE  40 mg Oral Q breakfast  . rOPINIRole  3 mg Oral BID  . spironolactone  25 mg Oral BID  . Tiotropium Bromide-Olodaterol  2 puff Inhalation Daily  . traMADol  50 mg Oral Q12H  . [START ON 09/10/2014] Vitamin D (Ergocalciferol)  50,000 Units Oral Q Fri    PRN Medications: acetaminophen, albuterol, morphine injection    Assessment/Plan: Principal Problem:   Chest pain Active Problems:   Diabetes mellitus type 2, controlled   INTERSTITIAL LUNG DISEASE   PULMONARY SARCOIDOSIS   Pulmonary hypertension   Anemia   QUETZAL MEANY is a 74 y.o. female with a PMHx significant for CHF, sarcoid, PAH, COPD, DMII, anemia, and GERD who presented to [facility] on 09/06/2014  with Chest pain.  #Unstable angina with both typical and atypical features. Troponins negative. Repeat EKG unremarkable.  Concern for ACS low.  - Cardiology  has seen patient today; appreciate recommendations - Stress nuclear study today - Add ASA - Consider addition of statin; patient states she has been on statin in the past (does not recall name), reports no prior adverse reaction - Continue Colesevelam 1 packet daily  - Will add selective BB if stress test positive; close f/u with Dr. Lamonte Sakai to ensure that BB does that worsen pulm status - Records from recent hospitalization of [facility] obtained and placed in patient's chart(including recent echocardiogram)  #Presumed acute on chronic diastolic congestive heart failure.  Patient appears mildy SOB on 2L [place] O2 this morning. She appears euvolemic with only mild pretibial edema on exam this morning. Echo (08/29/14) significant for normal left ventricular size and function (estimated EF >70%), grossly normal RV size/function, dilated right atrium, mild TR, mild elevation of pulmonary artery pressure suggested. - Resume Lasix and spironolactone.   #Sarcoid/PAH. On 3L [place] at baseline.  Patient appears mildly SOB on 2L [place] this morning on rounds. Will continue home meds and  prednisone (per below). Patient to follow-up with Dr. Lamonte Sakai on 09/16/14.  Echo on 08/29/14 suggests "mild elevation of pulmonary artery pressure." - Continue prednisone 40mg  daily; continue 20mg  prednisone BID at discharge - Continue Symbicort - Continue Albuterol - Continue Tiotropium  #COPD. Stable. - Continue home meds as above  #DMII. Last A1c 7.8% in May 2016. Home regimen is Levemir 32 units qHS and Humalog 8 units qAC. - Levemir 30 units qHS - SSI  #RLS.  - Continue ropinerole 3mg  BID - Continue gabapentin 300mg  qHS - Continue Depakote 1000mg  daily  #Depression. - Continue Celexa 40mg  qhs  #FEN/GI/PPx -Diet = HH -GI = protonix -DVT = Lovenox  #Code Status - DNR  #Disposition -Disposition deferred at this time, awaiting improvement of symptoms.  -Anticipated discharge in approximately 0-1 day.  -The  patient does have a current PCP (HOLT,LYNLEY S, MD) and does need an [facility] hospital follow-up appointment after discharge.   -Patient has f/u with Dr. Lamonte Sakai (pulmonogist) scheduled for 09/16/14.  SERVICE NEEDED AT [place]         Y = Yes, Blank = No PT:   OT:   RN:   Equipment:   Other:    Length of Stay:  day(s)  This is a Careers information officer Note.  The care of the patient was discussed with Dr. Denton Brick and the assessment and plan formulated with their assistance.  Please see their attached note for official documentation of the daily encounter.     Quita Skye, Med Student 09/07/2014, 9:15 AM

## 2014-09-07 NOTE — Progress Notes (Signed)
MD notified of pt CBG 400, MD advised to recheck pt CBG at [facility] & follow established parameters. Will continue to monitor closely.

## 2014-09-07 NOTE — Care Management Note (Signed)
Case Management Note  Patient Details  Name: POSIE LILLIBRIDGE MRN: [id number] Date of Birth: May 11, 1940  Subjective/Objective:    Pt admitted for chest pain and SOB. Pt is from home and active with [facility] via [place].                 Action/Plan: CM did call to clarify services being provided. CM will need orders and f47f for HHRN/PT services. CM will fax information to [phone]. Pt has DME. No further needs at this time.    Expected Discharge Date:  09/08/14               Expected Discharge Plan:  [place]  In-House Referral:  NA  Discharge planning Services  CM Consult  Post Acute Care Choice:  Home Health, Resumption of Svcs/PTA Provider Choice offered to:  Patient  DME Arranged:  N/A DME Agency:  NA  HH Arranged:  PT, RN [place] Agency:  [facility]  Status of Service:  Completed, signed off  Medicare Important Message Given:    Date Medicare IM Given:    Medicare IM give by:    Date Additional Medicare IM Given:    Additional Medicare Important Message give by:     If discussed at [place] of Stay Meetings, dates discussed:    Additional Comments:  Bethena Roys, RN 09/07/2014, 4:19 PM

## 2014-09-07 NOTE — Consult Note (Signed)
Primary cardiologist: New  HPI: 74 year old patient with past medical history of COPD on chronic oxygen, sarcoidosis, pulmonary arterial hypertension, diabetes, hypertension for evaluation of chest pain and acute on chronic diastolic congestive heart failure. Echocardiogram in June 2015 showed vigorous LV function. There was mild aortic stenosis with a mean gradient of 12 mmHg and mild aortic insufficiency. There was moderate mitral stenosis and mild mitral regurgitation. Left atrium is mildly dilated. Patient has chronic dyspnea on exertion and orthopnea. She occasionally has pedal edema. She was recently discharged from [facility] following admission for congestive heart failure. No records available. Patient developed epigastric pain last evening. The pain was described as "severe". No radiation. There was associated nausea and diaphoresis but no increased dyspnea. The pain was not pleuritic. Duration approximately 40 minutes. Improved with nitroglycerin. Cardiology asked to evaluate.  Medications Prior to Admission  Medication Sig Dispense Refill  . budesonide-formoterol (SYMBICORT) 160-4.5 MCG/ACT inhaler Inhale 2 puffs into the lungs 2 (two) times daily.    . citalopram (CELEXA) 40 MG tablet Take 40 mg by mouth daily.    . Colesevelam HCl 3.75 G PACK Take 1 packet by mouth every evening.    . divalproex (DEPAKOTE) 500 MG EC tablet Take 1,000 mg by mouth every evening.     . ergocalciferol (VITAMIN D2) 50000 UNITS capsule Take 50,000 Units by mouth every Friday.    . fluticasone (FLONASE) 50 MCG/ACT nasal spray Place 2 sprays into both nostrils daily. 48 g 1  . furosemide (LASIX) 40 MG tablet 2 tabs by mouth once daily    . gabapentin (NEURONTIN) 300 MG capsule Take 300 mg by mouth at bedtime.    Marland Kitchen ibuprofen (ADVIL,MOTRIN) 200 MG tablet Take 400 mg by mouth every 6 (six) hours as needed for moderate pain.    Marland Kitchen insulin aspart (NOVOLOG FLEXPEN) 100 UNIT/ML injection Inject 5 Units into  the skin 3 (three) times daily before meals.     . insulin detemir (LEVEMIR FLEXPEN) 100 UNIT/ML injection Inject 30 Units into the skin daily with breakfast.     . loratadine (CLARITIN) 10 MG tablet Take 1 tablet (10 mg total) by mouth daily. 30 tablet 11  . pantoprazole (PROTONIX) 40 MG tablet Take 40 mg by mouth 2 (two) times daily.     . potassium chloride (KLOR-CON) 10 MEQ CR tablet Take 20 mEq by mouth daily before breakfast.     . predniSONE (DELTASONE) 10 MG tablet Take 3 tablets (30 mg total) by mouth daily with breakfast. 90 tablet 1  . PROAIR HFA 108 (90 BASE) MCG/ACT inhaler Use 2 puffs every 6 hours  as needed for wheezing or  shortness of breath 3 Inhaler 1  . rOPINIRole (REQUIP) 3 MG tablet Take 3 mg by mouth 2 (two) times daily.      Marland Kitchen spironolactone (ALDACTONE) 25 MG tablet Take 1 tablet by mouth 2 (two) times daily.    . Tiotropium Bromide-Olodaterol (STIOLTO RESPIMAT) 2.5-2.5 MCG/ACT AERS Inhale 2 puffs into the lungs daily. 12 g 1  . traMADol (ULTRAM) 50 MG tablet Takes 1 tablet in the morning and 2 tablets at night      Allergies  Allergen Reactions  . Clindamycin/Lincomycin Swelling and Rash  . Penicillins Swelling and Rash     high fever  . Tape Itching and Rash    Paper tape is ok.    Past Medical History  Diagnosis Date  . HTN (hypertension)   . Diabetes mellitus   .  Fibrosis of lung   . CHF (congestive heart failure)   . GERD (gastroesophageal reflux disease)   . Chronic headache     migraines  . Complication of anesthesia   . PONV (postoperative nausea and vomiting)   . Shortness of breath   . UTI (urinary tract infection)     " MULTIPLE TIMES THIS PAST YEAR "  . Arthritis   . Anemia   . Cataract   . COPD (chronic obstructive pulmonary disease)   . Hyperlipidemia   . Depression   . Osteoporosis     Past Surgical History  Procedure Laterality Date  . Neck surgery  1987  . Partial hysterectomy    . Tubal ligation    . Cataract extraction      . Abdominal hysterectomy      History   Social History  . Marital Status: Widowed    Spouse Name: N/A  . Number of Children: N/A  . Years of Education: N/A   Occupational History  . disabled    Social History Main Topics  . Smoking status: Former Smoker -- 1.00 packs/day for 8 years    Types: Cigarettes    Quit date: 02/20/1968  . Smokeless tobacco: Never Used  . Alcohol Use: No  . Drug Use: No  . Sexual Activity: Not on file   Other Topics Concern  . Not on file   Social History Narrative    Family History  Problem Relation Age of Onset  . Prostate cancer Brother   . Heart disease Brother   . Stroke Mother   . Diabetes Mother   . Asthma Son   . Emphysema Father   . Heart disease Father   . Lung cancer Sister   . Heart disease Sister   . Heart disease Brother     ROS:  no fevers or chills, productive cough, hemoptysis, dysphasia, odynophagia, melena, hematochezia, dysuria, hematuria, rash, seizure activity, claudication. Remaining systems are negative.  Physical Exam:   Blood pressure 129/67, pulse 80, temperature 98 F (36.7 C), temperature source Oral, resp. rate 15, height _0  (1.651 m), weight 202 lb 4.8 oz (91.763 kg), SpO2 100 %.  General:  Well developed/obese in NAD Skin warm/dry Patient not depressed No peripheral clubbing Back-normal HEENT-normal/normal eyelids Neck supple/normal carotid upstroke bilaterally; no bruits; no JVD; no thyromegaly chest - CTA/ normal expansion CV - RRR/normal S1 and S2; no murmurs, rubs or gallops;  PMI nondisplaced Abdomen -NT/ND, no HSM, no mass, + bowel sounds, no bruit 2+ femoral pulses, no bruits Ext-no edema, chords, 2+ DP Neuro-grossly nonfocal  ECG sinus rhythm with short PR interval and nonspecific ST changes.  Results for orders placed or performed during the hospital encounter of 09/06/14 (from the past 48 hour(s))  I-stat troponin, ED     Status: None   Collection Time: 09/06/14  9:47 PM   Result Value Ref Range   Troponin i, poc 0.01 0.00 - 0.08 ng/mL   Comment 3            Comment: Due to the release kinetics of cTnI, a negative result within the first hours of the onset of symptoms does not rule out myocardial infarction with certainty. If myocardial infarction is still suspected, repeat the test at appropriate intervals.   CBC with Differential     Status: Abnormal   Collection Time: 09/06/14  9:50 PM  Result Value Ref Range   WBC 8.3 4.0 - 10.5 K/uL   RBC 3.66 (L)  3.87 - 5.11 MIL/uL   Hemoglobin 11.0 (L) 12.0 - 15.0 g/dL   HCT 33.7 (L) 36.0 - 46.0 %   MCV 92.1 78.0 - 100.0 fL   MCH 30.1 26.0 - 34.0 pg   MCHC 32.6 30.0 - 36.0 g/dL   RDW 15.5 11.5 - 15.5 %   Platelets 182 150 - 400 K/uL   Neutrophils Relative % 75 43 - 77 %   Lymphocytes Relative 19 12 - 46 %   Monocytes Relative 6 3 - 12 %   Eosinophils Relative 0 0 - 5 %   Basophils Relative 0 0 - 1 %   Neutro Abs 6.2 1.7 - 7.7 K/uL   Lymphs Abs 1.6 0.7 - 4.0 K/uL   Monocytes Absolute 0.5 0.1 - 1.0 K/uL   Eosinophils Absolute 0.0 0.0 - 0.7 K/uL   Basophils Absolute 0.0 0.0 - 0.1 K/uL   RBC Morphology STOMATOCYTES    WBC Morphology MILD LEFT SHIFT (1-5% METAS, OCC MYELO, OCC BANDS)    Smear Review PLATELETS APPEAR ADEQUATE   Basic metabolic panel     Status: Abnormal   Collection Time: 09/06/14  9:50 PM  Result Value Ref Range   Sodium 137 135 - 145 mmol/L   Potassium 4.2 3.5 - 5.1 mmol/L   Chloride 96 (L) 101 - 111 mmol/L   CO2 28 22 - 32 mmol/L   Glucose, Bld 211 (H) 65 - 99 mg/dL   BUN 27 (H) 6 - 20 mg/dL   Creatinine, Ser 0.87 0.44 - 1.00 mg/dL   Calcium 9.1 8.9 - 10.3 mg/dL   GFR calc non Af Amer >60 >60 mL/min   GFR calc Af Amer >60 >60 mL/min    Comment: (NOTE) The eGFR has been calculated using the CKD EPI equation. This calculation has not been validated in all clinical situations. eGFR's persistently <60 mL/min signify possible Chronic Kidney Disease.    Anion gap 13 5 - 15  Brain  natriuretic peptide     Status: Abnormal   Collection Time: 09/06/14  9:50 PM  Result Value Ref Range   B Natriuretic Peptide 127.2 (H) 0.0 - 100.0 pg/mL  Troponin I     Status: None   Collection Time: 09/06/14  9:50 PM  Result Value Ref Range   Troponin I 0.03 <0.031 ng/mL    Comment:        NO INDICATION OF MYOCARDIAL INJURY.   Glucose, capillary     Status: Abnormal   Collection Time: 09/07/14  1:52 AM  Result Value Ref Range   Glucose-Capillary 141 (H) 65 - 99 mg/dL  TSH     Status: None   Collection Time: 09/07/14  2:23 AM  Result Value Ref Range   TSH 3.298 0.350 - 4.500 uIU/mL  Troponin I-serum (0, 3, 6 hours)     Status: None   Collection Time: 09/07/14  2:23 AM  Result Value Ref Range   Troponin I 0.03 <0.031 ng/mL    Comment:        NO INDICATION OF MYOCARDIAL INJURY.   Glucose, capillary     Status: Abnormal   Collection Time: 09/07/14  7:40 AM  Result Value Ref Range   Glucose-Capillary 141 (H) 65 - 99 mg/dL   Comment 1 Notify RN    Comment 2 Document in Chart     Dg Chest 2 View  09/06/2014   CLINICAL DATA:  Chest pain, shortness of breath and nausea tonight, history hypertension, diabetes mellitus, pulmonary fibrosis,  CHF, GERD, COPD  EXAM: CHEST  2 VIEW  COMPARISON:  08/28/2014  FINDINGS: Upper normal heart size.  Normal mediastinal contours and pulmonary vascularity.  Chronic interstitial prominence and peribronchial thickening unchanged.  No acute infiltrate, pleural effusion or pneumothorax.  Bones demineralized.  Dextro convex thoracic scoliosis.  IMPRESSION: Chronic bronchitic and interstitial changes.  No acute abnormalities.   Electronically Signed   By: Lavonia Dana M.D.   On: 09/06/2014 22:04    Assessment/Plan 1 chest pain-symptoms with both typical and atypical features. Enzymes are negative. Electrocardiogram unremarkable. Plan risk stratification with stress nuclear study. Would add aspirin. She would benefit from a statin long-term given history of  diabetes mellitus. 2 acute on chronic diastolic congestive heart failure-patient is not significantly volume overloaded on examination. Would resume preadmission dose of Lasix and spironolactone. Obtain records concerning recent echocardiogram at [facility]. 3 diabetes mellitus-management per primary care. 4 sarcoid with interstitial lung disease-continue blocker dilators and home oxygen.  Kirk Ruths MD 09/07/2014, 8:52 AM  c

## 2014-09-07 NOTE — Evaluation (Signed)
Physical Therapy Evaluation Patient Details Name: Penny Curry MRN: [id number] DOB: 13-Sep-1940 Today's Date: 09/07/2014   History of Present Illness  74 y.o. female admitted to [facility] on 09/06/14 for chest pain, N/V.  Cardiac workup in progress.  Pt with significant PMHx of HTN, DM, fibrosis of the lung, CHF, COPD, and neck surgery.  Pt is on 3 L O2 [place] at home.    Clinical Impression  Pt was able to walk the unit with stable O2 and HR (O2 mid to high 90s on 3 L O2 [place]- which is what she uses at home), but is mildly unsteady on her feet which presents as a fall risk.  She would benefit from HHPT at discharge for gait training and exercises to improve her strength and balance while monitoring her cardiopulmonary response.   PT to follow acutely for deficits listed below.       Follow Up Recommendations Home health PT;Supervision - Intermittent    Equipment Recommendations  None recommended by PT    Recommendations for Other Services   NA    Precautions / Restrictions Precautions Precautions: Fall Precaution Comments: pt is mildly unsteady on her feet      Mobility  Bed Mobility Overal bed mobility: Modified Independent             General bed mobility comments: HOB levated and reliance on railing for leverage  Transfers Overall transfer level: Needs assistance Equipment used: None Transfers: Sit to/from Stand Sit to Stand: Min guard         General transfer comment: Min guard assist to steady pt during transition, pt also reaching for O2 tank for support.   Ambulation/Gait Ambulation/Gait assistance: Min assist Ambulation Distance (Feet): 150 Feet Assistive device: None Gait Pattern/deviations: Step-through pattern;Staggering left;Staggering right Gait velocity: decreased Gait velocity interpretation: Below normal speed for age/gender General Gait Details: pt with mildly unsteady gait pattern, staggering left and right.  At times she reaches for external support from  furniture and hallway railing.  Min assist to help compensate for balance.          Balance Overall balance assessment: Needs assistance Sitting-balance support: Feet supported;No upper extremity supported       Standing balance support: Single extremity supported Standing balance-Leahy Scale: Fair                               Pertinent Vitals/Pain Pain Assessment: No/denies pain    Home Living Family/patient expects to be discharged to:: Private residence Living Arrangements: Alone Available Help at Discharge: Family;Available PRN/intermittently Type of Home: House Home Access: Stairs to enter Entrance Stairs-Rails: Left;Right;Can reach both Entrance Stairs-Number of Steps: 4 Home Layout: One level Home Equipment: Kasandra Knudsen - single point      Prior Function Level of Independence: Independent with assistive device(s)         Comments: per pt report she was just told to use a cane after her discharge Thursday 09/01/13 from [facility].          Extremity/Trunk Assessment   Upper Extremity Assessment: Overall [facility] for tasks assessed           Lower Extremity Assessment: Generalized weakness      Cervical / Trunk Assessment: Other exceptions  Communication   Communication: No difficulties  Cognition Arousal/Alertness: Awake/alert Behavior During Therapy: WFL for tasks assessed/performed Overall Cognitive Status: Within Functional Limits for tasks assessed  Assessment/Plan    PT Assessment Patient needs continued PT services  PT Diagnosis Difficulty walking;Generalized weakness;Abnormality of gait   PT Problem List Decreased strength;Decreased activity tolerance;Decreased balance;Decreased mobility;Cardiopulmonary status limiting activity  PT Treatment Interventions DME instruction;Gait training;Stair training;Functional mobility training;Therapeutic activities;Therapeutic exercise;Balance  training;Neuromuscular re-education;Patient/family education   PT Goals (Current goals can be found in the Care Plan section) Acute Rehab PT Goals Patient Stated Goal: to go home PT Goal Formulation: With patient/family Time For Goal Achievement: 09/21/14 Potential to Achieve Goals: Good    Frequency Min 3X/week    End of Session Equipment Utilized During Treatment: Gait belt;Oxygen Activity Tolerance: Patient limited by fatigue Patient left: in bed;with call bell/phone within reach;with family/visitor present      Functional Assessment Tool Used: assist level Functional Limitation: Mobility: Walking and moving around Mobility: Walking and Moving Around Current Status (X5217): At least 20 percent but less than 40 percent impaired, limited or restricted Mobility: Walking and Moving Around Goal Status [phone]): At least 1 percent but less than 20 percent impaired, limited or restricted    Time: 5396-7289 PT Time Calculation (min) (ACUTE ONLY): 27 min   Charges:   PT Evaluation $Initial PT Evaluation Tier I: 1 Procedure PT Treatments $Gait Training: 8-22 mins   PT G Codes:   PT G-Codes **NOT FOR INPATIENT CLASS** Functional Assessment Tool Used: assist level Functional Limitation: Mobility: Walking and moving around Mobility: Walking and Moving Around Current Status (T9150): At least 20 percent but less than 40 percent impaired, limited or restricted Mobility: Walking and Moving Around Goal Status [phone]): At least 1 percent but less than 20 percent impaired, limited or restricted    Tashawn Greff B. Quentin Shorey, PT, DPT [phone]   09/07/2014, 3:01 PM

## 2014-09-07 NOTE — Discharge Instructions (Addendum)

## 2014-09-07 NOTE — Progress Notes (Signed)
CBP rechecked 1 hr after receiving 15 units of Novolog. CBG 475, will recheck in 1 hr

## 2014-09-08 DIAGNOSIS — D869 Sarcoidosis, unspecified: Secondary | ICD-10-CM

## 2014-09-08 DIAGNOSIS — R0789 Other chest pain: Secondary | ICD-10-CM

## 2014-09-08 DIAGNOSIS — E119 Type 2 diabetes mellitus without complications: Secondary | ICD-10-CM | POA: Diagnosis not present

## 2014-09-08 DIAGNOSIS — I200 Unstable angina: Secondary | ICD-10-CM | POA: Diagnosis not present

## 2014-09-08 DIAGNOSIS — I10 Essential (primary) hypertension: Secondary | ICD-10-CM | POA: Diagnosis not present

## 2014-09-08 DIAGNOSIS — J449 Chronic obstructive pulmonary disease, unspecified: Secondary | ICD-10-CM | POA: Diagnosis not present

## 2014-09-08 DIAGNOSIS — R079 Chest pain, unspecified: Secondary | ICD-10-CM | POA: Diagnosis not present

## 2014-09-08 LAB — GLUCOSE, CAPILLARY
GLUCOSE-CAPILLARY: 303 mg/dL — AB (ref 65–99)
Glucose-Capillary: 158 mg/dL — ABNORMAL HIGH (ref 65–99)

## 2014-09-08 MED ORDER — ALBUTEROL SULFATE (2.5 MG/3ML) 0.083% IN NEBU: 2.5000 mg | INHALATION_SOLUTION | Freq: Two times a day (BID) | RESPIRATORY_TRACT | Status: DC

## 2014-09-08 NOTE — Progress Notes (Signed)
  Date: 09/08/2014  Patient name: SANIKA BROSIOUS  Medical record number: [id number]  Date of birth: 23-Aug-1940   This patient has been seen and the plan of care was discussed with the house staff. Please see their note for complete details. I concur with their findings with the following additions/corrections: Ms Hendricksen's stress test was low risk and without ischemia. She was planned to be D/C yesterday evening but her CBG was too high. This AM, it is 158. She has had no further CP, dyspnea is at baseline, and is stable for D/C. Has appt tomorrow with PCP. New meds are ASA 81. PCP to discuss statin.   Bartholomew Crews, MD 09/08/2014, 11:21 AM

## 2014-09-08 NOTE — Progress Notes (Signed)
Subjective: Patient denies chest pain or shortness of breath. Breathing comfortably on 3L O2. Patient states that she feels abdominal swelling is reduced.   Objective: Vital signs in last 24 hours: Filed Vitals:   09/07/14 2024 09/08/14 0248 09/08/14 0500 09/08/14 0729  BP: 122/58  135/61   Pulse: 88 89 83   Temp: 98.2 F (36.8 C)  98.1 F (36.7 C)   TempSrc: Oral  Oral   Resp: 18 18 16    Height:      Weight:   94.031 kg (207 lb 4.8 oz)   SpO2: 98% 99% 100% 99%   Weight change: -1.225 kg (-2 lb 11.2 oz)  Intake/Output Summary (Last 24 hours) at 09/08/14 1138 Last data filed at 09/08/14 0800  Gross per 24 hour  Intake   1240 ml  Output      0 ml  Net   1240 ml   General Appearance: Alert and cooperative with exam, NAD, obese Head: [place]/AT Neck: no appreciable JVD Lungs: Clear to auscultation bilaterally, respirations unlabored Heart: Regular rate and rhythm, S1 and S2 normal, no murmur, rub or gallops Abdomen: Soft, non-tender, obese, perhaps mildly-distended, +BS Extremities: trace pretibial edema bilaterally, no distal erythema Pulses: 2+ DP pulses  Labs: Basic Metabolic Panel:  Recent Labs Lab 09/06/14 2150 09/07/14 0857  NA 137 137  K 4.2 3.6  CL 96* 94*  CO2 28 36*  GLUCOSE 211* 138*  BUN 27* 24*  CREATININE 0.87 0.91  CALCIUM 9.1 8.8*  CBG:  Recent Labs Lab 09/07/14 1303 09/07/14 1632 09/07/14 1757 09/07/14 1856 09/07/14 2056 09/08/14 0733  GLUCAP 306* 492* 475* 400* 307* 158*    Imaging: Dg Chest 2 View  09/06/2014   CLINICAL DATA:  Chest pain, shortness of breath and nausea tonight, history hypertension, diabetes mellitus, pulmonary fibrosis, CHF, GERD, COPD  EXAM: CHEST  2 VIEW  COMPARISON:  08/28/2014  FINDINGS: Upper normal heart size.  Normal mediastinal contours and pulmonary vascularity.  Chronic interstitial prominence and peribronchial thickening unchanged.  No acute infiltrate, pleural effusion or pneumothorax.  Bones  demineralized.  Dextro convex thoracic scoliosis.  IMPRESSION: Chronic bronchitic and interstitial changes.  No acute abnormalities.   Electronically Signed   By: Lavonia Dana M.D.   On: 09/06/2014 22:04   Nm Myocar Multi W/spect W/wall Motion / Ef  09/07/2014   CLINICAL DATA:  Chest pain  EXAM: MYOCARDIAL IMAGING WITH SPECT (REST AND PHARMACOLOGIC-STRESS)  GATED LEFT VENTRICULAR WALL MOTION STUDY  LEFT VENTRICULAR EJECTION FRACTION  TECHNIQUE: Standard myocardial SPECT imaging was performed after resting intravenous injection of 10 mCi Tc-73m sestamibi. Subsequently, intravenous infusion of Lexiscan was performed under the supervision of the Cardiology staff. At peak effect of the drug, 30 mCi Tc-6m sestamibi was injected intravenously and standard myocardial SPECT imaging was performed. Quantitative gated imaging was also performed to evaluate left ventricular wall motion, and estimate left ventricular ejection fraction.  COMPARISON:  None.  FINDINGS: Perfusion: Allowing for breast attenuation artifact, there are no true perfusion defects and there is no evidence of stress-induced ischemia.  Wall Motion: Normal left ventricular wall motion. No left ventricular dilation.  Left Ventricular Ejection Fraction: 66 %  End diastolic volume 74 ml  End systolic volume 25 ml  IMPRESSION: 1. No reversible ischemia or infarction.  2. Normal left ventricular wall motion.  3. Left ventricular ejection fraction 66%  4. Low-risk stress test findings*.  *2012 Appropriate Use Criteria for Coronary Revascularization Focused Update: J Am Coll Cardiol. 4431;54(0):086-761. http://content.airportbarriers.com.aspx?articleid=1201161  Electronically Signed   By: Marybelle Killings M.D.   On: 09/07/2014 16:13    Medications:  Scheduled Medications: . albuterol  2.5 mg Nebulization BID  . aspirin  81 mg Oral Daily  . citalopram  40 mg Oral QHS  . Colesevelam HCl  1 packet Oral QPM  . divalproex  1,000 mg Oral QPM  . enoxaparin  (LOVENOX) injection  40 mg Subcutaneous Q24H  . fluticasone  2 spray Each Nare Daily  . furosemide  80 mg Oral Daily  . gabapentin  300 mg Oral QHS  . insulin aspart  0-15 Units Subcutaneous TID WC  . insulin aspart  0-5 Units Subcutaneous QHS  . insulin aspart  5 Units Subcutaneous TID WC  . insulin detemir  30 Units Subcutaneous Q breakfast  . loratadine  10 mg Oral Daily  . pantoprazole  40 mg Oral BID  . predniSONE  40 mg Oral Q breakfast  . rOPINIRole  3 mg Oral BID  . spironolactone  25 mg Oral BID  . Tiotropium Bromide-Olodaterol  2 puff Inhalation Daily  . traMADol  50 mg Oral Q12H  . [START ON 09/10/2014] Vitamin D (Ergocalciferol)  50,000 Units Oral Q Fri    PRN Medications: acetaminophen, albuterol, morphine injection    Assessment/Plan: Principal Problem:   Chest pain Active Problems:   Diabetes mellitus type 2, controlled   INTERSTITIAL LUNG DISEASE   PULMONARY SARCOIDOSIS   Pulmonary hypertension   Anemia   Chronic respiratory failure with hypoxia   Unstable angina   #Unstable angina with both typical and atypical features. Troponins negative. Repeat EKG unremarkable.SPECT showed overall low-risk stress test findings  - Continue ASA as outpatient - Consider addition of statin; patient states she has been on statin in the past (does not recall name), reports no prior adverse reaction - Continue Colesevelam 1 packet daily   #Presumed acute on chronic diastolic congestive heart failure. Breathing comfortably on RA this morning. On exam, euvolemic with only mild pretibial edema on exam this morning, no lung crackles. Echo (08/29/14) significant for normal left ventricular size and function (estimated EF >70%), grossly normal RV size/function, dilated right atrium, mild TR, mild elevation of pulmonary artery pressure suggested. - Continue Lasix and spironolactone.   #Pulmonary sarcoidosis/chronic O2 dependent respiratory failure. On 3L [place] at baseline; breathing  comfortably on 3L  this morning. Will continue home meds and prednisone (per below). Patient to follow-up with Dr. Lamonte Sakai on 09/16/14.  - Continue prednisone 40mg  qam; restart 20mg  prednisone BID at discharge - patient to f/u with Dr. Lamonte Sakai on 09/16/14 - Continue albuterol nebs PRN - Continue tiotropium bromide-olodaterol 2 puffs daily  #DMII.  Patient had elevated CBG to 492 yesterday  And was given 15U Novolog and started on 5U Novolog qAC. CBG this morning was 158. Patient advised to follow her gluocose and weight closely while on daily prednisone. - Levemir 30 units qHS + 5 units Novolog qAC - moderate SSI - At discharge, restart home insulin regimen: Levemir 32 units qHS and Humalog 8 units qAC.  #RLS.  - Continue ropinerole 3mg  BID - Continue gabapentin 300mg  qHS - Continue Depakote 1000mg  daily  #Depression. - Continue Celexa 40mg  qhs; PCP to consider decreasing Celexa to 20mg  qhs given risk for increased QTc in patient >36 yo.  #FEN/GI/PPx - Diet = HH - GI = protonix - DVT = Lovenox  #Code Status - DNR  #Disposition - Anticipated discharge today.   - The patient does have a current  PCP (HOLT,LYNLEY S, MD) and does need an [facility] hospital follow-up appointment after discharge.  Appointment scheduled for 09/09/2014.  Patient has appt with Dr. Lamonte Sakai on 09/16/14.  SERVICE NEEDED AT [place]         Y = Yes, Blank = No PT:   OT:   RN:   Equipment:   Other:    Length of Stay:  day(s)  This is a Careers information officer Note.  The care of the patient was discussed with Dr. Denton Brick and the assessment and plan formulated with their assistance.  Please see their attached note for official documentation of the daily encounter.     Quita Skye, Med Student 09/08/2014, 11:38 AM

## 2014-09-08 NOTE — Progress Notes (Signed)
Discharge teaching and instructions reviewed with pt. VSS. Pt discharging home with son.

## 2014-09-08 NOTE — Progress Notes (Signed)
Subjective: No complaints today. Patient appears to be at baseline. She was on  Her home 3L of O2, and saturating 100% on room air. No chest pain.   Objective: Vital signs in last 24 hours: Filed Vitals:   09/07/14 2024 09/08/14 0248 09/08/14 0500 09/08/14 0729  BP: 122/58  135/61   Pulse: 88 89 83   Temp: 98.2 F (36.8 C)  98.1 F (36.7 C)   TempSrc: Oral  Oral   Resp: 18 18 16    Height:      Weight:   207 lb 4.8 oz (94.031 kg)   SpO2: 98% 99% 100% 99%   Weight change: -2 lb 11.2 oz (-1.225 kg)  Intake/Output Summary (Last 24 hours) at 09/08/14 1111 Last data filed at 09/08/14 0800  Gross per 24 hour  Intake   1240 ml  Output      0 ml  Net   1240 ml   General appearance: alert, cooperative, appears as stated age and in no distress Lungs: clear to auscultation bilaterally Heart: regular rate and rhythm, S1, S2 normal, no murmur, click, rub or gallop Abdomen: soft, full, but not tense, does not appear obviously/markedly distended- may be normal for patient, non-tender; bowel sounds normal Extremities: No pitting edema today, slight bilat redness likely from chronic stasis, improved from yesterday Pulses: 2+ and symmetric  Lab Results: Basic Metabolic Panel:  Recent Labs Lab 09/06/14 2150 09/07/14 0857  NA 137 137  K 4.2 3.6  CL 96* 94*  CO2 28 36*  GLUCOSE 211* 138*  BUN 27* 24*  CREATININE 0.87 0.91  CALCIUM 9.1 8.8*   Liver Function Tests:  Recent Labs Lab 09/07/14 0857  AST 28  ALT 54  ALKPHOS 54  BILITOT 0.8  PROT 6.1*  ALBUMIN 3.3*   CBC:  Recent Labs Lab 09/06/14 2150  WBC 8.3  NEUTROABS 6.2  HGB 11.0*  HCT 33.7*  MCV 92.1  PLT 182   Cardiac Enzymes:  Recent Labs Lab 09/07/14 0223 09/07/14 0857 09/07/14 1716  TROPONINI 0.03 0.03 <0.03   CBG:  Recent Labs Lab 09/07/14 1303 09/07/14 1632 09/07/14 1757 09/07/14 1856 09/07/14 2056 09/08/14 0733  GLUCAP 306* 492* 475* 400* 307* 158*   Thyroid Function Tests:  Recent  Labs Lab 09/07/14 0223  TSH 3.298   Studies/Results: Dg Chest 2 View  09/06/2014   CLINICAL DATA:  Chest pain, shortness of breath and nausea tonight, history hypertension, diabetes mellitus, pulmonary fibrosis, CHF, GERD, COPD  EXAM: CHEST  2 VIEW  COMPARISON:  08/28/2014  FINDINGS: Upper normal heart size.  Normal mediastinal contours and pulmonary vascularity.  Chronic interstitial prominence and peribronchial thickening unchanged.  No acute infiltrate, pleural effusion or pneumothorax.  Bones demineralized.  Dextro convex thoracic scoliosis.  IMPRESSION: Chronic bronchitic and interstitial changes.  No acute abnormalities.   Electronically Signed   By: Lavonia Dana M.D.   On: 09/06/2014 22:04   Nm Myocar Multi W/spect W/wall Motion / Ef  09/07/2014   CLINICAL DATA:  Chest pain  EXAM: MYOCARDIAL IMAGING WITH SPECT (REST AND PHARMACOLOGIC-STRESS)  GATED LEFT VENTRICULAR WALL MOTION STUDY  LEFT VENTRICULAR EJECTION FRACTION  TECHNIQUE: Standard myocardial SPECT imaging was performed after resting intravenous injection of 10 mCi Tc-68m sestamibi. Subsequently, intravenous infusion of Lexiscan was performed under the supervision of the Cardiology staff. At peak effect of the drug, 30 mCi Tc-69m sestamibi was injected intravenously and standard myocardial SPECT imaging was performed. Quantitative gated imaging was also performed to evaluate left  ventricular wall motion, and estimate left ventricular ejection fraction.  COMPARISON:  None.  FINDINGS: Perfusion: Allowing for breast attenuation artifact, there are no true perfusion defects and there is no evidence of stress-induced ischemia.  Wall Motion: Normal left ventricular wall motion. No left ventricular dilation.  Left Ventricular Ejection Fraction: 66 %  End diastolic volume 74 ml  End systolic volume 25 ml  IMPRESSION: 1. No reversible ischemia or infarction.  2. Normal left ventricular wall motion.  3. Left ventricular ejection fraction 66%  4.  Low-risk stress test findings*.  *2012 Appropriate Use Criteria for Coronary Revascularization Focused Update: J Am Coll Cardiol. 2585;27(7):824-235. http://content.airportbarriers.com.aspx?articleid=1201161   Electronically Signed   By: Marybelle Killings M.D.   On: 09/07/2014 16:13   Medications: I have reviewed the patient's current medications. Scheduled Meds: . albuterol  2.5 mg Nebulization BID  . aspirin  81 mg Oral Daily  . citalopram  40 mg Oral QHS  . Colesevelam HCl  1 packet Oral QPM  . divalproex  1,000 mg Oral QPM  . enoxaparin (LOVENOX) injection  40 mg Subcutaneous Q24H  . fluticasone  2 spray Each Nare Daily  . furosemide  80 mg Oral Daily  . gabapentin  300 mg Oral QHS  . insulin aspart  0-15 Units Subcutaneous TID WC  . insulin aspart  0-5 Units Subcutaneous QHS  . insulin aspart  5 Units Subcutaneous TID WC  . insulin detemir  30 Units Subcutaneous Q breakfast  . loratadine  10 mg Oral Daily  . pantoprazole  40 mg Oral BID  . predniSONE  40 mg Oral Q breakfast  . rOPINIRole  3 mg Oral BID  . spironolactone  25 mg Oral BID  . Tiotropium Bromide-Olodaterol  2 puff Inhalation Daily  . traMADol  50 mg Oral Q12H  . [START ON 09/10/2014] Vitamin D (Ergocalciferol)  50,000 Units Oral Q Fri   Continuous Infusions:  PRN Meds:.acetaminophen, albuterol, morphine injection Assessment/Plan:  Chest pain- Myoview- low risk stress test, EF- 65% No chest pains. - Discharge home today to follow up with PCP - Not on statin, apparently has tried some statins in the past which she did not tolerate, PCP to follow up. - Cont aspirin  CHF- Records obtained from [facility]. Echo- Showed EF- 36%, No diastolic dysfunction, severely dilated left atrium- mild mitral regurg, mild aortic stenosis with assoc mild regurg, mild elevation of pulmonary artery pressure, right ventricle systolic pressure is 49. Pt appears euvolemic. -  cont home lasix at 80mg  daily - Cont home spironolactone at 25mg   daily- K- 3.6 - Strict in and out - Daily weights - Home health Pt and RN  Sarcoid/PAH: Appears stable. Was on 2L here. Patient followed by Dr. Lamonte Sakai. On 3L [place] at baseline.  - Cont prednisone 40 mg daily - Symbicort - Albuterol - Tiotropium  DMII: Last A1c 7.8% in May 2016. Home regimen is Levemir 32 units qHS and Humalog 8 units qAC. - Cont home meds.  Restless Leg Syndrome: - Ropinirole 3 mg BID - Gabapentin 300 mg qHS - Depakote 1000 mg daily  COPD: Stable, home meds as above - Symbicort - Albuterol - Tiotropium - Claritin - F/u with Pulmonologist  Depression: Citalopram 40 mg  Anemia: Hgb 11. Unclear baseline. CTM  FEN/GI - HH - Protonix  DVT Ppx: Lovenox  Dispo: D/c home today.   Penny Roys, MD 09/08/2014, 11:11 AM

## 2014-09-08 NOTE — Progress Notes (Signed)
Negative nuclear stress test for ischemia yesterday. Normal LV function. No further cardiac suggestions at this time. Cardiology will sign-off.  Pixie Casino, MD, [facility] Attending Cardiologist [place]

## 2014-09-08 NOTE — Discharge Summary (Signed)
Name: RAYLAN TROIANI MRN: [id number] DOB: 10/08/1940 74 y.o. PCP: Ronita Hipps, MD  Date of Admission: 09/06/2014  9:10 PM Date of Discharge: 09/08/2014 Attending Physician: Bartholomew Crews, MD  Discharge Diagnosis:  Principal Problem:   Chest pain Active Problems:   Diabetes mellitus type 2, controlled   INTERSTITIAL LUNG DISEASE   PULMONARY SARCOIDOSIS   Pulmonary hypertension   Anemia   Chronic respiratory failure with hypoxia   Unstable angina  Discharge Medications:   Medication List    TAKE these medications        aspirin 81 MG chewable tablet  Chew 1 tablet (81 mg total) by mouth daily.     cefdinir 300 MG capsule  Commonly known as:  OMNICEF  Take 300 mg by mouth 2 (two) times daily.     citalopram 40 MG tablet  Commonly known as:  CELEXA  Take 40 mg by mouth daily.     Colesevelam HCl 3.75 G Pack  Take 1 packet by mouth every evening.     divalproex 500 MG DR tablet  Commonly known as:  DEPAKOTE  Take 1,000 mg by mouth every evening.     ergocalciferol 50000 UNITS capsule  Commonly known as:  VITAMIN D2  Take 50,000 Units by mouth every Friday.     fluticasone 50 MCG/ACT nasal spray  Commonly known as:  FLONASE  Place 2 sprays into both nostrils daily.     furosemide 40 MG tablet  Commonly known as:  LASIX  2 tabs by mouth once daily     gabapentin 300 MG capsule  Commonly known as:  NEURONTIN  Take 300 mg by mouth at bedtime.     HUMALOG KWIKPEN 100 UNIT/ML KiwkPen  Generic drug:  insulin lispro  Inject 8 Units into the skin 3 (three) times daily.     ibuprofen 200 MG tablet  Commonly known as:  ADVIL,MOTRIN  Take 400 mg by mouth every 6 (six) hours as needed for moderate pain.     LEVEMIR FLEXTOUCH 100 UNIT/ML Pen  Generic drug:  Insulin Detemir  Inject 32 Units into the skin daily with breakfast.     loratadine 10 MG tablet  Commonly known as:  CLARITIN  Take 1 tablet (10 mg total) by mouth daily.     pantoprazole 40 MG  tablet  Commonly known as:  PROTONIX  Take 40 mg by mouth daily.     potassium chloride 10 MEQ CR tablet  Commonly known as:  KLOR-CON  Take 20 mEq by mouth daily before breakfast.     predniSONE 20 MG tablet  Commonly known as:  DELTASONE  Take 40 mg by mouth daily.     PROAIR HFA 108 (90 BASE) MCG/ACT inhaler  Generic drug:  albuterol  Use 2 puffs every 6 hours  as needed for wheezing or  shortness of breath     rOPINIRole 3 MG tablet  Commonly known as:  REQUIP  Take 3 mg by mouth 2 (two) times daily.     spironolactone 25 MG tablet  Commonly known as:  ALDACTONE  Take 1 tablet by mouth 2 (two) times daily.     tetrahydrozoline-zinc 0.05-0.25 % ophthalmic solution  Commonly known as:  VISINE-AC  Place 1 drop into both eyes daily as needed (dry eyes).     Tiotropium Bromide-Olodaterol 2.5-2.5 MCG/ACT Aers  Commonly known as:  STIOLTO RESPIMAT  Inhale 2 puffs into the lungs daily.     traMADol 50 MG  tablet  Commonly known as:  ULTRAM  Takes 1 tablet in the morning and 2 tablets at night        Disposition and follow-up:   Ms.Selda M Blondin was discharged from [facility] in Good condition.  At the hospital follow up visit please address:  1. Continued resolution of chest pain; resolution of acutely increased shortness of breath (which required hospitalization at [facility] from 08/28/14 to 09/02/14 and now on prednisone 20mg  BID); DM management, especially in setting of daily prednisone; possible addition of moderate to high intensity statin   2. Labs / imaging needed at time of follow-up: none   3. Pending labs/ test needing follow-up: none    Follow-up Appointments:     Follow-up Information    Follow up with Ronita Hipps, MD On 09/09/2014.   Specialty:  Family Medicine   Why:  at 4:30pm   Contact information:   [place] [phone]       Follow up with Collene Gobble., MD On 09/16/2014.   Specialty:   Pulmonary Disease   Why:  at 4:30   Contact information:   [address] [address] [phone]       Follow up with [facility]   Why:  Registered Nurse and Physical Therapy   Contact information:   [address] [place] [address] [phone]       Discharge Instructions: Discharge Instructions    Call MD for:  difficulty breathing, headache or visual disturbances    Complete by:  As directed      Diet - low sodium heart healthy    Complete by:  As directed      Diet - low sodium heart healthy    Complete by:  As directed      Discharge instructions    Complete by:  As directed   Please follow up with your primary care doctor tomorrow. Please check your blood sugars at least three times a day. Take your glucometer along for your Clinic visit.   We have added Aspirin 81mg , take one tab once a day.  Also please follow up with your pulmonologist. We have not made any other changes to your medications.     Face-to-face encounter (required for Medicare/Medicaid patients)    Complete by:  As directed   I Jensen Cheramie certify that this patient is under my care and that I, or a nurse practitioner or physician's assistant working with me, had a face-to-face encounter that meets the physician face-to-face encounter requirements with this patient on 09/08/2014. The encounter with the patient was in whole, or in part for the following medical condition(s) which is the primary reason for home health care (List medical condition): Dyspnea due to Pulmonary Sarcoidosis and Pulmonary Hypertension.  The encounter with the patient was in whole, or in part, for the following medical condition, which is the primary reason for home health care:  Pulmonary Sarcoidosis with pulmonary hypertension  I certify that, based on my findings, the following services are medically necessary home health services:   Nursing Physical  therapy    Reason for Medically Necessary Home Health Services:  Skilled Nursing- Changes in Medication/Medication Management  My clinical findings support the need for the above services:  Pain interferes with ambulation/mobility  Further, I certify that my clinical findings support that this patient is homebound due to:  Shortness of Breath  with activity     Home Health    Complete by:  As directed   To provide the following care/treatments:   PT RN       Increase activity slowly    Complete by:  As directed      Increase activity slowly    Complete by:  As directed            Consultations:  Cardiology.  Procedures Performed:  Dg Chest 2 View  09/06/2014   CLINICAL DATA:  Chest pain, shortness of breath and nausea tonight, history hypertension, diabetes mellitus, pulmonary fibrosis, CHF, GERD, COPD  EXAM: CHEST  2 VIEW  COMPARISON:  08/28/2014  FINDINGS: Upper normal heart size.  Normal mediastinal contours and pulmonary vascularity.  Chronic interstitial prominence and peribronchial thickening unchanged.  No acute infiltrate, pleural effusion or pneumothorax.  Bones demineralized.  Dextro convex thoracic scoliosis.  IMPRESSION: Chronic bronchitic and interstitial changes.  No acute abnormalities.   Electronically Signed   By: Lavonia Dana M.D.   On: 09/06/2014 22:04   Nm Myocar Multi W/spect W/wall Motion / Ef  09/07/2014   CLINICAL DATA:  Chest pain  EXAM: MYOCARDIAL IMAGING WITH SPECT (REST AND PHARMACOLOGIC-STRESS)  GATED LEFT VENTRICULAR WALL MOTION STUDY  LEFT VENTRICULAR EJECTION FRACTION  TECHNIQUE: Standard myocardial SPECT imaging was performed after resting intravenous injection of 10 mCi Tc-48m sestamibi. Subsequently, intravenous infusion of Lexiscan was performed under the supervision of the Cardiology staff. At peak effect of the drug, 30 mCi Tc-66m sestamibi was injected intravenously and standard myocardial SPECT imaging was performed. Quantitative gated imaging was also  performed to evaluate left ventricular wall motion, and estimate left ventricular ejection fraction.  COMPARISON:  None.  FINDINGS: Perfusion: Allowing for breast attenuation artifact, there are no true perfusion defects and there is no evidence of stress-induced ischemia.  Wall Motion: Normal left ventricular wall motion. No left ventricular dilation.  Left Ventricular Ejection Fraction: 66 %  End diastolic volume 74 ml  End systolic volume 25 ml  IMPRESSION: 1. No reversible ischemia or infarction.  2. Normal left ventricular wall motion.  3. Left ventricular ejection fraction 66%  4. Low-risk stress test findings*.  *2012 Appropriate Use Criteria for Coronary Revascularization Focused Update: J Am Coll Cardiol. 4401;02(7):253-664. http://content.airportbarriers.com.aspx?articleid=1201161   Electronically Signed   By: Marybelle Killings M.D.   On: 09/07/2014 16:13    2D Echo: None  Cardiac Cath: None  Admission HPI:   Chief Complaint: SOB  History of Present Illness: Ms. Delossantos is a 74 yo female with CHF, sarcoid, PAH, COPD, DMII, HTN, Fe deficiency anemia, and GERD. She presents with 4 day history of worsening SOB, since discharge from [facility]. She was recently admitted to [facility] for worsening SOB. During that admission, she was diuresed multiple liters of fluid and increased he prednisone to 20 mg BID. She had an echo at that time, where they said she has CHF, but she does not remember what kind. Her daughter reports that her breathing has been getting worse overall for the last 3-4 months.  Since her discharge, she has had 10 lb weight gain, worsening SOB, and feeling clammy. She endorses eating more salt than she is supposed to do. She denies medication noncompliance. She chronically sleeps on 3 pillows at night, cannot lie flat, and requires 3L [place] at baseline. She endorses worsening leg swelling and abdominal swelling, which is where she usually holds her fluid. Tonight, she also  experienced 7/10, nonradiating, sternal/substernal chest pain.  She describes the pain as like she "couldn't straighten up." The pain came in waves, and she would become diaphoretic, nauseated, and she eventually vomited. The pain was relieved with Nitro x3 by EMS. She denies chest pain in the ED. She has no history of ACS.  She endorses night sweats so bad that she has to change her sheets. She denies fever, chills, cough, changes in bowel or bladder habits, or sick contacts. Her last BM was yesterday.  She confirmed DNR status on admission.  Records Obtained from [facility] contradict what patient said - 08/28/14 - 09/02/14-  Patient was managed for acute bronchitis, not fluid overload with antibiotics- Rocephin Zithromax and IV Solu-Medrol and Steroids. At time of discharge, she was advised to follow-up with Dr. Malvin Johns (pulmonology) and her PCP within the following week. She was discharged on Zithromax 500mg  PO daily x 3 days, Omnicef 300mg  PO BID x 7 days and prednisone 20mg  BID (ordered to take until f/u with pulmonologist, Dr. Lamonte Sakai).  Physical Exam: Blood pressure 115/58, pulse 78, temperature 98.3 F (36.8 C), temperature source Oral, resp. rate 18, height 5\' 5"  (1.651 m), weight 202 lb 4.8 oz (91.763 kg), SpO2 100 %. Physical Exam  Constitutional: She is oriented to person, place, and time and well-developed, well-nourished, and in no distress.  HENT:  Head: Normocephalic and atraumatic.  Eyes: EOM are normal.  Neck: Normal range of motion. No tracheal deviation present.  JVD to level of the clavicle with bed at 45 degrees.  Cardiovascular: Normal rate, regular rhythm and intact distal pulses.  Grade II/VI decrescendo murmur heard best at LUSB.  Pulmonary/Chest: Effort normal. No respiratory distress. She has no wheezes.  Crackles at bilateral bases.  Abdominal: Bowel sounds are normal.  Moderately distended, without fluid wave or pitting edema. Nontender to palpation, without  guarding or rebound.  Musculoskeletal:  2+ pitting edema to knee bilaterally.  Neurological: She is alert and oriented to person, place, and time.  Skin: Skin is warm and dry.   Hospital Course by problem list:   1. Chest Pain- with both typical and atypical features. ECG that was unremarkable and troponins x 4 that were negative. Complete metabolic panel and CBC were unremarkable. She was placed on telemetry, with no concerning findings over hospitalization. She was evaluated by the cardiology service, who recommended a nuclear stress study. Results of  myocardial SPECT imaging on 09/07/14- No reversible ischemia or infraction, normal left ventricular wall motion, left ventricular ejection fraction of 66%, and overall low-risk stress test findings. No further cardiac work up was recommended.Patient was started on 81mg  aspirin daily. She would benefit from addition of statin long-term given history of diabetes mellitus. Addition of moderate to high intensity statin is deferred to PCP. Finally, patient was seen by PT who recommended Home Health PT at discharge for gait training and exercises to improve her strength and balance; order was placed. Patient had no more episodes of chest pain during admission.  2. Probable Chronic diastolic congestive heart failure- ECHO result not consistent. Initial complaints of icreased leg, abdominal swelling, and worsening SOB, which on further questioning appeared chronic, and on exam patient appeared Euvolemic, CXR unremarkable, BNP was 127. Patient was given dose of 40mg  IV Lasix, and switched to home lasix dose of 80mg  daily. No increase in O2 requirements throughout admission.  Her home Lasix 80mg  PO daily and Aldactone 25mg  twice daily was resumed. Echocardiogram results from [facility] were obtained (08/29/14)- Normal left ventricular size, systolic function with high  normal EF (estimated >46%), normal diastolic filling pattern, mild calcific aortic stenosis  with associated mild regurgitation, mitral annular calcification and leaflet thickening, mild regurgitation, marked left atrial dilatation, mild elevation of pulmonary artery pressure. Patient was discharged on home Lasix 80mg  PO daily and Aldactone 25mg  PO twice daily.    3. Chronic respiratory failure- 2/2 pulmonary sarcoidosis with chronic 3L O2 dependence. Echo results from [place] with mild Pulmonary artery HTN. Stable throughout admission. Meds from [facility] Hospitalization continued on admission- (20mg  BID) 40mg  prednisone daily. Her home tiotropium bromide-olodaterol (2.5-2.75mcg/act) 2 puffs daily was continued and PRN Proventil nebs were made available. Patient has follow-up appointments scheduled with Dr. Helene Kelp (PCP) on 09/09/14 and Dr. Lamonte Sakai (pulmonologist) on 09/16/14.   4. DMII. Patient's last A1c measured 7.8% in May 2016. Placed on Sliding scale and levemir on admission. Home meds- levemir- 32u daily and Humalog 8u TID continued on discharge. Patient was cautioned that she will need to follow her glucose and her weight closely while on daily prednisone.   5. RLS. Ropinerole 3mg  twice daily, gabapentin 300mg  qHS and Depakote 1000mg  daily were continued during hospitalization.   6. Depression. Celexa 40mg  qhs was continued during hospitalization.   7. GERD. Pantoprazole 40mg  by mouth twice daily was continued during hospitalization.   Discharge Vitals:   BP 135/61 mmHg  Pulse 83  Temp(Src) 98.1 F (36.7 C) (Oral)  Resp 16  Ht 5\' 5"  (1.651 m)  Wt 207 lb 4.8 oz (94.031 kg)  BMI 34.50 kg/m2  SpO2 99%  Discharge Labs:  Results for orders placed or performed during the hospital encounter of 09/06/14 (from the past 24 hour(s))  Glucose, capillary     Status: Abnormal   Collection Time: 09/07/14  1:03 PM  Result Value Ref Range   Glucose-Capillary 306 (H) 65 - 99 mg/dL   Comment 1 Notify RN    Comment 2 Document in Chart   Glucose, capillary     Status: Abnormal   Collection  Time: 09/07/14  4:32 PM  Result Value Ref Range   Glucose-Capillary 492 (H) 65 - 99 mg/dL  Troponin I-serum (0, 3, 6 hours)     Status: None   Collection Time: 09/07/14  5:16 PM  Result Value Ref Range   Troponin I <0.03 <0.031 ng/mL  Glucose, capillary     Status: Abnormal   Collection Time: 09/07/14  5:57 PM  Result Value Ref Range   Glucose-Capillary 475 (H) 65 - 99 mg/dL  Glucose, capillary     Status: Abnormal   Collection Time: 09/07/14  6:56 PM  Result Value Ref Range   Glucose-Capillary 400 (H) 65 - 99 mg/dL  Glucose, capillary     Status: Abnormal   Collection Time: 09/07/14  8:56 PM  Result Value Ref Range   Glucose-Capillary 307 (H) 65 - 99 mg/dL  Glucose, capillary     Status: Abnormal   Collection Time: 09/08/14  7:33 AM  Result Value Ref Range   Glucose-Capillary 158 (H) 65 - 99 mg/dL   Comment 1 Notify RN    Comment 2 Document in Chart     Signed: Bethena Roys, MD 09/08/2014, 11:30 AM   Services Ordered on Discharge: Home health PT, RN. Equipment Ordered on Discharge: None

## 2014-09-09 DIAGNOSIS — I509 Heart failure, unspecified: Secondary | ICD-10-CM | POA: Diagnosis not present

## 2014-09-09 DIAGNOSIS — Z825 Family history of asthma and other chronic lower respiratory diseases: Secondary | ICD-10-CM | POA: Diagnosis not present

## 2014-09-09 DIAGNOSIS — J9620 Acute and chronic respiratory failure, unspecified whether with hypoxia or hypercapnia: Secondary | ICD-10-CM | POA: Diagnosis not present

## 2014-09-09 DIAGNOSIS — W19XXXA Unspecified fall, initial encounter: Secondary | ICD-10-CM | POA: Diagnosis not present

## 2014-09-10 DIAGNOSIS — E1165 Type 2 diabetes mellitus with hyperglycemia: Secondary | ICD-10-CM | POA: Diagnosis not present

## 2014-09-10 DIAGNOSIS — K219 Gastro-esophageal reflux disease without esophagitis: Secondary | ICD-10-CM | POA: Diagnosis not present

## 2014-09-10 DIAGNOSIS — J209 Acute bronchitis, unspecified: Secondary | ICD-10-CM | POA: Diagnosis not present

## 2014-09-10 DIAGNOSIS — J8410 Pulmonary fibrosis, unspecified: Secondary | ICD-10-CM | POA: Diagnosis not present

## 2014-09-10 DIAGNOSIS — D869 Sarcoidosis, unspecified: Secondary | ICD-10-CM | POA: Diagnosis not present

## 2014-09-12 DIAGNOSIS — K219 Gastro-esophageal reflux disease without esophagitis: Secondary | ICD-10-CM | POA: Diagnosis not present

## 2014-09-12 DIAGNOSIS — J8410 Pulmonary fibrosis, unspecified: Secondary | ICD-10-CM | POA: Diagnosis not present

## 2014-09-12 DIAGNOSIS — J209 Acute bronchitis, unspecified: Secondary | ICD-10-CM | POA: Diagnosis not present

## 2014-09-12 DIAGNOSIS — D869 Sarcoidosis, unspecified: Secondary | ICD-10-CM | POA: Diagnosis not present

## 2014-09-12 DIAGNOSIS — E1165 Type 2 diabetes mellitus with hyperglycemia: Secondary | ICD-10-CM | POA: Diagnosis not present

## 2014-09-13 ENCOUNTER — Telehealth: Payer: Self-pay | Admitting: Emergency Medicine

## 2014-09-13 DIAGNOSIS — J8410 Pulmonary fibrosis, unspecified: Secondary | ICD-10-CM | POA: Diagnosis not present

## 2014-09-13 DIAGNOSIS — K219 Gastro-esophageal reflux disease without esophagitis: Secondary | ICD-10-CM | POA: Diagnosis not present

## 2014-09-13 DIAGNOSIS — D869 Sarcoidosis, unspecified: Secondary | ICD-10-CM | POA: Diagnosis not present

## 2014-09-13 DIAGNOSIS — J209 Acute bronchitis, unspecified: Secondary | ICD-10-CM | POA: Diagnosis not present

## 2014-09-13 DIAGNOSIS — E1165 Type 2 diabetes mellitus with hyperglycemia: Secondary | ICD-10-CM | POA: Diagnosis not present

## 2014-09-13 NOTE — Telephone Encounter (Signed)
Yes ok to order tid

## 2014-09-13 NOTE — Telephone Encounter (Signed)
LMTCB x 1 for [facility]

## 2014-09-13 NOTE — Telephone Encounter (Signed)
Patient's nurse, Solmon Ice called and said that patient needs refill on Albuterol neb.  We do not have this in her medication list.  Nurse says that she is suppose to be taking Albuterol 0.083% by neb 3 times daily.  However, patient has not been taking it like she should and they are trying to get her back on the regular treatments.  She wanted to know if we could send in refill.  She says that patient has appointment with Dr. Lamonte Sakai this week.  Dr. Lamonte Sakai, please advise if you would like to go ahead and refill this or ok for patient to wait until her appointment on 7/28?

## 2014-09-14 DIAGNOSIS — E1165 Type 2 diabetes mellitus with hyperglycemia: Secondary | ICD-10-CM | POA: Diagnosis not present

## 2014-09-14 DIAGNOSIS — D869 Sarcoidosis, unspecified: Secondary | ICD-10-CM | POA: Diagnosis not present

## 2014-09-14 DIAGNOSIS — J8410 Pulmonary fibrosis, unspecified: Secondary | ICD-10-CM | POA: Diagnosis not present

## 2014-09-14 DIAGNOSIS — J209 Acute bronchitis, unspecified: Secondary | ICD-10-CM | POA: Diagnosis not present

## 2014-09-14 DIAGNOSIS — K219 Gastro-esophageal reflux disease without esophagitis: Secondary | ICD-10-CM | POA: Diagnosis not present

## 2014-09-14 MED ORDER — ALBUTEROL SULFATE (2.5 MG/3ML) 0.083% IN NEBU: 2.5000 mg | INHALATION_SOLUTION | Freq: Three times a day (TID) | RESPIRATORY_TRACT | Status: AC

## 2014-09-14 NOTE — Telephone Encounter (Signed)
Spoke with Solmon Ice, she is aware of recs.  New rx sent to preferred pharmacy.  Nothing further needed.

## 2014-09-15 DIAGNOSIS — D869 Sarcoidosis, unspecified: Secondary | ICD-10-CM | POA: Diagnosis not present

## 2014-09-15 DIAGNOSIS — J209 Acute bronchitis, unspecified: Secondary | ICD-10-CM | POA: Diagnosis not present

## 2014-09-15 DIAGNOSIS — K219 Gastro-esophageal reflux disease without esophagitis: Secondary | ICD-10-CM | POA: Diagnosis not present

## 2014-09-15 DIAGNOSIS — J8410 Pulmonary fibrosis, unspecified: Secondary | ICD-10-CM | POA: Diagnosis not present

## 2014-09-15 DIAGNOSIS — E1165 Type 2 diabetes mellitus with hyperglycemia: Secondary | ICD-10-CM | POA: Diagnosis not present

## 2014-09-16 ENCOUNTER — Ambulatory Visit (INDEPENDENT_AMBULATORY_CARE_PROVIDER_SITE_OTHER): Payer: Medicare Other | Admitting: Emergency Medicine

## 2014-09-16 ENCOUNTER — Encounter: Payer: Self-pay | Admitting: Emergency Medicine

## 2014-09-16 VITALS — BP 150/78 | HR 103 | Ht 65.0 in | Wt 198.0 lb

## 2014-09-16 DIAGNOSIS — I509 Heart failure, unspecified: Secondary | ICD-10-CM | POA: Diagnosis not present

## 2014-09-16 DIAGNOSIS — D869 Sarcoidosis, unspecified: Secondary | ICD-10-CM | POA: Diagnosis not present

## 2014-09-16 DIAGNOSIS — J302 Other seasonal allergic rhinitis: Secondary | ICD-10-CM

## 2014-09-16 DIAGNOSIS — J9611 Chronic respiratory failure with hypoxia: Secondary | ICD-10-CM | POA: Diagnosis not present

## 2014-09-16 DIAGNOSIS — J8410 Pulmonary fibrosis, unspecified: Secondary | ICD-10-CM | POA: Diagnosis not present

## 2014-09-16 DIAGNOSIS — E1165 Type 2 diabetes mellitus with hyperglycemia: Secondary | ICD-10-CM | POA: Diagnosis not present

## 2014-09-16 DIAGNOSIS — J209 Acute bronchitis, unspecified: Secondary | ICD-10-CM | POA: Diagnosis not present

## 2014-09-16 DIAGNOSIS — K219 Gastro-esophageal reflux disease without esophagitis: Secondary | ICD-10-CM | POA: Diagnosis not present

## 2014-09-16 NOTE — Patient Instructions (Signed)
Please continue your Lasix as you're currently taking it We will arrange for you to see cardiology at [facility] We will arrange for a CT scan of the chest Please decrease her prednisone to 30 mg daily for the next week. If your doing well at that time then decrease down to 20 mg daily and stay at this dose until you follow up Continue Stiolto daily Continue your oxygen at 3 L/m Follow with Dr Lamonte Sakai in 1 month or next available

## 2014-09-16 NOTE — Progress Notes (Signed)
Subjective:    Patient ID: Penny Curry, female    DOB: 01/22/1941    MRN: [id number]  HPI 74 yo woman, former tobacco, recently dx w DM. Referred by Dr Helene Kelp for dyspnea and hx ILD followed by Dr DeCoy in [place], initially received dx of IPF. Underwent FOB and Bx in 2005, treated with pred and cytoxan in the past. Then had nodules on arm that were bx and showed probable sarcoidosis Summer 2011 ([place]). Last CT scan was prob 11/2009 (she isn't sure). 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   She has had an ONO that showed she needs O2 at night. She wears 3L/min at bedtime. She had PSG last summer [facility] - said she does not have OSA but needs O2.   ROV 02/25/13 -- f/u for sarcoidosis, assoc ILD, mixed obstructive and restrictive disease. She has had trouble with cough, prod of thick yellow phlegm and then green phlegm. Was treated with abx in 11/'14 and the phlegm cleared. Continued to have sinus drainage. Then had recurrent bout of bronchitis end of Dec. Just finished a round of levaquin. She still has hoarse voice, cough.  She is using fluticasone, generic benadryl some nights. She is now on pantoprazole (was on omeprazole). Just discovered that her Vit D is low.   ROV 07/09/13 -- hx sarcoidosis, resultant ILD, mixed obstruction and restriction. Also hx allergies and chronic cough.  She is on Symbicort, flonase, loratadine. Was treated with levaquin again beginning . She describes a month of worsening exertional SOB and she has noticed low oxygen levels with exertion. She had some increasing edema, hypertension.  Some occas cough but not much, some low grade fever. Hoarse voice. Lots of nasal congestion on fluticasone and loratadine. She was started on lasix by her PCP.  She has not been using her O2 w exertion.    ROV 08/05/13 -- hx sarcoidosis, resultant ILD, mixed obstruction and restriction; allergies and chronic cough. Last visit she had increased edema and there was concern for  decompensation of secondary pulmonary hypertension. We performed echocardiogram and repeated her CT scan of the chest as below. Her RVSP and right ventricular function were normal. Her CT scan of the chest was grossly unchanged compared with 2012. She continues to have some LE edema. She is on symbicort, SABA prn.  Her breathing may be a bit better since she started using O2 more reliably.    Acute OV  hx sarcoidosis, resultant ILD, mixed obstruction and restriction; allergies and chronic cough. Complains of increased SOB, wheezing, prod cough with yellow mucus, head congestion and PND, some occasional chills/sweats x 4 weeks, worse x1 week.   Denies any hemoptysis , chest pain, orthopnea, fever, edema.  No recent abx or steroids  Says BS are well controlled on her Insulin regimen   ROV 01/07/14 -- follow up for her sarcoidosis, associated ILD, chronic cough.  She was recently treated for an AE with improvement. She is feeling tired all the time, can nap and fall asleep. O2 on 3L/min pulsed. She is on symbicort + SABA prn, uses about 2x a day.   ROV 02/04/14 -- follow up visit for follow up for her sarcoidosis, associated ILD, chronic cough. Last time we stopped Symbicort and started stiolto > she can't tell very much difference. Her cough has worsened over the last 2 weeks, associated with some yellow mucous. Some nasal congestion. She has had multiple sick contacts. She hasn't tried any OTC meds.  Acute OV  07/05/14 --  Pt returns for follow up for sarcoid flare.  Seen at PCP yesterday , started on pred taper.  Says she has been weak with progressive DOE for last few months . Worse for last couple of weeks.  Labs were done shown  she had progressive anemia.  Says she has been undergoing anemia workup with recent endo and colonoscopy. Hbg tr down from 11.6  to 9 . CXR showed chronic changes.  Denies NSAID use.  Remains on Stiolto . On 3l/m of O2 , sats good at 96%.  She denies any fever,  productive cough, discolored mucus, orthopnea, PND, or increased leg swelling. Has not noticed much difference in her breathing since yesterday.  denies rash.  ROV 08/06/14 -- follow up visit for sarcoidosis, ILD, chronic cough, chronic hypoxemia.  Also anemia as described above, Hgb from 10 > 8.8. She has been started on Fe, has not required transfusion. She has been on pred x 2 since 12/15, on both occasional for progressive exertional dyspnea. Her O2 is set on 3L/min at all times. Her exertional tolerance is low. She has CP at times w exertion. She has cough, productive, worst at night when supine. She is on pantoprazole 40 bid. She remains on stiolto, isn't sure whether it is helping her.   ROV 09/16/14 -- follow-up visit for sarcoidosis, associated interstitial lung disease and hypoxemia and she also has chronic cough and iron deficiency. She was unfortunately recently hospitalized at [facility] and then again 7/18-7/20 at [facility]. Complicated presentation that sounds like CHF with some associated CP, but also had fever, mucous, cough. She was diuresed 13 lbs at [place] [place], gained 6 lbs back and ended up admitted to [facility]. TTE was reportedly done at [facility]. She is on 40mg  pred and 120mg  lasix Part of her evaluation on that admission was a myocardial perfusion scan that was low risk for ischemia. She is on Stiolto.      Objective:   Physical Exam Filed Vitals:   09/16/14 1658  BP: 150/78  Pulse: 103  Height: 5\' 5"  (1.651 m)  Weight: 198 lb (89.812 kg)  SpO2: 97%    Gen: obese woman, no distress   ENT: No lesions,  mouth clear,  oropharynx clear, no postnasal drip Neck: No JVD, no TMG, no carotid bruits  Lungs: No use of accessory muscles, no dullness to percussion, Coarse BS w/ no wheezing   Cardiovascular: RRR, heart sounds normal, no murmur or gallops, tr  peripheral edema  Musculoskeletal: No deformities, no cyanosis or clubbing  Neuro: alert, non focal  Skin: Warm, no lesions or  rashes    07/04/14 --  COMPARISON: 02/27/2014 and 03/09/2013  FINDINGS: There are no acute infiltrates or effusions. There is chronic accentuation of the interstitial markings with tortuosity and calcification of the thoracic aorta. Chronic blunting of the left costophrenic angle laterally. Chronic thoracolumbar scoliosis.  Pulmonary vascularity appears normal. No acute osseous abnormality.  IMPRESSION: Chronic interstitial lung disease. No acute abnormalities     Assessment & Plan:   Chronic respiratory failure with hypoxia The etiology of her recent decompensation and her worsening is not entirely clear. She was obviously treated for both volume overload/CHF, acute infection, and bronchospasm. Restart her readmission and her weight gain I suspect that volume overload was a big part of this. I would like to have her seen at [facility] cardiology. I will try to obtain her echocardiogram from [facility]. We will plan to wean her prednisone off slowly, continue  same Lasix dose. Continue Stiolto.   INTERSTITIAL LUNG DISEASE Repeat CT scan of the chest to compare with prior; not clear how much of her decompensation might be progression of sarcoidosis interstitial disease  Allergic rhinitis, seasonal Continue current regimen  PULMONARY SARCOIDOSIS Continue Stiolto as ordered

## 2014-09-17 DIAGNOSIS — E1165 Type 2 diabetes mellitus with hyperglycemia: Secondary | ICD-10-CM | POA: Diagnosis not present

## 2014-09-17 DIAGNOSIS — K219 Gastro-esophageal reflux disease without esophagitis: Secondary | ICD-10-CM | POA: Diagnosis not present

## 2014-09-17 DIAGNOSIS — R309 Painful micturition, unspecified: Secondary | ICD-10-CM | POA: Diagnosis not present

## 2014-09-17 DIAGNOSIS — D869 Sarcoidosis, unspecified: Secondary | ICD-10-CM | POA: Diagnosis not present

## 2014-09-17 DIAGNOSIS — J8410 Pulmonary fibrosis, unspecified: Secondary | ICD-10-CM | POA: Diagnosis not present

## 2014-09-17 DIAGNOSIS — J209 Acute bronchitis, unspecified: Secondary | ICD-10-CM | POA: Diagnosis not present

## 2014-09-17 NOTE — Assessment & Plan Note (Signed)
The etiology of her recent decompensation and her worsening is not entirely clear. She was obviously treated for both volume overload/CHF, acute infection, and bronchospasm. Restart her readmission and her weight gain I suspect that volume overload was a big part of this. I would like to have her seen at [facility] cardiology. I will try to obtain her echocardiogram from [facility]. We will plan to wean her prednisone off slowly, continue same Lasix dose. Continue Stiolto.

## 2014-09-17 NOTE — Assessment & Plan Note (Signed)
Repeat CT scan of the chest to compare with prior; not clear how much of her decompensation might be progression of sarcoidosis interstitial disease

## 2014-09-17 NOTE — Assessment & Plan Note (Signed)
Continue Stiolto as ordered

## 2014-09-17 NOTE — Assessment & Plan Note (Signed)
Continue current regimen

## 2014-09-18 DIAGNOSIS — J449 Chronic obstructive pulmonary disease, unspecified: Secondary | ICD-10-CM | POA: Diagnosis not present

## 2014-09-20 DIAGNOSIS — J8410 Pulmonary fibrosis, unspecified: Secondary | ICD-10-CM | POA: Diagnosis not present

## 2014-09-20 DIAGNOSIS — D869 Sarcoidosis, unspecified: Secondary | ICD-10-CM | POA: Diagnosis not present

## 2014-09-20 DIAGNOSIS — J209 Acute bronchitis, unspecified: Secondary | ICD-10-CM | POA: Diagnosis not present

## 2014-09-20 DIAGNOSIS — E1165 Type 2 diabetes mellitus with hyperglycemia: Secondary | ICD-10-CM | POA: Diagnosis not present

## 2014-09-20 DIAGNOSIS — K219 Gastro-esophageal reflux disease without esophagitis: Secondary | ICD-10-CM | POA: Diagnosis not present

## 2014-09-21 DIAGNOSIS — I509 Heart failure, unspecified: Secondary | ICD-10-CM | POA: Diagnosis not present

## 2014-09-21 DIAGNOSIS — Z7952 Long term (current) use of systemic steroids: Secondary | ICD-10-CM | POA: Diagnosis not present

## 2014-09-21 DIAGNOSIS — J45909 Unspecified asthma, uncomplicated: Secondary | ICD-10-CM | POA: Diagnosis not present

## 2014-09-21 DIAGNOSIS — Z794 Long term (current) use of insulin: Secondary | ICD-10-CM | POA: Diagnosis not present

## 2014-09-21 DIAGNOSIS — D869 Sarcoidosis, unspecified: Secondary | ICD-10-CM | POA: Diagnosis not present

## 2014-09-21 DIAGNOSIS — Z87891 Personal history of nicotine dependence: Secondary | ICD-10-CM | POA: Diagnosis not present

## 2014-09-21 DIAGNOSIS — E780 Pure hypercholesterolemia: Secondary | ICD-10-CM | POA: Diagnosis not present

## 2014-09-21 DIAGNOSIS — R531 Weakness: Secondary | ICD-10-CM | POA: Diagnosis not present

## 2014-09-21 DIAGNOSIS — J8410 Pulmonary fibrosis, unspecified: Secondary | ICD-10-CM | POA: Diagnosis not present

## 2014-09-21 DIAGNOSIS — R112 Nausea with vomiting, unspecified: Secondary | ICD-10-CM | POA: Diagnosis not present

## 2014-09-21 DIAGNOSIS — K219 Gastro-esophageal reflux disease without esophagitis: Secondary | ICD-10-CM | POA: Diagnosis not present

## 2014-09-21 DIAGNOSIS — K760 Fatty (change of) liver, not elsewhere classified: Secondary | ICD-10-CM | POA: Diagnosis not present

## 2014-09-21 DIAGNOSIS — R739 Hyperglycemia, unspecified: Secondary | ICD-10-CM | POA: Diagnosis not present

## 2014-09-21 DIAGNOSIS — J209 Acute bronchitis, unspecified: Secondary | ICD-10-CM | POA: Diagnosis not present

## 2014-09-21 DIAGNOSIS — Z7982 Long term (current) use of aspirin: Secondary | ICD-10-CM | POA: Diagnosis not present

## 2014-09-21 DIAGNOSIS — E1165 Type 2 diabetes mellitus with hyperglycemia: Secondary | ICD-10-CM | POA: Diagnosis not present

## 2014-09-21 DIAGNOSIS — R109 Unspecified abdominal pain: Secondary | ICD-10-CM | POA: Diagnosis not present

## 2014-09-21 DIAGNOSIS — J439 Emphysema, unspecified: Secondary | ICD-10-CM | POA: Diagnosis not present

## 2014-09-22 DIAGNOSIS — K219 Gastro-esophageal reflux disease without esophagitis: Secondary | ICD-10-CM | POA: Diagnosis not present

## 2014-09-22 DIAGNOSIS — D869 Sarcoidosis, unspecified: Secondary | ICD-10-CM | POA: Diagnosis not present

## 2014-09-22 DIAGNOSIS — J8410 Pulmonary fibrosis, unspecified: Secondary | ICD-10-CM | POA: Diagnosis not present

## 2014-09-22 DIAGNOSIS — J209 Acute bronchitis, unspecified: Secondary | ICD-10-CM | POA: Diagnosis not present

## 2014-09-22 DIAGNOSIS — E1165 Type 2 diabetes mellitus with hyperglycemia: Secondary | ICD-10-CM | POA: Diagnosis not present

## 2014-09-23 ENCOUNTER — Telehealth (HOSPITAL_COMMUNITY): Payer: Self-pay | Admitting: Vascular Surgery

## 2014-09-23 ENCOUNTER — Inpatient Hospital Stay: Admission: RE | Admit: 2014-09-23 | Payer: Medicare Other | Source: Ambulatory Visit

## 2014-09-23 NOTE — Telephone Encounter (Signed)
Called pt the person who answered the phone said she is not here and hung up.. Will try back later

## 2014-09-24 DIAGNOSIS — J209 Acute bronchitis, unspecified: Secondary | ICD-10-CM | POA: Diagnosis not present

## 2014-09-24 DIAGNOSIS — J8410 Pulmonary fibrosis, unspecified: Secondary | ICD-10-CM | POA: Diagnosis not present

## 2014-09-24 DIAGNOSIS — D869 Sarcoidosis, unspecified: Secondary | ICD-10-CM | POA: Diagnosis not present

## 2014-09-24 DIAGNOSIS — K219 Gastro-esophageal reflux disease without esophagitis: Secondary | ICD-10-CM | POA: Diagnosis not present

## 2014-09-24 DIAGNOSIS — E1165 Type 2 diabetes mellitus with hyperglycemia: Secondary | ICD-10-CM | POA: Diagnosis not present

## 2014-09-27 DIAGNOSIS — E1165 Type 2 diabetes mellitus with hyperglycemia: Secondary | ICD-10-CM | POA: Diagnosis not present

## 2014-09-27 DIAGNOSIS — J209 Acute bronchitis, unspecified: Secondary | ICD-10-CM | POA: Diagnosis not present

## 2014-09-27 DIAGNOSIS — D869 Sarcoidosis, unspecified: Secondary | ICD-10-CM | POA: Diagnosis not present

## 2014-09-27 DIAGNOSIS — J8410 Pulmonary fibrosis, unspecified: Secondary | ICD-10-CM | POA: Diagnosis not present

## 2014-09-27 DIAGNOSIS — K219 Gastro-esophageal reflux disease without esophagitis: Secondary | ICD-10-CM | POA: Diagnosis not present

## 2014-09-28 DIAGNOSIS — K219 Gastro-esophageal reflux disease without esophagitis: Secondary | ICD-10-CM | POA: Diagnosis not present

## 2014-09-28 DIAGNOSIS — D869 Sarcoidosis, unspecified: Secondary | ICD-10-CM | POA: Diagnosis not present

## 2014-09-28 DIAGNOSIS — J209 Acute bronchitis, unspecified: Secondary | ICD-10-CM | POA: Diagnosis not present

## 2014-09-28 DIAGNOSIS — J8410 Pulmonary fibrosis, unspecified: Secondary | ICD-10-CM | POA: Diagnosis not present

## 2014-09-28 DIAGNOSIS — E1165 Type 2 diabetes mellitus with hyperglycemia: Secondary | ICD-10-CM | POA: Diagnosis not present

## 2014-09-30 ENCOUNTER — Ambulatory Visit (INDEPENDENT_AMBULATORY_CARE_PROVIDER_SITE_OTHER)
Admission: RE | Admit: 2014-09-30 | Discharge: 2014-09-30 | Disposition: A | Discharge status: Home / Self Care | Payer: Medicare Other | Source: Ambulatory Visit | Attending: Emergency Medicine | Admitting: Emergency Medicine

## 2014-09-30 DIAGNOSIS — E1165 Type 2 diabetes mellitus with hyperglycemia: Secondary | ICD-10-CM | POA: Diagnosis not present

## 2014-09-30 DIAGNOSIS — J209 Acute bronchitis, unspecified: Secondary | ICD-10-CM | POA: Diagnosis not present

## 2014-09-30 DIAGNOSIS — J9600 Acute respiratory failure, unspecified whether with hypoxia or hypercapnia: Secondary | ICD-10-CM | POA: Diagnosis not present

## 2014-09-30 DIAGNOSIS — I2510 Atherosclerotic heart disease of native coronary artery without angina pectoris: Secondary | ICD-10-CM | POA: Diagnosis not present

## 2014-09-30 DIAGNOSIS — J479 Bronchiectasis, uncomplicated: Secondary | ICD-10-CM | POA: Diagnosis not present

## 2014-09-30 DIAGNOSIS — K219 Gastro-esophageal reflux disease without esophagitis: Secondary | ICD-10-CM | POA: Diagnosis not present

## 2014-09-30 DIAGNOSIS — J8410 Pulmonary fibrosis, unspecified: Secondary | ICD-10-CM | POA: Diagnosis not present

## 2014-09-30 DIAGNOSIS — D869 Sarcoidosis, unspecified: Secondary | ICD-10-CM | POA: Diagnosis not present

## 2014-10-01 DIAGNOSIS — J8410 Pulmonary fibrosis, unspecified: Secondary | ICD-10-CM | POA: Diagnosis not present

## 2014-10-01 DIAGNOSIS — E1165 Type 2 diabetes mellitus with hyperglycemia: Secondary | ICD-10-CM | POA: Diagnosis not present

## 2014-10-01 DIAGNOSIS — J209 Acute bronchitis, unspecified: Secondary | ICD-10-CM | POA: Diagnosis not present

## 2014-10-01 DIAGNOSIS — D869 Sarcoidosis, unspecified: Secondary | ICD-10-CM | POA: Diagnosis not present

## 2014-10-01 DIAGNOSIS — K219 Gastro-esophageal reflux disease without esophagitis: Secondary | ICD-10-CM | POA: Diagnosis not present

## 2014-10-04 DIAGNOSIS — D869 Sarcoidosis, unspecified: Secondary | ICD-10-CM | POA: Diagnosis not present

## 2014-10-04 DIAGNOSIS — J8410 Pulmonary fibrosis, unspecified: Secondary | ICD-10-CM | POA: Diagnosis not present

## 2014-10-04 DIAGNOSIS — E1165 Type 2 diabetes mellitus with hyperglycemia: Secondary | ICD-10-CM | POA: Diagnosis not present

## 2014-10-04 DIAGNOSIS — K219 Gastro-esophageal reflux disease without esophagitis: Secondary | ICD-10-CM | POA: Diagnosis not present

## 2014-10-04 DIAGNOSIS — J209 Acute bronchitis, unspecified: Secondary | ICD-10-CM | POA: Diagnosis not present

## 2014-10-05 DIAGNOSIS — E1165 Type 2 diabetes mellitus with hyperglycemia: Secondary | ICD-10-CM | POA: Diagnosis not present

## 2014-10-05 DIAGNOSIS — D869 Sarcoidosis, unspecified: Secondary | ICD-10-CM | POA: Diagnosis not present

## 2014-10-05 DIAGNOSIS — K219 Gastro-esophageal reflux disease without esophagitis: Secondary | ICD-10-CM | POA: Diagnosis not present

## 2014-10-05 DIAGNOSIS — J209 Acute bronchitis, unspecified: Secondary | ICD-10-CM | POA: Diagnosis not present

## 2014-10-05 DIAGNOSIS — J8410 Pulmonary fibrosis, unspecified: Secondary | ICD-10-CM | POA: Diagnosis not present

## 2014-10-06 ENCOUNTER — Telehealth (HOSPITAL_COMMUNITY): Payer: Self-pay | Admitting: Vascular Surgery

## 2014-10-06 DIAGNOSIS — J8410 Pulmonary fibrosis, unspecified: Secondary | ICD-10-CM | POA: Diagnosis not present

## 2014-10-06 DIAGNOSIS — E1165 Type 2 diabetes mellitus with hyperglycemia: Secondary | ICD-10-CM | POA: Diagnosis not present

## 2014-10-06 DIAGNOSIS — K219 Gastro-esophageal reflux disease without esophagitis: Secondary | ICD-10-CM | POA: Diagnosis not present

## 2014-10-06 DIAGNOSIS — D869 Sarcoidosis, unspecified: Secondary | ICD-10-CM | POA: Diagnosis not present

## 2014-10-06 DIAGNOSIS — J209 Acute bronchitis, unspecified: Secondary | ICD-10-CM | POA: Diagnosis not present

## 2014-10-06 NOTE — Telephone Encounter (Signed)
Encounter closed

## 2014-10-07 DIAGNOSIS — J8410 Pulmonary fibrosis, unspecified: Secondary | ICD-10-CM | POA: Diagnosis not present

## 2014-10-07 DIAGNOSIS — J209 Acute bronchitis, unspecified: Secondary | ICD-10-CM | POA: Diagnosis not present

## 2014-10-08 DIAGNOSIS — J8410 Pulmonary fibrosis, unspecified: Secondary | ICD-10-CM | POA: Diagnosis not present

## 2014-10-08 DIAGNOSIS — E1165 Type 2 diabetes mellitus with hyperglycemia: Secondary | ICD-10-CM | POA: Diagnosis not present

## 2014-10-08 DIAGNOSIS — J209 Acute bronchitis, unspecified: Secondary | ICD-10-CM | POA: Diagnosis not present

## 2014-10-08 DIAGNOSIS — K219 Gastro-esophageal reflux disease without esophagitis: Secondary | ICD-10-CM | POA: Diagnosis not present

## 2014-10-08 DIAGNOSIS — D869 Sarcoidosis, unspecified: Secondary | ICD-10-CM | POA: Diagnosis not present

## 2014-10-09 DIAGNOSIS — J449 Chronic obstructive pulmonary disease, unspecified: Secondary | ICD-10-CM | POA: Diagnosis not present

## 2014-10-09 DIAGNOSIS — R079 Chest pain, unspecified: Secondary | ICD-10-CM | POA: Diagnosis not present

## 2014-10-09 DIAGNOSIS — E119 Type 2 diabetes mellitus without complications: Secondary | ICD-10-CM | POA: Diagnosis not present

## 2014-10-09 DIAGNOSIS — M5440 Lumbago with sciatica, unspecified side: Secondary | ICD-10-CM | POA: Diagnosis not present

## 2014-10-11 DIAGNOSIS — E1165 Type 2 diabetes mellitus with hyperglycemia: Secondary | ICD-10-CM | POA: Diagnosis not present

## 2014-10-11 DIAGNOSIS — D869 Sarcoidosis, unspecified: Secondary | ICD-10-CM | POA: Diagnosis not present

## 2014-10-11 DIAGNOSIS — K219 Gastro-esophageal reflux disease without esophagitis: Secondary | ICD-10-CM | POA: Diagnosis not present

## 2014-10-11 DIAGNOSIS — J8410 Pulmonary fibrosis, unspecified: Secondary | ICD-10-CM | POA: Diagnosis not present

## 2014-10-11 DIAGNOSIS — J209 Acute bronchitis, unspecified: Secondary | ICD-10-CM | POA: Diagnosis not present

## 2014-10-19 DIAGNOSIS — K219 Gastro-esophageal reflux disease without esophagitis: Secondary | ICD-10-CM | POA: Diagnosis not present

## 2014-10-19 DIAGNOSIS — J8410 Pulmonary fibrosis, unspecified: Secondary | ICD-10-CM | POA: Diagnosis not present

## 2014-10-19 DIAGNOSIS — J449 Chronic obstructive pulmonary disease, unspecified: Secondary | ICD-10-CM | POA: Diagnosis not present

## 2014-10-19 DIAGNOSIS — E1165 Type 2 diabetes mellitus with hyperglycemia: Secondary | ICD-10-CM | POA: Diagnosis not present

## 2014-10-19 DIAGNOSIS — J209 Acute bronchitis, unspecified: Secondary | ICD-10-CM | POA: Diagnosis not present

## 2014-10-19 DIAGNOSIS — D869 Sarcoidosis, unspecified: Secondary | ICD-10-CM | POA: Diagnosis not present

## 2014-10-21 ENCOUNTER — Ambulatory Visit: Payer: Medicare Other | Admitting: Emergency Medicine

## 2014-10-21 DIAGNOSIS — D869 Sarcoidosis, unspecified: Secondary | ICD-10-CM | POA: Diagnosis not present

## 2014-10-21 DIAGNOSIS — E1165 Type 2 diabetes mellitus with hyperglycemia: Secondary | ICD-10-CM | POA: Diagnosis not present

## 2014-10-21 DIAGNOSIS — J209 Acute bronchitis, unspecified: Secondary | ICD-10-CM | POA: Diagnosis not present

## 2014-10-21 DIAGNOSIS — R0689 Other abnormalities of breathing: Secondary | ICD-10-CM | POA: Diagnosis not present

## 2014-10-21 DIAGNOSIS — K219 Gastro-esophageal reflux disease without esophagitis: Secondary | ICD-10-CM | POA: Diagnosis not present

## 2014-10-21 DIAGNOSIS — M79605 Pain in left leg: Secondary | ICD-10-CM | POA: Diagnosis not present

## 2014-10-21 DIAGNOSIS — J8410 Pulmonary fibrosis, unspecified: Secondary | ICD-10-CM | POA: Diagnosis not present

## 2014-10-21 DIAGNOSIS — M7989 Other specified soft tissue disorders: Secondary | ICD-10-CM | POA: Diagnosis not present

## 2014-10-21 DIAGNOSIS — R4182 Altered mental status, unspecified: Secondary | ICD-10-CM | POA: Diagnosis not present

## 2014-10-21 DIAGNOSIS — M79604 Pain in right leg: Secondary | ICD-10-CM | POA: Diagnosis not present

## 2014-10-22 ENCOUNTER — Encounter (HOSPITAL_COMMUNITY): Payer: Self-pay | Admitting: *Deleted

## 2014-10-22 ENCOUNTER — Inpatient Hospital Stay (HOSPITAL_COMMUNITY)
Admission: EM | Admit: 2014-10-22 | Discharge: 2014-10-24 | DRG: 602 | Disposition: A | Discharge status: Home / Self Care | Payer: Medicare Other | Attending: Internal Medicine | Admitting: Internal Medicine

## 2014-10-22 ENCOUNTER — Emergency Department (HOSPITAL_COMMUNITY): Payer: Medicare Other

## 2014-10-22 DIAGNOSIS — E1165 Type 2 diabetes mellitus with hyperglycemia: Secondary | ICD-10-CM | POA: Diagnosis not present

## 2014-10-22 DIAGNOSIS — Z6834 Body mass index (BMI) 34.0-34.9, adult: Secondary | ICD-10-CM | POA: Diagnosis not present

## 2014-10-22 DIAGNOSIS — R7989 Other specified abnormal findings of blood chemistry: Secondary | ICD-10-CM

## 2014-10-22 DIAGNOSIS — K219 Gastro-esophageal reflux disease without esophagitis: Secondary | ICD-10-CM | POA: Diagnosis not present

## 2014-10-22 DIAGNOSIS — D649 Anemia, unspecified: Secondary | ICD-10-CM | POA: Diagnosis not present

## 2014-10-22 DIAGNOSIS — G9340 Encephalopathy, unspecified: Secondary | ICD-10-CM | POA: Diagnosis not present

## 2014-10-22 DIAGNOSIS — Z9981 Dependence on supplemental oxygen: Secondary | ICD-10-CM | POA: Diagnosis not present

## 2014-10-22 DIAGNOSIS — D869 Sarcoidosis, unspecified: Secondary | ICD-10-CM | POA: Diagnosis not present

## 2014-10-22 DIAGNOSIS — R2689 Other abnormalities of gait and mobility: Secondary | ICD-10-CM | POA: Diagnosis not present

## 2014-10-22 DIAGNOSIS — Z87891 Personal history of nicotine dependence: Secondary | ICD-10-CM | POA: Diagnosis not present

## 2014-10-22 DIAGNOSIS — R5383 Other fatigue: Secondary | ICD-10-CM

## 2014-10-22 DIAGNOSIS — L03115 Cellulitis of right lower limb: Secondary | ICD-10-CM | POA: Diagnosis not present

## 2014-10-22 DIAGNOSIS — L0390 Cellulitis, unspecified: Secondary | ICD-10-CM | POA: Diagnosis present

## 2014-10-22 DIAGNOSIS — R740 Nonspecific elevation of levels of transaminase and lactic acid dehydrogenase [LDH]: Secondary | ICD-10-CM | POA: Diagnosis not present

## 2014-10-22 DIAGNOSIS — B9689 Other specified bacterial agents as the cause of diseases classified elsewhere: Secondary | ICD-10-CM | POA: Diagnosis not present

## 2014-10-22 DIAGNOSIS — J449 Chronic obstructive pulmonary disease, unspecified: Secondary | ICD-10-CM | POA: Diagnosis not present

## 2014-10-22 DIAGNOSIS — J8410 Pulmonary fibrosis, unspecified: Secondary | ICD-10-CM | POA: Diagnosis not present

## 2014-10-22 DIAGNOSIS — L03116 Cellulitis of left lower limb: Secondary | ICD-10-CM | POA: Insufficient documentation

## 2014-10-22 DIAGNOSIS — B9789 Other viral agents as the cause of diseases classified elsewhere: Secondary | ICD-10-CM | POA: Diagnosis present

## 2014-10-22 DIAGNOSIS — E876 Hypokalemia: Secondary | ICD-10-CM | POA: Diagnosis not present

## 2014-10-22 DIAGNOSIS — J209 Acute bronchitis, unspecified: Secondary | ICD-10-CM | POA: Diagnosis not present

## 2014-10-22 DIAGNOSIS — Z794 Long term (current) use of insulin: Secondary | ICD-10-CM | POA: Diagnosis not present

## 2014-10-22 DIAGNOSIS — J9611 Chronic respiratory failure with hypoxia: Secondary | ICD-10-CM | POA: Diagnosis present

## 2014-10-22 DIAGNOSIS — E669 Obesity, unspecified: Secondary | ICD-10-CM

## 2014-10-22 DIAGNOSIS — R05 Cough: Secondary | ICD-10-CM | POA: Diagnosis not present

## 2014-10-22 DIAGNOSIS — N182 Chronic kidney disease, stage 2 (mild): Secondary | ICD-10-CM | POA: Diagnosis present

## 2014-10-22 DIAGNOSIS — I10 Essential (primary) hypertension: Secondary | ICD-10-CM | POA: Diagnosis not present

## 2014-10-22 DIAGNOSIS — R531 Weakness: Secondary | ICD-10-CM | POA: Diagnosis not present

## 2014-10-22 DIAGNOSIS — D860 Sarcoidosis of lung: Secondary | ICD-10-CM | POA: Diagnosis not present

## 2014-10-22 DIAGNOSIS — E871 Hypo-osmolality and hyponatremia: Secondary | ICD-10-CM | POA: Diagnosis not present

## 2014-10-22 DIAGNOSIS — J9610 Chronic respiratory failure, unspecified whether with hypoxia or hypercapnia: Secondary | ICD-10-CM | POA: Diagnosis not present

## 2014-10-22 DIAGNOSIS — M7989 Other specified soft tissue disorders: Secondary | ICD-10-CM | POA: Diagnosis not present

## 2014-10-22 DIAGNOSIS — Z7982 Long term (current) use of aspirin: Secondary | ICD-10-CM | POA: Diagnosis not present

## 2014-10-22 DIAGNOSIS — R6883 Chills (without fever): Secondary | ICD-10-CM | POA: Diagnosis not present

## 2014-10-22 DIAGNOSIS — G2581 Restless legs syndrome: Secondary | ICD-10-CM | POA: Diagnosis not present

## 2014-10-22 DIAGNOSIS — F329 Major depressive disorder, single episode, unspecified: Secondary | ICD-10-CM | POA: Diagnosis not present

## 2014-10-22 DIAGNOSIS — I509 Heart failure, unspecified: Secondary | ICD-10-CM | POA: Diagnosis not present

## 2014-10-22 DIAGNOSIS — G43909 Migraine, unspecified, not intractable, without status migrainosus: Secondary | ICD-10-CM | POA: Diagnosis not present

## 2014-10-22 DIAGNOSIS — Z23 Encounter for immunization: Secondary | ICD-10-CM | POA: Diagnosis not present

## 2014-10-22 DIAGNOSIS — E119 Type 2 diabetes mellitus without complications: Secondary | ICD-10-CM

## 2014-10-22 DIAGNOSIS — Z880 Allergy status to penicillin: Secondary | ICD-10-CM | POA: Diagnosis not present

## 2014-10-22 LAB — CBC
HEMATOCRIT: 32.2 % — AB (ref 36.0–46.0)
HEMOGLOBIN: 10.6 g/dL — AB (ref 12.0–15.0)
MCH: 31.7 pg (ref 26.0–34.0)
MCHC: 32.9 g/dL (ref 30.0–36.0)
MCV: 96.4 fL (ref 78.0–100.0)
Platelets: 185 10*3/uL (ref 150–400)
RBC: 3.34 MIL/uL — ABNORMAL LOW (ref 3.87–5.11)
RDW: 14.6 % (ref 11.5–15.5)
WBC: 5.1 10*3/uL (ref 4.0–10.5)

## 2014-10-22 LAB — URINALYSIS, ROUTINE W REFLEX MICROSCOPIC
BILIRUBIN URINE: NEGATIVE
GLUCOSE, UA: 250 mg/dL — AB
HGB URINE DIPSTICK: NEGATIVE
KETONES UR: NEGATIVE mg/dL
Nitrite: NEGATIVE
PH: 6.5 (ref 5.0–8.0)
Protein, ur: NEGATIVE mg/dL
Specific Gravity, Urine: 1.01 (ref 1.005–1.030)
Urobilinogen, UA: 1 mg/dL (ref 0.0–1.0)

## 2014-10-22 LAB — COMPREHENSIVE METABOLIC PANEL
ALBUMIN: 3.2 g/dL — AB (ref 3.5–5.0)
ALK PHOS: 62 U/L (ref 38–126)
ALT: 29 U/L (ref 14–54)
AST: 38 U/L (ref 15–41)
Anion gap: 9 (ref 5–15)
BUN: 10 mg/dL (ref 6–20)
CALCIUM: 8.8 mg/dL — AB (ref 8.9–10.3)
CO2: 32 mmol/L (ref 22–32)
Chloride: 90 mmol/L — ABNORMAL LOW (ref 101–111)
Creatinine, Ser: 1.09 mg/dL — ABNORMAL HIGH (ref 0.44–1.00)
GFR calc Af Amer: 57 mL/min — ABNORMAL LOW (ref >60)
GFR, EST NON AFRICAN AMERICAN: 49 mL/min — AB (ref >60)
GLUCOSE: 208 mg/dL — AB (ref 65–99)
Potassium: 3.4 mmol/L — ABNORMAL LOW (ref 3.5–5.1)
Sodium: 131 mmol/L — ABNORMAL LOW (ref 135–145)
Total Bilirubin: 0.4 mg/dL (ref 0.3–1.2)
Total Protein: 6.7 g/dL (ref 6.5–8.1)

## 2014-10-22 LAB — URINE MICROSCOPIC-ADD ON

## 2014-10-22 LAB — VALPROIC ACID LEVEL: Valproic Acid Lvl: 79 ug/mL (ref 50.0–100.0)

## 2014-10-22 LAB — LACTIC ACID, PLASMA: LACTIC ACID, VENOUS: 2.3 mmol/L — AB (ref 0.5–2.0)

## 2014-10-22 LAB — I-STAT CG4 LACTIC ACID, ED: Lactic Acid, Venous: 2.48 mmol/L (ref 0.5–2.0)

## 2014-10-22 LAB — GLUCOSE, CAPILLARY
GLUCOSE-CAPILLARY: 412 mg/dL — AB (ref 65–99)
Glucose-Capillary: 232 mg/dL — ABNORMAL HIGH (ref 65–99)

## 2014-10-22 LAB — BRAIN NATRIURETIC PEPTIDE: B Natriuretic Peptide: 107.8 pg/mL — ABNORMAL HIGH (ref 0.0–100.0)

## 2014-10-22 MED ORDER — SODIUM CHLORIDE 0.9 % IV SOLN: INTRAVENOUS | Status: DC

## 2014-10-22 MED ORDER — INSULIN ASPART 100 UNIT/ML ~~LOC~~ SOLN
0.0000 [IU] | Freq: Every day | SUBCUTANEOUS | Status: DC
Start: 2014-10-22 — End: 2014-10-24
  Administered 2014-10-22 – 2014-10-23 (×3): 5 [IU] via SUBCUTANEOUS

## 2014-10-22 MED ORDER — FLUTICASONE PROPIONATE 50 MCG/ACT NA SUSP
2.0000 | Freq: Every day | NASAL | Status: DC
  Administered 2014-10-22 – 2014-10-23 (×2): 2 via NASAL
  Filled 2014-10-22: qty 16

## 2014-10-22 MED ORDER — SODIUM CHLORIDE 0.9 % IV BOLUS (SEPSIS)
1000.0000 mL | Freq: Once | INTRAVENOUS | Status: AC
Start: 2014-10-22 — End: 2014-10-22
  Administered 2014-10-22: 1000 mL via INTRAVENOUS

## 2014-10-22 MED ORDER — INFLUENZA VAC SPLIT QUAD 0.5 ML IM SUSY
0.5000 mL | PREFILLED_SYRINGE | INTRAMUSCULAR | Status: AC
  Administered 2014-10-23: 0.5 mL via INTRAMUSCULAR
  Filled 2014-10-22: qty 0.5

## 2014-10-22 MED ORDER — DIVALPROEX SODIUM 500 MG PO DR TAB
1000.0000 mg | DELAYED_RELEASE_TABLET | Freq: Every evening | ORAL | Status: DC
  Administered 2014-10-23: 1000 mg via ORAL
  Filled 2014-10-22 (×3): qty 2

## 2014-10-22 MED ORDER — ALBUTEROL SULFATE (2.5 MG/3ML) 0.083% IN NEBU
2.5000 mg | INHALATION_SOLUTION | Freq: Three times a day (TID) | RESPIRATORY_TRACT | Status: DC
  Administered 2014-10-22 – 2014-10-24 (×5): 2.5 mg via RESPIRATORY_TRACT
  Filled 2014-10-22 (×7): qty 3

## 2014-10-22 MED ORDER — TIOTROPIUM BROMIDE-OLODATEROL 2.5-2.5 MCG/ACT IN AERS
2.0000 | INHALATION_SPRAY | Freq: Every day | RESPIRATORY_TRACT | Status: DC
  Administered 2014-10-23 – 2014-10-24 (×2): 2 via RESPIRATORY_TRACT

## 2014-10-22 MED ORDER — DEXTROSE 5 % IV SOLN
2.0000 g | INTRAVENOUS | Status: AC
  Administered 2014-10-22: 2 g via INTRAVENOUS
  Filled 2014-10-22 (×2): qty 2

## 2014-10-22 MED ORDER — ENOXAPARIN SODIUM 40 MG/0.4ML ~~LOC~~ SOLN
40.0000 mg | SUBCUTANEOUS | Status: DC
  Administered 2014-10-22 – 2014-10-23 (×2): 40 mg via SUBCUTANEOUS
  Filled 2014-10-22 (×3): qty 0.4

## 2014-10-22 MED ORDER — METRONIDAZOLE IN NACL 5-0.79 MG/ML-% IV SOLN
500.0000 mg | Freq: Once | INTRAVENOUS | Status: DC
  Administered 2014-10-22: 500 mg via INTRAVENOUS
  Filled 2014-10-22: qty 100

## 2014-10-22 MED ORDER — METRONIDAZOLE IN NACL 5-0.79 MG/ML-% IV SOLN: 500.0000 mg | Freq: Three times a day (TID) | INTRAVENOUS | Status: DC

## 2014-10-22 MED ORDER — POTASSIUM CHLORIDE ER 10 MEQ PO TBCR
20.0000 meq | EXTENDED_RELEASE_TABLET | Freq: Every day | ORAL | Status: DC
  Administered 2014-10-23 – 2014-10-24 (×2): 20 meq via ORAL
  Filled 2014-10-22 (×5): qty 2

## 2014-10-22 MED ORDER — TORSEMIDE 20 MG PO TABS: 50.0000 mg | ORAL_TABLET | Freq: Two times a day (BID) | ORAL | Status: DC

## 2014-10-22 MED ORDER — INSULIN ASPART 100 UNIT/ML ~~LOC~~ SOLN
0.0000 [IU] | Freq: Three times a day (TID) | SUBCUTANEOUS | Status: DC
Start: 2014-10-23 — End: 2014-10-24
  Administered 2014-10-23: 7 [IU] via SUBCUTANEOUS
  Administered 2014-10-23: 5 [IU] via SUBCUTANEOUS
  Administered 2014-10-24: 7 [IU] via SUBCUTANEOUS
  Administered 2014-10-24: 3 [IU] via SUBCUTANEOUS
  Filled 2014-10-22: qty 1

## 2014-10-22 MED ORDER — INSULIN ASPART 100 UNIT/ML ~~LOC~~ SOLN: 0.0000 [IU] | Freq: Three times a day (TID) | SUBCUTANEOUS | Status: DC

## 2014-10-22 MED ORDER — SPIRONOLACTONE 25 MG PO TABS: 25.0000 mg | ORAL_TABLET | Freq: Two times a day (BID) | ORAL | Status: DC

## 2014-10-22 MED ORDER — CEFAZOLIN SODIUM 1-5 GM-% IV SOLN
1.0000 g | Freq: Three times a day (TID) | INTRAVENOUS | Status: DC
  Administered 2014-10-22 – 2014-10-23 (×3): 1 g via INTRAVENOUS
  Filled 2014-10-22 (×5): qty 50

## 2014-10-22 MED ORDER — ERGOCALCIFEROL 1.25 MG (50000 UT) PO CAPS
50000.0000 [IU] | ORAL_CAPSULE | ORAL | Status: DC
  Administered 2014-10-22: 50000 [IU] via ORAL
  Filled 2014-10-22: qty 1

## 2014-10-22 MED ORDER — CITALOPRAM HYDROBROMIDE 40 MG PO TABS
40.0000 mg | ORAL_TABLET | Freq: Every day | ORAL | Status: DC
Start: 2014-10-22 — End: 2014-10-24
  Administered 2014-10-23 – 2014-10-24 (×2): 40 mg via ORAL
  Filled 2014-10-22 (×2): qty 1

## 2014-10-22 MED ORDER — VANCOMYCIN HCL 10 G IV SOLR
1500.0000 mg | Freq: Once | INTRAVENOUS | Status: DC
  Administered 2014-10-22: 1500 mg via INTRAVENOUS
  Filled 2014-10-22: qty 1500

## 2014-10-22 MED ORDER — VANCOMYCIN HCL IN DEXTROSE 1-5 GM/200ML-% IV SOLN: 1000.0000 mg | Freq: Two times a day (BID) | INTRAVENOUS | Status: DC

## 2014-10-22 MED ORDER — DEXTROSE 5 % IV SOLN
1.0000 g | Freq: Three times a day (TID) | INTRAVENOUS | Status: DC
  Filled 2014-10-22 (×2): qty 1

## 2014-10-22 MED ORDER — ASPIRIN 81 MG PO CHEW
81.0000 mg | CHEWABLE_TABLET | Freq: Every day | ORAL | Status: DC
Start: 2014-10-22 — End: 2014-10-24
  Administered 2014-10-22 – 2014-10-24 (×3): 81 mg via ORAL
  Filled 2014-10-22 (×3): qty 1

## 2014-10-22 MED ORDER — SODIUM CHLORIDE 0.9 % IV SOLN
INTRAVENOUS | Status: DC
  Administered 2014-10-22 – 2014-10-23 (×2): via INTRAVENOUS

## 2014-10-22 MED ORDER — ROPINIROLE HCL 1 MG PO TABS
3.0000 mg | ORAL_TABLET | Freq: Two times a day (BID) | ORAL | Status: DC
  Administered 2014-10-22 – 2014-10-24 (×4): 3 mg via ORAL
  Filled 2014-10-22 (×6): qty 3

## 2014-10-22 MED ORDER — VANCOMYCIN HCL IN DEXTROSE 1-5 GM/200ML-% IV SOLN: 1000.0000 mg | Freq: Once | INTRAVENOUS | Status: DC

## 2014-10-22 MED ORDER — INSULIN ASPART 100 UNIT/ML ~~LOC~~ SOLN: 9.0000 [IU] | Freq: Once | SUBCUTANEOUS | Status: DC

## 2014-10-22 MED ORDER — PANTOPRAZOLE SODIUM 40 MG PO TBEC
40.0000 mg | DELAYED_RELEASE_TABLET | Freq: Every day | ORAL | Status: DC
  Administered 2014-10-22 – 2014-10-24 (×3): 40 mg via ORAL
  Filled 2014-10-22 (×3): qty 1

## 2014-10-22 MED ORDER — PREDNISONE 20 MG PO TABS
20.0000 mg | ORAL_TABLET | Freq: Every day | ORAL | Status: DC
  Administered 2014-10-22 – 2014-10-24 (×3): 20 mg via ORAL
  Filled 2014-10-22 (×3): qty 1

## 2014-10-22 MED ORDER — TRAMADOL HCL 50 MG PO TABS
50.0000 mg | ORAL_TABLET | Freq: Two times a day (BID) | ORAL | Status: DC | PRN
  Administered 2014-10-22: 50 mg via ORAL
  Filled 2014-10-22: qty 1

## 2014-10-22 MED ORDER — INSULIN DETEMIR 100 UNIT/ML ~~LOC~~ SOLN
25.0000 [IU] | Freq: Every day | SUBCUTANEOUS | Status: DC
  Administered 2014-10-23 – 2014-10-24 (×2): 25 [IU] via SUBCUTANEOUS
  Filled 2014-10-22 (×2): qty 0.25

## 2014-10-22 MED ORDER — GABAPENTIN 300 MG PO CAPS
300.0000 mg | ORAL_CAPSULE | Freq: Every day | ORAL | Status: DC
  Administered 2014-10-22 – 2014-10-23 (×2): 300 mg via ORAL
  Filled 2014-10-22 (×2): qty 1

## 2014-10-22 NOTE — ED Notes (Signed)
MD at bedside. 

## 2014-10-22 NOTE — Progress Notes (Signed)
ANTIBIOTIC CONSULT NOTE - INITIAL  Pharmacy Consult for vancomycin + aztreonam + flagyl Indication: cellulitis  Allergies  Allergen Reactions  . Clindamycin/Lincomycin Swelling and Rash  . Penicillins Swelling and Rash     high fever  . Tape Itching and Rash    Paper tape is ok.    Patient Measurements:   Adjusted Body Weight:   Vital Signs: Temp: 98.1 F (36.7 C) (09/02 1146) Temp Source: Oral (09/02 1146) BP: 104/60 mmHg (09/02 1241) Pulse Rate: 96 (09/02 1241) Intake/Output from previous day:   Intake/Output from this shift:    Labs:  Recent Labs  10/22/14 1200  WBC 5.1  HGB 10.6*  PLT 185  CREATININE 1.09*   CrCl cannot be calculated (Unknown ideal weight.). No results for input(s): VANCOTROUGH, VANCOPEAK, VANCORANDOM, GENTTROUGH, GENTPEAK, GENTRANDOM, TOBRATROUGH, TOBRAPEAK, TOBRARND, AMIKACINPEAK, AMIKACINTROU, AMIKACIN in the last 72 hours.   Microbiology: No results found for this or any previous visit (from the past 720 hour(s)).  Medical History: Past Medical History  Diagnosis Date  . HTN (hypertension)   . Diabetes mellitus   . Fibrosis of lung   . CHF (congestive heart failure)   . GERD (gastroesophageal reflux disease)   . Chronic headache     migraines  . UTI (urinary tract infection)     " MULTIPLE TIMES THIS PAST YEAR "  . Arthritis   . Anemia   . Cataract   . COPD (chronic obstructive pulmonary disease)   . Depression   . Osteoporosis   . Hyperlipidemia     Medications:  Anti-infectives    Start     Dose/Rate Route Frequency Ordered Stop   10/23/14 0130  vancomycin (VANCOCIN) IVPB 1000 mg/200 mL premix     1,000 mg 200 mL/hr over 60 Minutes Intravenous Every 12 hours 10/22/14 1300     10/22/14 2200  aztreonam (AZACTAM) 1 g in dextrose 5 % 50 mL IVPB     1 g 100 mL/hr over 30 Minutes Intravenous Every 8 hours 10/22/14 1300     10/22/14 2200  metroNIDAZOLE (FLAGYL) IVPB 500 mg     500 mg 100 mL/hr over 60 Minutes Intravenous  Every 8 hours 10/22/14 1300     10/22/14 1300  aztreonam (AZACTAM) 2 g in dextrose 5 % 50 mL IVPB     2 g 100 mL/hr over 30 Minutes Intravenous STAT 10/22/14 1253 10/23/14 1300   10/22/14 1300  metroNIDAZOLE (FLAGYL) IVPB 500 mg     500 mg 100 mL/hr over 60 Minutes Intravenous  Once 10/22/14 1253     10/22/14 1300  vancomycin (VANCOCIN) IVPB 1000 mg/200 mL premix  Status:  Discontinued     1,000 mg 200 mL/hr over 60 Minutes Intravenous  Once 10/22/14 1253 10/22/14 1255   10/22/14 1300  vancomycin (VANCOCIN) 1,500 mg in sodium chloride 0.9 % 500 mL IVPB     1,500 mg 250 mL/hr over 120 Minutes Intravenous  Once 10/22/14 1255       Assessment: 22 yof presented to the ED with fatigue and leg swelling. To start empiric vancomycin + aztreonam and flagyl for cellulitis. Pt is afebrile and WBC is WNL. Scr 1.09. First doses ordered by EDP.   Vanc 9/2>> Aztreo 9/2>> Flagyl 9/2>>   Goal of Therapy:  Vancomycin trough level 10-15 mcg/ml  Plan:  - Aztreonam 1gm IV Q8H  - Change vancomycin to 1500mg  IV x 1 then 1gm IV Q12H  - Flagyl 500mg  IV Q8H  - F/u renal fxn,  C&S, clinical status and trough at [facility], Rande Lawman 10/22/2014,1:00 PM

## 2014-10-22 NOTE — ED Notes (Signed)
Patient returned to room.  Flagyl stopped.  Monitor reapplied.

## 2014-10-22 NOTE — Progress Notes (Signed)
CRITICAL VALUE ALERT  Critical value received:  Lactic acid 2.3  Date of notification:  10/22/14  Time of notification:  1920  Critical value read back:Yes.    Nurse who received alert:  Zenon Mayo, RN   MD notified (1st page):  On-call MD internal medicine teaching service  Time of first page:  [phone]  Responding MD:  Dr. Lindon Romp  Time MD responded:  [phone]

## 2014-10-22 NOTE — ED Notes (Signed)
Pt presents via POV c/o fatigue and confusion x 1 week and also BL LE redness and swelling since Monday.  Pt reports going to MD office yesterday and MD wanted her to come get an MRI and cxray, also concerned about her legs.  Pt reports chills this week and feeling "bad" so she doesn't get out of the bed.  Pt a x 4, NAD, speech clear, no facial droop.

## 2014-10-22 NOTE — ED Provider Notes (Signed)
CSN: [id number]     Arrival date & time 10/22/14  1128 History   First MD Initiated Contact with Patient 10/22/14 1145     Chief Complaint  Patient presents with  . Fatigue  . Leg Swelling     (Consider location/radiation/quality/duration/timing/severity/associated sxs/prior Treatment) The history is provided by the patient and a relative.  Patient w hx htn, dm, sarcoidosis on home o2, presents c/o generalized weakness, confusion, for the past week. Saw pcp w same yesterday, and was told to go to ER for further workup.  Patient denies any focal or unilateral numbness or weakness. No change in speech or vision. Family members notes disorientation with thinking it has bee Friday all week. Pt sates chills intermittent this week, with 'feeling bad', and spending most of the time in bed. Compliant w normal meds, denies any recent change in meds or doses. No fever. +increase in non prod cough. No sore throat or other uri c/o. +swelling bilateral legs w new erythema anteriorly.     Past Medical History  Diagnosis Date  . HTN (hypertension)   . Diabetes mellitus   . Fibrosis of lung   . CHF (congestive heart failure)   . GERD (gastroesophageal reflux disease)   . Chronic headache     migraines  . UTI (urinary tract infection)     " MULTIPLE TIMES THIS PAST YEAR "  . Arthritis   . Anemia   . Cataract   . COPD (chronic obstructive pulmonary disease)   . Depression   . Osteoporosis   . Hyperlipidemia    Past Surgical History  Procedure Laterality Date  . Neck surgery  1987  . Partial hysterectomy    . Tubal ligation    . Cataract extraction    . Abdominal hysterectomy     Family History  Problem Relation Age of Onset  . Prostate cancer Brother   . Heart disease Brother     MI at age 42  . Stroke Mother     stroke at age 66  . Diabetes Mother   . Asthma Son   . Emphysema Father   . Heart disease Father   . Lung cancer Sister   . Heart disease Sister     MI at 63  . Heart  disease Brother     all three brothers have had MI   Social History  Substance Use Topics  . Smoking status: Former Smoker -- 1.00 packs/day for 8 years    Types: Cigarettes    Quit date: 02/20/1968  . Smokeless tobacco: Never Used  . Alcohol Use: No   OB History    No data available     Review of Systems  Constitutional: Positive for chills. Negative for fever.  HENT: Negative for sore throat.   Eyes: Negative for visual disturbance.  Respiratory: Positive for cough. Negative for shortness of breath.   Cardiovascular: Negative for chest pain.  Gastrointestinal: Negative for vomiting, abdominal pain and diarrhea.  Endocrine: Negative for polyuria.  Genitourinary: Negative for dysuria and flank pain.  Musculoskeletal: Negative for back pain and neck pain.  Skin: Negative for rash.  Neurological: Negative for headaches.  Hematological: Does not bruise/bleed easily.  Psychiatric/Behavioral: Positive for confusion.      Allergies  Clindamycin/lincomycin; Penicillins; and Tape  Home Medications   Prior to Admission medications   Medication Sig Start Date End Date Taking? Authorizing Provider  albuterol (PROVENTIL) (2.5 MG/3ML) 0.083% nebulizer solution Take 3 mLs (2.5 mg total) by nebulization  3 (three) times daily. 09/14/14   Collene Gobble, MD  aspirin 81 MG chewable tablet Chew 1 tablet (81 mg total) by mouth daily. 09/07/14   Lucious Groves, DO  citalopram (CELEXA) 40 MG tablet Take 40 mg by mouth daily.    Historical Provider, MD  Colesevelam HCl 3.75 G PACK Take 1 packet by mouth every evening.    Historical Provider, MD  divalproex (DEPAKOTE) 500 MG EC tablet Take 1,000 mg by mouth every evening.     Historical Provider, MD  ergocalciferol (VITAMIN D2) 50000 UNITS capsule Take 50,000 Units by mouth every Friday.    Historical Provider, MD  fluticasone (FLONASE) 50 MCG/ACT nasal spray Place 2 sprays into both nostrils daily. 05/18/14   Collene Gobble, MD  furosemide  (LASIX) 40 MG tablet 2 tabs by mouth once daily    Historical Provider, MD  gabapentin (NEURONTIN) 300 MG capsule Take 300 mg by mouth at bedtime.    Historical Provider, MD  HUMALOG KWIKPEN 100 UNIT/ML KiwkPen Inject 8 Units into the skin 3 (three) times daily.  08/09/14   Historical Provider, MD  ibuprofen (ADVIL,MOTRIN) 200 MG tablet Take 400 mg by mouth every 6 (six) hours as needed for moderate pain.    Historical Provider, MD  LEVEMIR FLEXTOUCH 100 UNIT/ML Pen Inject 32 Units into the skin daily with breakfast.  08/09/14   Historical Provider, MD  loratadine (CLARITIN) 10 MG tablet Take 1 tablet (10 mg total) by mouth daily. 02/05/14   Collene Gobble, MD  pantoprazole (PROTONIX) 40 MG tablet Take 40 mg by mouth daily.     Historical Provider, MD  potassium chloride (KLOR-CON) 10 MEQ CR tablet Take 20 mEq by mouth daily before breakfast.     Historical Provider, MD  predniSONE (DELTASONE) 20 MG tablet Take 40 mg by mouth daily. 09/02/14   Historical Provider, MD  PROAIR HFA 108 (90 BASE) MCG/ACT inhaler Use 2 puffs every 6 hours  as needed for wheezing or  shortness of breath 08/09/14   Collene Gobble, MD  rOPINIRole (REQUIP) 3 MG tablet Take 3 mg by mouth 2 (two) times daily.      Historical Provider, MD  spironolactone (ALDACTONE) 25 MG tablet Take 1 tablet by mouth 2 (two) times daily. 11/12/13   Historical Provider, MD  tetrahydrozoline-zinc (VISINE-AC) 0.05-0.25 % ophthalmic solution Place 1 drop into both eyes daily as needed (dry eyes).    Historical Provider, MD  Tiotropium Bromide-Olodaterol (STIOLTO RESPIMAT) 2.5-2.5 MCG/ACT AERS Inhale 2 puffs into the lungs daily. 05/18/14   Collene Gobble, MD  traMADol Veatrice Bourbon) 50 MG tablet Takes 1 tablet in the morning and 2 tablets at night 05/23/11   Tanda Rockers, MD   BP 125/63 mmHg  Pulse 93  Temp(Src) 98.1 F (36.7 C) (Oral)  Resp 17  SpO2 100% Physical Exam  Constitutional: She appears well-developed and well-nourished. No distress.  HENT:   Head: Atraumatic.  Nose: Nose normal.  Mouth/Throat: Oropharynx is clear and moist.  Eyes: Conjunctivae are normal. Pupils are equal, round, and reactive to light. No scleral icterus.  Neck: Normal range of motion. Neck supple. No tracheal deviation present. No thyromegaly present.  No stiffness or rigidity. No bruits.   Cardiovascular: Normal rate, regular rhythm, normal heart sounds and intact distal pulses.  Exam reveals no gallop and no friction rub.   No murmur heard. Pulmonary/Chest: Effort normal and breath sounds normal. No respiratory distress.  Abdominal: Soft. Normal appearance and bowel  sounds are normal. She exhibits no distension and no mass. There is no tenderness. There is no rebound and no guarding.  Genitourinary:  No cva tenderness  Musculoskeletal:  Bilateral lower leg edema to knees. Erythema, and increased warmth anterior lower legs esp left.   Neurological: She is alert. No cranial nerve deficit.  Speech clear/fluent. Oriented to person/place. No pronator drift. Motor intact bil, stre 5/5. sens grossly intact.   Skin: Skin is warm and dry. No rash noted. She is not diaphoretic.  Psychiatric: She has a normal mood and affect.  Nursing note and vitals reviewed.   ED Course  Procedures (including critical care time) Labs Review  Results for orders placed or performed during the hospital encounter of 10/22/14  CBC  Result Value Ref Range   WBC 5.1 4.0 - 10.5 K/uL   RBC 3.34 (L) 3.87 - 5.11 MIL/uL   Hemoglobin 10.6 (L) 12.0 - 15.0 g/dL   HCT 32.2 (L) 36.0 - 46.0 %   MCV 96.4 78.0 - 100.0 fL   MCH 31.7 26.0 - 34.0 pg   MCHC 32.9 30.0 - 36.0 g/dL   RDW 14.6 11.5 - 15.5 %   Platelets 185 150 - 400 K/uL  Comprehensive metabolic panel  Result Value Ref Range   Sodium 131 (L) 135 - 145 mmol/L   Potassium 3.4 (L) 3.5 - 5.1 mmol/L   Chloride 90 (L) 101 - 111 mmol/L   CO2 32 22 - 32 mmol/L   Glucose, Bld 208 (H) 65 - 99 mg/dL   BUN 10 6 - 20 mg/dL   Creatinine,  Ser 1.09 (H) 0.44 - 1.00 mg/dL   Calcium 8.8 (L) 8.9 - 10.3 mg/dL   Total Protein 6.7 6.5 - 8.1 g/dL   Albumin 3.2 (L) 3.5 - 5.0 g/dL   AST 38 15 - 41 U/L   ALT 29 14 - 54 U/L   Alkaline Phosphatase 62 38 - 126 U/L   Total Bilirubin 0.4 0.3 - 1.2 mg/dL   GFR calc non Af Amer 49 (L) >60 mL/min   GFR calc Af Amer 57 (L) >60 mL/min   Anion gap 9 5 - 15  I-Stat CG4 Lactic Acid, ED  Result Value Ref Range   Lactic Acid, Venous 2.48 (HH) 0.5 - 2.0 mmol/L   Comment NOTIFIED PHYSICIAN    Dg Chest 2 View  10/22/2014   CLINICAL DATA:  Cough  EXAM: CHEST  2 VIEW  COMPARISON:  CT 09/30/2014, chest radiograph 09/21/2014  FINDINGS: The patient is rotated to the left. Mild enlargement of the cardiac silhouette is noted. No pleural effusion. Diffusely prominent interstitial reticular opacities are noted. No focal pulmonary opacity. No pleural effusion. Bones are subjectively osteopenic. No acute osseous abnormality.  IMPRESSION: Diffusely prominent nonspecific interstitial lung markings without focal acute finding.   Electronically Signed   By: Conchita Paris M.D.   On: 10/22/2014 12:38   Ct Chest High Resolution  10/01/2014   CLINICAL DATA:  74 year old female with history of sarcoidosis. On oxygen for chronic shortness of breath. History of congestive heart failure.  EXAM: CT CHEST WITHOUT CONTRAST  TECHNIQUE: Multidetector CT imaging of the chest was performed following the standard protocol without intravenous contrast. High resolution imaging of the lungs, as well as inspiratory and expiratory imaging, was performed.  COMPARISON:  Chest CT 07/27/2013.  FINDINGS: Mediastinum/Lymph Nodes: Heart size is borderline enlarged with left atrial dilatation. There is no significant pericardial fluid, thickening or pericardial calcification. There is atherosclerosis of  the thoracic aorta, the great vessels of the mediastinum and the coronary arteries, including calcified atherosclerotic plaque in the left anterior  descending, left circumflex and right coronary arteries. Calcifications of the aortic valve. Severe calcifications of the mitral annulus. No pathologically enlarged mediastinal or hilar lymph nodes. Please note that accurate exclusion of hilar adenopathy is limited on noncontrast CT scans. Esophagus is unremarkable in appearance. No axillary lymphadenopathy.  Lungs/Pleura: Again noted is a pattern of relatively diffuse ground-glass attenuation throughout the lungs bilaterally with scattered areas of septal thickening and mild peripheral bronchiolectasis. Very mild peripheral subpleural reticulation is also noted, predominantly in the lower lobes of the lungs. No frank honeycombing identified. Inspiratory and expiratory imaging demonstrates moderate air trapping, indicative of small airways disease. There are several more focal nodular appearing areas of ground-glass attenuation in the lungs bilaterally, the largest of which is in the posterior aspect of the left upper lobe on image 18 of series 5, which currently measures approximately 2.2 x 2.4 cm. This ground-glass attenuation area appears slightly larger than prior studies, however, this is less solid in appearance than prior examinations (the central solid component of 6 mm on today's examination, versus nearly 12 mm on the prior study when measured in a similar fashion). The other nodular ground-glass attenuation areas are in the right upper lobe (image 13 of series 5) measuring 7 x 10 mm, in the right upper lobe (image 9 of series 5) measuring 11 x 12 mm, in the left upper lobe (image 24 of series 5) measuring 12 x 8 mm, and in the medial aspect of the right lower lobe (image 33 of series 5) measuring 9 x 9 mm, all of which are similar to prior examinations. No other new suspicious appearing pulmonary nodules or masses are noted.  Upper Abdomen: Atherosclerosis.  Musculoskeletal/Soft Tissues: There are no aggressive appearing lytic or blastic lesions noted in  the visualized portions of the skeleton.  IMPRESSION: 1. The appearance of the lungs is very unusual, but is similar to prior examinations, as detailed above. There is a spectrum of findings could be seen in the setting of sarcoidosis, as sarcoidosis has many appearances, however, the findings are not classical for sarcoidosis. Findings may alternatively reflect an interstitial lung disease such as nonspecific interstitial pneumonia (NSIP). 2. Several more focal areas of somewhat nodular appearing ground-glass attenuation are noted in the lungs bilaterally, and are generally stable in size and appearance to prior studies. The exception to this is the largest lesion in the posterior aspect of the left upper lobe which appears slightly larger than the prior examination overall, but is progressively less solid in appearance over several prior examinations. While this is favored to be a benign lesion, continued attention on followup studies is recommended for all of these lesions to exclude the possibility of a slow-growing neoplasm such as adenocarcinoma. 3. Atherosclerosis, including three-vessel coronary artery disease. Assessment for potential risk factor modification, dietary therapy or pharmacologic therapy may be warranted, if clinically indicated. 4. There are calcifications of the mitral valve and annulus, and aortic valve. Echocardiographic correlation for evaluation of potential valvular dysfunction may be warranted if clinically indicated.   Electronically Signed   By: Vinnie Langton M.D.   On: 10/01/2014 08:58       I have personally reviewed and evaluated these images and lab results as part of my medical decision-making.   EKG Interpretation   Date/Time:  Friday October 22 2014 11:45:19 EDT Ventricular Rate:  105 PR Interval:  125 QRS Duration: 75 QT Interval:  350 QTC Calculation: 463 R Axis:   70 Text Interpretation:  Sinus tachycardia Borderline repolarization  abnormality No  significant change since last tracing Confirmed by Zachry Hopfensperger   MD, Lennette Bihari ([id number]) on 10/22/2014 11:52:45 AM      MDM   Iv ns. Labs. Cxr.  Reviewed nursing notes and prior charts for additional history.   Lactate 2.4.  Iv ns bolus.  Blood cultures.  Given chills, elevated lactate, lower ext warmth/erythema, suspect cellulitis.  After blood cxs, iv abx given.  Will repeat lactate.  Med service to admit.    Lajean Saver, MD 10/22/14 1255

## 2014-10-22 NOTE — H&P (Signed)
Date: 10/22/2014               Patient Name:  Penny Curry MRN: [id number]  DOB: [birth year] Age / Sex: 74 y.o., female   PCP: Ronita Hipps, MD         Medical Service: Internal Medicine Teaching Service         Attending Physician: Dr. Sid Falcon, MD    First Contact: Dr. Charlynn Grimes Pager: [phone]  Second Contact: Dr. Naaman Plummer Pager: [phone]       After Hours (After 5p/  First Contact Pager: [phone]  weekends / holidays): Second Contact Pager: [phone]   Chief Complaint: Weakness  History of Present Illness: Penny Curry is a 74 year old woman with a PMH history of HTN, insulin dependent Type 2 DM, Sarcoidosis with chronic respiratory failure presents to [facility] ED with complaints of generalized weakness, chills, loss of balance and rash on her bilateral lower extremities for 1 week. Patient was seen by her PCP yesterday for same complaints and told to come to ED for further evaluation. She reports that she has had subjective fevers and chills for the past week. Reports she has had some loss of balance 2/2 generalized weakness in her legs with several falls in the past several days. Denies hitting her head. Does report difficulty with word findings and some slurred speech but does not have any slurred speech today. No focal deficits though does reports some blurred vision. She has had a rash on her bilateral lower extremities for the past week. Denies any trauma to the area, any wounds, or pain. Reports she had Dopplers done on both legs without any evidence of DVT. Also states she has had a non-productive cough during this time period as well.   Per ED note, family members reported some confusion for the past week with the patient believing it has been Friday all week. No family members at bedside during evaluation. Patient has been on Prednisone 20 mg daily for > 1 month. Reports running out 2-3 days ago. Recently was started on Torsemide.   CXR with diffusely prominent non-specific interstitial  lung marking with no focal acute findings. CT head   Meds: Current Facility-Administered Medications  Medication Dose Route Frequency Provider Last Rate Last Dose  . 0.9 %  sodium chloride infusion   Intravenous Continuous Lajean Saver, MD   0  at 10/22/14 1318  . albuterol (PROVENTIL) (2.5 MG/3ML) 0.083% nebulizer solution 2.5 mg  2.5 mg Nebulization TID Tasrif Ahmed, MD   2.5 mg at 10/22/14 1635  . aspirin chewable tablet 81 mg  81 mg Oral Daily Tasrif Ahmed, MD   81 mg at 10/22/14 1635  . ceFAZolin (ANCEF) IVPB 1 g/50 mL premix  1 g Intravenous 3 times per day Maryellen Pile, MD 100 mL/hr at 10/22/14 1703 1 g at 10/22/14 1703  . citalopram (CELEXA) tablet 40 mg  40 mg Oral Daily Tasrif Ahmed, MD      . divalproex (DEPAKOTE) DR tablet 1,000 mg  1,000 mg Oral QPM Tasrif Ahmed, MD      . ergocalciferol (VITAMIN D2) capsule 50,000 Units  50,000 Units Oral Q Dolores Lory Ahmed, MD   50,000 Units at 10/22/14 1635  . fluticasone (FLONASE) 50 MCG/ACT nasal spray 2 spray  2 spray Each Nare Daily Tasrif Ahmed, MD   2 spray at 10/22/14 1636  . gabapentin (NEURONTIN) capsule 300 mg  300 mg Oral QHS Dellia Nims, MD      . [  START ON 10/23/2014] insulin detemir (LEVEMIR) injection 25 Units  25 Units Subcutaneous Q breakfast Tasrif Ahmed, MD      . pantoprazole (PROTONIX) EC tablet 40 mg  40 mg Oral Daily Tasrif Ahmed, MD      . Derrill Memo ON 10/23/2014] potassium chloride (K-DUR) CR tablet 20 mEq  20 mEq Oral QAC breakfast Tasrif Ahmed, MD      . predniSONE (DELTASONE) tablet 20 mg  20 mg Oral Q breakfast Tasrif Ahmed, MD   20 mg at 10/22/14 1635  . rOPINIRole (REQUIP) tablet 3 mg  3 mg Oral BID Tasrif Ahmed, MD      . Tiotropium Bromide-Olodaterol 2.5-2.5 MCG/ACT AERS 2 puff  2 puff Inhalation Daily Tasrif Ahmed, MD   2 puff at 10/22/14 1500  . traMADol (ULTRAM) tablet 50 mg  50 mg Oral Q12H PRN Dellia Nims, MD       Current Outpatient Prescriptions  Medication Sig Dispense Refill  . albuterol (PROVENTIL) (2.5  MG/3ML) 0.083% nebulizer solution Take 3 mLs (2.5 mg total) by nebulization 3 (three) times daily. 270 mL 11  . aspirin 81 MG chewable tablet Chew 1 tablet (81 mg total) by mouth daily. 30 tablet 0  . citalopram (CELEXA) 40 MG tablet Take 40 mg by mouth daily.    . Colesevelam HCl 3.75 G PACK Take 1 packet by mouth every evening.    . divalproex (DEPAKOTE) 500 MG EC tablet Take 1,000 mg by mouth every evening.     . ergocalciferol (VITAMIN D2) 50000 UNITS capsule Take 50,000 Units by mouth every Friday.    . fluticasone (FLONASE) 50 MCG/ACT nasal spray Place 2 sprays into both nostrils daily. (Patient taking differently: Place 2 sprays into both nostrils 2 (two) times daily. ) 48 g 1  . gabapentin (NEURONTIN) 300 MG capsule Take 300 mg by mouth at bedtime.    Marland Kitchen HUMALOG KWIKPEN 100 UNIT/ML KiwkPen Inject 11 Units into the skin 3 (three) times daily.     Marland Kitchen ibuprofen (ADVIL,MOTRIN) 200 MG tablet Take 400 mg by mouth every 6 (six) hours as needed for moderate pain.    Marland Kitchen LEVEMIR FLEXTOUCH 100 UNIT/ML Pen Inject 40 Units into the skin daily with breakfast.     . loratadine (CLARITIN) 10 MG tablet Take 1 tablet (10 mg total) by mouth daily. 30 tablet 11  . pantoprazole (PROTONIX) 40 MG tablet Take 40 mg by mouth daily.     . potassium chloride (KLOR-CON) 10 MEQ CR tablet Take 20 mEq by mouth daily before breakfast.     . rOPINIRole (REQUIP) 3 MG tablet Take 3 mg by mouth 2 (two) times daily.      Marland Kitchen spironolactone (ALDACTONE) 25 MG tablet Take 25 mg by mouth 2 (two) times daily.     Marland Kitchen tetrahydrozoline-zinc (VISINE-AC) 0.05-0.25 % ophthalmic solution Place 1 drop into both eyes daily as needed (dry eyes).    . Tiotropium Bromide-Olodaterol (STIOLTO RESPIMAT) 2.5-2.5 MCG/ACT AERS Inhale 2 puffs into the lungs daily. 12 g 1  . torsemide (DEMADEX) 100 MG tablet Take 50 mg by mouth 2 (two) times daily.    . traMADol (ULTRAM) 50 MG tablet Takes 1 tablet in the morning and 2 tablets at night    . PROAIR HFA 108  (90 BASE) MCG/ACT inhaler Use 2 puffs every 6 hours  as needed for wheezing or  shortness of breath 3 Inhaler 1    Allergies: Allergies as of 10/22/2014 - Review Complete 10/22/2014  Allergen Reaction Noted  .  Clindamycin/lincomycin Swelling and Rash 05/23/2011  . Penicillins Swelling and Rash   . Tape Itching and Rash 10/27/2012   Past Medical History  Diagnosis Date  . HTN (hypertension)   . Diabetes mellitus   . Fibrosis of lung   . CHF (congestive heart failure)   . GERD (gastroesophageal reflux disease)   . Chronic headache     migraines  . UTI (urinary tract infection)     " MULTIPLE TIMES THIS PAST YEAR "  . Arthritis   . Anemia   . Cataract   . COPD (chronic obstructive pulmonary disease)   . Depression   . Osteoporosis   . Hyperlipidemia    Past Surgical History  Procedure Laterality Date  . Neck surgery  1987  . Partial hysterectomy    . Tubal ligation    . Cataract extraction    . Abdominal hysterectomy     Family History  Problem Relation Age of Onset  . Prostate cancer Brother   . Heart disease Brother     MI at age 74  . Stroke Mother     stroke at age 59  . Diabetes Mother   . Asthma Son   . Emphysema Father   . Heart disease Father   . Lung cancer Sister   . Heart disease Sister     MI at 58  . Heart disease Brother     all three brothers have had MI   Social History   Social History  . Marital Status: Widowed    Spouse Name: N/A  . Number of Children: 4  . Years of Education: N/A   Occupational History  . disabled    Social History Main Topics  . Smoking status: Former Smoker -- 1.00 packs/day for 8 years    Types: Cigarettes    Quit date: 02/20/1968  . Smokeless tobacco: Never Used  . Alcohol Use: No  . Drug Use: No  . Sexual Activity: Not on file   Other Topics Concern  . Not on file   Social History Narrative    Review of Systems: Review of Systems  Constitutional: Positive for fever, chills and malaise/fatigue.    Eyes: Positive for blurred vision.  Respiratory: Positive for cough. Negative for sputum production, shortness of breath and wheezing.   Cardiovascular: Negative for chest pain and palpitations.  Gastrointestinal: Positive for heartburn. Negative for nausea, vomiting, abdominal pain, diarrhea and constipation.  Genitourinary: Negative for dysuria.  Skin: Positive for rash. Negative for itching.  Neurological: Positive for speech change, weakness and headaches. Negative for dizziness, sensory change and focal weakness.   Physical Exam: Blood pressure 150/69, pulse 85, temperature 98.1 F (36.7 C), temperature source Oral, resp. rate 20, SpO2 100 %. GENERAL- alert, co-operative, appears as stated age, not in any distress. HEENT- Atraumatic, normocephalic, PERRL, EOMI, oral mucosa appears moist CARDIAC- RRR, no murmurs, rubs or gallops. RESP- Moving equal volumes of air, and clear to auscultation bilaterally, no wheezes or crackles. ABDOMEN- Soft, nontender, no guarding or rebound, no palpable masses or organomegaly, bowel sounds present. BACK- Normal curvature of the spine, No tenderness along the vertebrae, no CVA tenderness. NEURO- No obvious Cr N abnormality, strength upper and lower extremities- 5/5, Sensation intact- globally, some difficulty with word finding but no slurred speech EXTREMITIES- pulse 2+, symmetric, 2+ pitting edema to level of knees on left, 1+ on right. Erythema and warmth on anterior surfaces of bilateral lower legs, worse on left. Pain to palpation on left.  PSYCH- Normal mood and affect, appropriate thought content and speech.  Lab results: Basic Metabolic Panel:  Recent Labs  10/22/14 1200  NA 131*  K 3.4*  CL 90*  CO2 32  GLUCOSE 208*  BUN 10  CREATININE 1.09*  CALCIUM 8.8*   Liver Function Tests:  Recent Labs  10/22/14 1200  AST 38  ALT 29  ALKPHOS 62  BILITOT 0.4  PROT 6.7  ALBUMIN 3.2*   CBC:  Recent Labs  10/22/14 1200  WBC 5.1   HGB 10.6*  HCT 32.2*  MCV 96.4  PLT 185   Urinalysis:  Recent Labs  10/22/14 1422  COLORURINE YELLOW  LABSPEC 1.010  PHURINE 6.5  GLUCOSEU 250*  HGBUR NEGATIVE  BILIRUBINUR NEGATIVE  KETONESUR NEGATIVE  PROTEINUR NEGATIVE  UROBILINOGEN 1.0  NITRITE NEGATIVE  LEUKOCYTESUR TRACE*   Imaging results:  Dg Chest 2 View  10/22/2014   CLINICAL DATA:  Cough  EXAM: CHEST  2 VIEW  COMPARISON:  CT 09/30/2014, chest radiograph 09/21/2014  FINDINGS: The patient is rotated to the left. Mild enlargement of the cardiac silhouette is noted. No pleural effusion. Diffusely prominent interstitial reticular opacities are noted. No focal pulmonary opacity. No pleural effusion. Bones are subjectively osteopenic. No acute osseous abnormality.  IMPRESSION: Diffusely prominent nonspecific interstitial lung markings without focal acute finding.   Electronically Signed   By: Conchita Paris M.D.   On: 10/22/2014 12:38   Ct Head Wo Contrast  10/22/2014   CLINICAL DATA:  Difficulty with balance  EXAM: CT HEAD WITHOUT CONTRAST  TECHNIQUE: Contiguous axial images were obtained from the base of the skull through the vertex without intravenous contrast.  COMPARISON:  April 17, 2012  FINDINGS: Mild diffuse atrophy is stable. There is no intracranial mass, hemorrhage, extra-axial fluid collection, or midline shift. There is mild patchy small vessel disease in the centra semiovale bilaterally. There are small prior lacunar type infarcts in the posterior mid left cerebellum. There is a prior small infarct in the inferior posterior right cerebellum, stable. No acute infarct evident. The bony calvarium appears intact. The mastoid air cells are clear. There is rightward deviation of the nasal septum.  IMPRESSION: Mild atrophy with patchy small vessel disease in the supratentorial infratentorial regions. 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 No acute infarct evident. No hemorrhage or  mass effect. Rightward deviation of nasal septum.   Electronically Signed   By: Lowella Grip III M.D.   On: 10/22/2014 15:22   Other results: EKG: Sinus tachycardia, Borderline repolarization abnormality, No significant change since last tracing   Assessment & Plan by Problem: Principal Problem:   Cellulitis Active Problems:   Diabetes mellitus type 2, controlled   Essential hypertension   PULMONARY SARCOIDOSIS   Anemia  Cellulitis: Patient with 1 week history of erythema on bilateral lower extremities. Patient with warmth and edema on exam. Concerning for infection. Afebrile with WBC of 5.1. Not tachycardic, or tachypneic and normotensive. Lactic acid slighly elevated at 2.48. No concerns for sepsis. No abscess or drainage. Blood cultures were drawn. She received vanc, flagyl and aztreonam in ED. Patient with non-anaphylactic allergy to Penicillin and Clindamycin. Patient with LE Dopplers done at PCP yesterday negative for DVT.  - Start Ancef IV 1g q6hr - CBC in AM - Trend lactic acid - Blood cultures pending - NS 125 mL/hr   Sarcoidosis with chronic respiratory failure: Patient is followed by Dr. Lamonte Sakai. On 3L [place] at baseline. She reports being on  20 mg of Prednisone daily for > 1 month. Ran out of Prednisone 2-3 days ago. Abrupt discontinuation of chronic steroid could be contributing to her symptoms. - Prednisone 20 mg daily - Albuterol  - Tiotropium  - O2 sat >92%  DMII: Last A1c 7.8 in 06/2014. Home regimen is Levemir 40 units qhs and Humalog 11 units tid qac.  - Levemir 25 units qhs - SSI  Restless Leg Syndrome: - Ropinerole 3mg  twice daily - gabapentin 300mg  qHS   Depression: - Celexa 40mg  qhs   Anemia: Chronically anemic with a baseline ~10. Fe studies on 5/15 with normal iron and ferritin but increased TIBC and decreased % saturation.  Migraines:  - Depakote 1000 mg daily  FEN/GI: Heart health/carb modified  GERD:  Protonix 40 mg daily  DVT PPx:  Lovenox  Dispo: Disposition is deferred at this time, awaiting improvement of current medical problems. Anticipated discharge in approximately 1-3 day(s).   The patient does have a current PCP Ronita Hipps, MD) and does need an [facility] hospital follow-up appointment after discharge.  The patient does not have transportation limitations that hinder transportation to clinic appointments.  Signed: Maryellen Pile, MD 10/22/2014, 6:06 PM

## 2014-10-22 NOTE — ED Notes (Signed)
Phlebotomy at bedside.

## 2014-10-22 NOTE — ED Notes (Signed)
Patient transported to CT 

## 2014-10-22 NOTE — Progress Notes (Signed)
Penny Curry is a 74 y.o. female patient admitted from ED awake, alert - oriented  X 4 - no acute distress noted.  VSS - Blood pressure 135/59, pulse 93, temperature 98.2 F (36.8 C), temperature source Oral, resp. rate 18, height 5\' 5"  (1.651 m), weight 94.802 kg (209 lb), SpO2 96 %.    IV in place, occlusive dsg intact without redness.  Orientation to room, and floor completed with information packet given to patient/family.  Admission INP armband ID verified with patient/family, and in place.   SR up x 2, fall assessment complete, with patient and family able to verbalize understanding of risk associated with falls, and verbalized understanding to call nsg before up out of bed.  Call light within reach, patient able to voice, and demonstrate understanding.  Skin, clean-dry- intact without evidence of bruising, or skin tears.   No evidence of skin break down noted on exam.     Will cont to eval and treat per MD orders.  Horton Finer, RN 10/22/2014 7:35 PM

## 2014-10-22 NOTE — ED Notes (Signed)
Patient transported to X-ray 

## 2014-10-22 NOTE — ED Notes (Signed)
Attempted report 

## 2014-10-23 DIAGNOSIS — L03116 Cellulitis of left lower limb: Secondary | ICD-10-CM | POA: Diagnosis not present

## 2014-10-23 DIAGNOSIS — D869 Sarcoidosis, unspecified: Secondary | ICD-10-CM

## 2014-10-23 DIAGNOSIS — G9340 Encephalopathy, unspecified: Secondary | ICD-10-CM | POA: Diagnosis not present

## 2014-10-23 DIAGNOSIS — L03115 Cellulitis of right lower limb: Principal | ICD-10-CM

## 2014-10-23 DIAGNOSIS — Z87891 Personal history of nicotine dependence: Secondary | ICD-10-CM

## 2014-10-23 DIAGNOSIS — D649 Anemia, unspecified: Secondary | ICD-10-CM

## 2014-10-23 DIAGNOSIS — E669 Obesity, unspecified: Secondary | ICD-10-CM

## 2014-10-23 DIAGNOSIS — J449 Chronic obstructive pulmonary disease, unspecified: Secondary | ICD-10-CM | POA: Diagnosis not present

## 2014-10-23 DIAGNOSIS — G2581 Restless legs syndrome: Secondary | ICD-10-CM

## 2014-10-23 DIAGNOSIS — J9610 Chronic respiratory failure, unspecified whether with hypoxia or hypercapnia: Secondary | ICD-10-CM | POA: Diagnosis not present

## 2014-10-23 DIAGNOSIS — F329 Major depressive disorder, single episode, unspecified: Secondary | ICD-10-CM

## 2014-10-23 DIAGNOSIS — E876 Hypokalemia: Secondary | ICD-10-CM | POA: Diagnosis not present

## 2014-10-23 DIAGNOSIS — B9689 Other specified bacterial agents as the cause of diseases classified elsewhere: Secondary | ICD-10-CM

## 2014-10-23 DIAGNOSIS — Z794 Long term (current) use of insulin: Secondary | ICD-10-CM | POA: Diagnosis not present

## 2014-10-23 DIAGNOSIS — K219 Gastro-esophageal reflux disease without esophagitis: Secondary | ICD-10-CM | POA: Diagnosis not present

## 2014-10-23 DIAGNOSIS — D860 Sarcoidosis of lung: Secondary | ICD-10-CM | POA: Diagnosis not present

## 2014-10-23 DIAGNOSIS — Z7982 Long term (current) use of aspirin: Secondary | ICD-10-CM | POA: Diagnosis not present

## 2014-10-23 DIAGNOSIS — M7989 Other specified soft tissue disorders: Secondary | ICD-10-CM | POA: Diagnosis not present

## 2014-10-23 DIAGNOSIS — Z7952 Long term (current) use of systemic steroids: Secondary | ICD-10-CM

## 2014-10-23 DIAGNOSIS — G43909 Migraine, unspecified, not intractable, without status migrainosus: Secondary | ICD-10-CM

## 2014-10-23 DIAGNOSIS — E1165 Type 2 diabetes mellitus with hyperglycemia: Secondary | ICD-10-CM

## 2014-10-23 DIAGNOSIS — I509 Heart failure, unspecified: Secondary | ICD-10-CM | POA: Diagnosis not present

## 2014-10-23 DIAGNOSIS — Z880 Allergy status to penicillin: Secondary | ICD-10-CM | POA: Diagnosis not present

## 2014-10-23 DIAGNOSIS — J8410 Pulmonary fibrosis, unspecified: Secondary | ICD-10-CM | POA: Diagnosis not present

## 2014-10-23 DIAGNOSIS — E871 Hypo-osmolality and hyponatremia: Secondary | ICD-10-CM | POA: Diagnosis not present

## 2014-10-23 DIAGNOSIS — I10 Essential (primary) hypertension: Secondary | ICD-10-CM | POA: Diagnosis not present

## 2014-10-23 DIAGNOSIS — Z23 Encounter for immunization: Secondary | ICD-10-CM | POA: Diagnosis not present

## 2014-10-23 DIAGNOSIS — Z9981 Dependence on supplemental oxygen: Secondary | ICD-10-CM | POA: Diagnosis not present

## 2014-10-23 DIAGNOSIS — R6883 Chills (without fever): Secondary | ICD-10-CM | POA: Diagnosis not present

## 2014-10-23 DIAGNOSIS — Z6834 Body mass index (BMI) 34.0-34.9, adult: Secondary | ICD-10-CM | POA: Diagnosis not present

## 2014-10-23 LAB — CBC
HCT: 30.2 % — ABNORMAL LOW (ref 36.0–46.0)
HEMOGLOBIN: 9.9 g/dL — AB (ref 12.0–15.0)
MCH: 32 pg (ref 26.0–34.0)
MCHC: 32.8 g/dL (ref 30.0–36.0)
MCV: 97.7 fL (ref 78.0–100.0)
PLATELETS: 171 10*3/uL (ref 150–400)
RBC: 3.09 MIL/uL — AB (ref 3.87–5.11)
RDW: 14.5 % (ref 11.5–15.5)
WBC: 4.6 10*3/uL (ref 4.0–10.5)

## 2014-10-23 LAB — BASIC METABOLIC PANEL
ANION GAP: 7 (ref 5–15)
BUN: 12 mg/dL (ref 6–20)
CALCIUM: 8.7 mg/dL — AB (ref 8.9–10.3)
CO2: 30 mmol/L (ref 22–32)
CREATININE: 1 mg/dL (ref 0.44–1.00)
Chloride: 97 mmol/L — ABNORMAL LOW (ref 101–111)
GFR, EST NON AFRICAN AMERICAN: 55 mL/min — AB (ref >60)
Glucose, Bld: 363 mg/dL — ABNORMAL HIGH (ref 65–99)
Potassium: 4.8 mmol/L (ref 3.5–5.1)
SODIUM: 134 mmol/L — AB (ref 135–145)

## 2014-10-23 LAB — LACTIC ACID, PLASMA
LACTIC ACID, VENOUS: 2 mmol/L (ref 0.5–2.0)
LACTIC ACID, VENOUS: 2.6 mmol/L — AB (ref 0.5–2.0)

## 2014-10-23 LAB — GLUCOSE, CAPILLARY
GLUCOSE-CAPILLARY: 276 mg/dL — AB (ref 65–99)
Glucose-Capillary: 329 mg/dL — ABNORMAL HIGH (ref 65–99)
Glucose-Capillary: 373 mg/dL — ABNORMAL HIGH (ref 65–99)
Glucose-Capillary: 382 mg/dL — ABNORMAL HIGH (ref 65–99)
Glucose-Capillary: 374 mg/dL — ABNORMAL HIGH (ref 65–99)

## 2014-10-23 MED ORDER — CEFAZOLIN SODIUM 1-5 GM-% IV SOLN
1.0000 g | Freq: Three times a day (TID) | INTRAVENOUS | Status: AC
  Administered 2014-10-23 (×2): 1 g via INTRAVENOUS
  Filled 2014-10-23 (×2): qty 50

## 2014-10-23 MED ORDER — CEPHALEXIN 500 MG PO CAPS
500.0000 mg | ORAL_CAPSULE | Freq: Four times a day (QID) | ORAL | Status: DC
  Administered 2014-10-24 (×2): 500 mg via ORAL
  Filled 2014-10-23 (×2): qty 1

## 2014-10-23 MED ORDER — GLUCERNA SHAKE PO LIQD
237.0000 mL | Freq: Two times a day (BID) | ORAL | Status: DC
  Administered 2014-10-23 – 2014-10-24 (×2): 237 mL via ORAL

## 2014-10-23 NOTE — Progress Notes (Signed)
Subjective: Patient reports doing well this morning. No complaint today.   Objective: Vital signs in last 24 hours: Filed Vitals:   10/22/14 2118 10/22/14 2120 10/22/14 2237 10/23/14 0503  BP: 111/47 119/62  110/62  Pulse: 89 90 72 88  Temp: 98.3 F (36.8 C)   97.6 F (36.4 C)  TempSrc: Oral   Oral  Resp: 18  16 16   Height:      Weight:    209 lb 10.5 oz (95.1 kg)  SpO2: 99%  98% 90%   Weight change:   Intake/Output Summary (Last 24 hours) at 10/23/14 0852 Last data filed at 10/23/14 0806  Gross per 24 hour  Intake      0 ml  Output    900 ml  Net   -900 ml   Physical Exam: GENERAL- alert, co-operative, appears as stated age, not in any distress. HEENT- Atraumatic, normocephalic, PERRL, EOMI, oral mucosa appears moist CARDIAC- RRR, no murmurs, rubs or gallops. RESP- Moving equal volumes of air, and clear to auscultation bilaterally, no wheezes or crackles. ABDOMEN- Soft, nontender, no guarding or rebound, no palpable masses or organomegaly, bowel sounds present. NEURO- No obvious Cr N abnormality, strength upper and lower extremities- 5/5, Sensation intact- globally, so difficulty with speech. AAOX3. EXTREMITIES- pulse 2+, symmetric, 2+ pitting edema to level of knees on left, 1+ on right. Erythema improved on bilateral lower legs with only faint anterior erythema on right. Warmth still present bilaterally. Non tender to palpation.  PSYCH- Normal mood and affect, appropriate thought content and speech.  Lab Results: Basic Metabolic Panel:  Recent Labs Lab 10/22/14 1200 10/23/14 0316  NA 131* 134*  K 3.4* 4.8  CL 90* 97*  CO2 32 30  GLUCOSE 208* 363*  BUN 10 12  CREATININE 1.09* 1.00  CALCIUM 8.8* 8.7*   Liver Function Tests:  Recent Labs Lab 10/22/14 1200  AST 38  ALT 29  ALKPHOS 62  BILITOT 0.4  PROT 6.7  ALBUMIN 3.2*   CBC:  Recent Labs Lab 10/22/14 1200 10/23/14 0316  WBC 5.1 4.6  HGB 10.6* 9.9*  HCT 32.2* 30.2*  MCV 96.4 97.7  PLT 185  171   CBG:  Recent Labs Lab 10/22/14 1855 10/22/14 2200 10/23/14 0748  GLUCAP 232* 412* 276*   Urinalysis:  Recent Labs Lab 10/22/14 1422  COLORURINE YELLOW  LABSPEC 1.010  PHURINE 6.5  GLUCOSEU 250*  HGBUR NEGATIVE  BILIRUBINUR NEGATIVE  KETONESUR NEGATIVE  PROTEINUR NEGATIVE  UROBILINOGEN 1.0  NITRITE NEGATIVE  LEUKOCYTESUR TRACE*   Micro Results: Recent Results (from the past 240 hour(s))  Blood Culture (routine x 2)     Status: None (Preliminary result)   Collection Time: 10/22/14  1:45 PM  Result Value Ref Range Status   Specimen Description BLOOD LEFT ANTECUBITAL  Final   Special Requests BOTTLES DRAWN AEROBIC AND ANAEROBIC 10cc  Final   Culture PENDING  Incomplete   Report Status PENDING  Incomplete  Blood Culture (routine x 2)     Status: None (Preliminary result)   Collection Time: 10/22/14  1:55 PM  Result Value Ref Range Status   Specimen Description BLOOD LEFT HAND  Final   Special Requests BOTTLES DRAWN AEROBIC AND ANAEROBIC 5ML  Final   Culture PENDING  Incomplete   Report Status PENDING  Incomplete   Studies/Results: Dg Chest 2 View  10/22/2014   CLINICAL DATA:  Cough  EXAM: CHEST  2 VIEW  COMPARISON:  CT 09/30/2014, chest radiograph 09/21/2014  FINDINGS:  The patient is rotated to the left. Mild enlargement of the cardiac silhouette is noted. No pleural effusion. Diffusely prominent interstitial reticular opacities are noted. No focal pulmonary opacity. No pleural effusion. Bones are subjectively osteopenic. No acute osseous abnormality.  IMPRESSION: Diffusely prominent nonspecific interstitial lung markings without focal acute finding.   Electronically Signed   By: Conchita Paris M.D.   On: 10/22/2014 12:38   Ct Head Wo Contrast  10/22/2014   CLINICAL DATA:  Difficulty with balance  EXAM: CT HEAD WITHOUT CONTRAST  TECHNIQUE: Contiguous axial images were obtained from the base of the skull through the vertex without intravenous contrast.  COMPARISON:   April 17, 2012  FINDINGS: Mild diffuse atrophy is stable. There is no intracranial mass, hemorrhage, extra-axial fluid collection, or midline shift. There is mild patchy small vessel disease in the centra semiovale bilaterally. There are small prior lacunar type infarcts in the posterior mid left cerebellum. There is a prior small infarct in the inferior posterior right cerebellum, stable. No acute infarct evident. The bony calvarium appears intact. The mastoid air cells are clear. There is rightward deviation of the nasal septum.  IMPRESSION: Mild atrophy with patchy small vessel disease in the supratentorial infratentorial regions. 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 No acute infarct evident. No hemorrhage or mass effect. Rightward deviation of nasal septum.   Electronically Signed   By: Lowella Grip III M.D.   On: 10/22/2014 15:22   Medications: I have reviewed the patient's current medications. Scheduled Meds: . albuterol  2.5 mg Nebulization TID  . aspirin  81 mg Oral Daily  .  ceFAZolin (ANCEF) IV  1 g Intravenous 3 times per day  . citalopram  40 mg Oral Daily  . divalproex  1,000 mg Oral QPM  . enoxaparin (LOVENOX) injection  40 mg Subcutaneous Q24H  . ergocalciferol  50,000 Units Oral Q Fri  . fluticasone  2 spray Each Nare Daily  . gabapentin  300 mg Oral QHS  . Influenza vac split quadrivalent PF  0.5 mL Intramuscular Tomorrow-1000  . insulin aspart  0-5 Units Subcutaneous QHS  . insulin aspart  0-9 Units Subcutaneous TID WC  . insulin detemir  25 Units Subcutaneous Q breakfast  . pantoprazole  40 mg Oral Daily  . potassium chloride  20 mEq Oral QAC breakfast  . predniSONE  20 mg Oral Q breakfast  . rOPINIRole  3 mg Oral BID  . Tiotropium Bromide-Olodaterol  2 puff Inhalation Daily   Continuous Infusions: . sodium chloride    . sodium chloride 125 mL/hr at 10/23/14 0748   PRN Meds:.traMADol Assessment/Plan: Principal  Problem:   Cellulitis Active Problems:   Diabetes mellitus type 2, controlled   Essential hypertension   PULMONARY SARCOIDOSIS   Anemia  Cellulitis: Remains afebrile with no white count. Mental status AAOx3. Cellulitis improving bilaterally. Continue current therapy and transition to oral ABX tomorrow. Lactic acidosis resolved with fluids.  - Continue Ancef IV 1g q6hr for today - Switch to Keflex 500 mg PO q6hr tomorrow. Complete 5 day total course.  - Blood cultures NG <24 hours - Stop fluids. Patient eating and drinking well.   Sarcoidosis with chronic respiratory failure: No complaints of SOB. Patient is followed by Dr. Lamonte Sakai. On 3L [place] at baseline. She reports being on 20 mg of Prednisone daily for > 1 month. Ran out of Prednisone 2-3 days ago. Abrupt discontinuation of chronic steroid could be contributing to her symptoms. -  Continue Prednisone 20 mg daily - Albuterol  - Tiotropium  - O2 sat >92%  DMII: Last A1c 7.8 in 06/2014. Home regimen is Levemir 40 units qhs and Humalog 11 units tid qac.  - Levemir 25 units qhs - SSI  Restless Leg Syndrome: - Ropinerole 3mg  twice daily - gabapentin 300mg  qHS   Depression: - Celexa 40mg  qhs   Anemia: Chronically anemic with a baseline ~10. Fe studies on 5/15 with normal iron and ferritin but increased TIBC and decreased % saturation.  Migraines:  - Depakote 1000 mg daily  FEN/GI: Heart health/carb modified  GERD: Protonix 40 mg daily  DVT PPx: Lovenox  Dispo: Disposition is deferred at this time, awaiting improvement of current medical problems.  Anticipated discharge in approximately 1 day(s).   The patient does have a current PCP Ronita Hipps, MD) and does need an [facility] hospital follow-up appointment after discharge.  The patient does not have transportation limitations that hinder transportation to clinic appointments.  .Services Needed at time of discharge: Y = Yes, Blank = No PT:   OT:   RN:   Equipment:   Other:      LOS: 1 day   Maryellen Pile, MD 10/23/2014, 8:52 AM

## 2014-10-23 NOTE — Progress Notes (Signed)
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 She reports eating half of her normal amount during this time. She follows a DM diet at home to the best of her ability. She takes a vitamin D supplement  She says her normal weight is 200, but it varies greatly due to her fluid shifts.   Right now she reports that her appetite is coming back and she is feeling better. She would like glucerna BID.   Diet Order:  Diet heart healthy/carb modified Room service appropriate?: Yes; Fluid consistency:: Thin  Skin:  Reviewed, no issues  Last BM:  8/31  Height:  Ht Readings from Last 1 Encounters:  10/22/14 5\' 5"  (1.651 m)   Weight:  Wt Readings from Last 1 Encounters:  10/23/14 209 lb 10.5 oz (95.1 kg)   Wt Readings from Last 10 Encounters:  10/23/14 209 lb 10.5 oz (95.1 kg)  09/16/14 198 lb (89.812 kg)  09/08/14 207 lb 4.8 oz (94.031 kg)  08/06/14 201 lb (91.173 kg)  07/05/14 205 lb (92.987 kg)  07/04/14 207 lb 2 oz (93.951 kg)  02/04/14 211 lb (95.709 kg)  01/07/14 205 lb (92.987 kg)  11/26/13 208 lb 9.6 oz (94.62 kg)  08/05/13 207 lb (93.895 kg)   Ideal Body Weight:   56.82 kg  BMI:  Body mass index is 34.89 kg/(m^2).  Estimated Nutritional Needs:  Kcal:  1400-1600 kcals (14-16 kcal/kg) Protein:  63-74 (1-1.2 g/kg IBW) Fluid:  1.4-1.6 liters  EDUCATION NEEDS:  No education needs identified at this time  Burtis Junes RD, LDN Nutrition Pager: [phone] 10/23/2014 11:19 AM

## 2014-10-23 NOTE — Evaluation (Signed)
Physical Therapy Evaluation Patient Details Name: Penny Curry MRN: [id number] DOB: May 14, 1940 Today's Date: 10/23/2014   History of Present Illness  patient admitted with cellulitis, sarcoidosis with chronic respiratory failure on home O2 and DMII  Clinical Impression  Patient started out slightly unsteady and improved with increased gait training.  Patient will benefit from continued PT to progress mobility and independence.  Recommend patient use RW at home initially and F/U PT to continue to progress back to cane.     Follow Up Recommendations Home health PT;Supervision - Intermittent    Equipment Recommendations  None recommended by PT    Recommendations for Other Services       Precautions / Restrictions Precautions Precautions: Fall Restrictions Weight Bearing Restrictions: No      Mobility  Bed Mobility Overal bed mobility: Modified Independent             General bed mobility comments: used railing sit to and from supine  Transfers Overall transfer level: Needs assistance Equipment used: Rolling walker (2 wheeled) Transfers: Sit to/from Stand Sit to Stand: Min guard         General transfer comment: slightly unsteady, required verbal cues for hand placement  Ambulation/Gait Ambulation/Gait assistance: Min assist Ambulation Distance (Feet): 100 Feet Assistive device: Rolling walker (2 wheeled) Gait Pattern/deviations: Step-through pattern Gait velocity: decreased   General Gait Details: started out slightly unsteady requiring min assist, after ~40 feet, became more steady and more min-guard for gait.  Stairs            Wheelchair Mobility    Modified Rankin (Stroke Patients Only) Modified Rankin (Stroke Patients Only) Pre-Morbid Rankin Score: Moderate disability Modified Rankin: Moderately severe disability     Balance Overall balance assessment: Needs assistance Sitting-balance support: No upper extremity supported Sitting  balance-Leahy Scale: Good     Standing balance support: Bilateral upper extremity supported Standing balance-Leahy Scale: Poor Standing balance comment: required RW for balance                             Pertinent Vitals/Pain Pain Assessment: 0-10 Pain Score: 5  Pain Location: BLE's Pain Descriptors / Indicators: Aching;Sore Pain Intervention(s): Limited activity within patient's tolerance;Monitored during session    [place] expects to be discharged to:: Private residence Living Arrangements: Other (Comment) (grandaughter lives with her) Available Help at Discharge: Family;Available PRN/intermittently Type of Home: Mobile home Home Access: Stairs to enter Entrance Stairs-Rails: Left;Right;Can reach both Entrance Stairs-Number of Steps: 4 Home Layout: One level Home Equipment: Cane - single point;Walker - 2 wheels      Prior Function Level of Independence: Independent with assistive device(s) (cane)         Comments: just recently finished HHPT     Hand Dominance        Extremity/Trunk Assessment   Upper Extremity Assessment: Overall WFL for tasks assessed           Lower Extremity Assessment: Overall WFL for tasks assessed      Cervical / Trunk Assessment: Normal  Communication   Communication: No difficulties  Cognition Arousal/Alertness: Awake/alert Behavior During Therapy: WFL for tasks assessed/performed Overall Cognitive Status: Within Functional Limits for tasks assessed                      General Comments      Exercises        Assessment/Plan    PT Assessment Patient needs  continued PT services  PT Diagnosis Generalized weakness   PT Problem List Decreased activity tolerance;Decreased balance;Decreased mobility  PT Treatment Interventions Gait training;Functional mobility training;Therapeutic activities;Therapeutic exercise;Balance training;Patient/family education;Stair training   PT Goals  (Current goals can be found in the Care Plan section) Acute Rehab PT Goals Patient Stated Goal: go home when able PT Goal Formulation: With patient Time For Goal Achievement: 10/30/14 Potential to Achieve Goals: Good    Frequency Min 3X/week   Barriers to discharge Decreased caregiver support will be alone during day    Co-evaluation               End of Session Equipment Utilized During Treatment: Gait belt;Oxygen Activity Tolerance: Patient tolerated treatment well Patient left: in bed;with bed alarm set;with call bell/phone within reach           Time: 1540-1602 PT Time Calculation (min) (ACUTE ONLY): 22 min   Charges:   PT Evaluation $Initial PT Evaluation Tier I: 1 Procedure     PT G CodesShanna Cisco 10/23/2014, 4:13 PM  10/23/2014 [place], [place]

## 2014-10-23 NOTE — H&P (Signed)
  Date: 10/23/2014  Patient name: Penny Curry  Medical record number: [id number]  Date of birth: 1940-07-20   I have seen and evaluated Penny Curry and discussed their care with the Residency Team.  Briefly, Penny Curry is a 74yo woman with PMH of HTN, IDDM, Sarcoidosis and chronic respiratory failure who presents for generalized weakness, chills, loss of balance and rash on legs X 1 week.  She has had subjective fever and chills with generalized weakness and fall over th elast week.  Further issues include some slurred speech which is improved and a feeling of word finding difficulty per the patient.  She has had a red rash on her legs for the last week as well, worse on the left.  Her PCP saw her and did dopplers, which she reports as normal.  Further history included some confusion in th elast week and running out of her chronic prednisone in the last week as well.   On exam: She was alert, oriented to person/place/time.  She reported less word finding difficulty.  RR, NR, no murmur noted.  Lungs were clear with some mild crackles at bases.  Abdomen was soft and NT.  Extremities showed erythema to mid shins, worse on the left, also with some warmth, worse on the left, though this was improved from admission.  In the ED, she had a CXR which showed no acute findings (chronic changes from sarcoidosis).  CT head was done and showed no acute infarct, chronic changes only.  She was initiated on therapy for diabetic wound, though she has nonpurulent cellulitis.  Labs showed a mild hyponatremia and hypokalemia, hyperglycemia and a Cr of 1.09  Assessment and Plan: I have seen and evaluated the patient as outlined above. I agree with the formulated Assessment and Plan as detailed in the residents' admission note, with the following changes:   1. Non purulent cellulitis - Agree with Ancef as likely GP organism - Trend CBC - Blood cultures pending - NS at 125cc/hr - Trend lactic acid - Monitor encephalopathy  for improvement - If fever, broaden coverage  2. Acute encephalopathy - Likely related to acute infection, however, if she develops any focal findings, have low threshold to check an MRI brain - Also could be related to missing chronic steroids, causing an acute delerium, though blood pressure is find, no concerned for acute adrenal insufficiency  3. Sarcoidosis - Agree with restarting home prednisone dose.  If infection worsens and she becomes hypotensive, she may require stress dose steroids  4. DM2 - levemir and SSI started  Curry issues per resident note, check Depakote level  Penny Falcon, MD 9/3/201612:13 PM

## 2014-10-24 DIAGNOSIS — L03116 Cellulitis of left lower limb: Secondary | ICD-10-CM | POA: Insufficient documentation

## 2014-10-24 LAB — GLUCOSE, CAPILLARY
GLUCOSE-CAPILLARY: 305 mg/dL — AB (ref 65–99)
Glucose-Capillary: 211 mg/dL — ABNORMAL HIGH (ref 65–99)

## 2014-10-24 LAB — BASIC METABOLIC PANEL
Anion gap: 9 (ref 5–15)
BUN: 12 mg/dL (ref 6–20)
CHLORIDE: 100 mmol/L — AB (ref 101–111)
CO2: 30 mmol/L (ref 22–32)
CREATININE: 0.82 mg/dL (ref 0.44–1.00)
Calcium: 9.5 mg/dL (ref 8.9–10.3)
GFR calc Af Amer: >60 mL/min (ref >60)
GFR calc non Af Amer: >60 mL/min (ref >60)
GLUCOSE: 215 mg/dL — AB (ref 65–99)
Potassium: 4 mmol/L (ref 3.5–5.1)
Sodium: 139 mmol/L (ref 135–145)

## 2014-10-24 MED ORDER — PREDNISONE 20 MG PO TABS: 20.0000 mg | ORAL_TABLET | Freq: Every day | ORAL | Status: DC

## 2014-10-24 MED ORDER — CEPHALEXIN 500 MG PO CAPS: 500.0000 mg | ORAL_CAPSULE | Freq: Four times a day (QID) | ORAL | Status: DC

## 2014-10-24 NOTE — Discharge Instructions (Signed)
-  Start taking the antibiotic keflex every 6 hrs for the next 2 days until you finish all the pills, your next dose is at noon today. I sent this to your pharmacy, please go pick it up after you leave the hospital  -Start taking prednisone 20 mg daily, I sent this to your pharmacy, please go pick it up after you leave the hospital -Please follow-up with your primary care physician in 1 week  -Pleasure meeting you!!

## 2014-10-24 NOTE — Progress Notes (Signed)
Subjective:  Pt seen and examined in AM. No acute events overnight. Pt reports her leg is much better with mild pain and erythema. She has mild generalized weakness and no longer confused.    Objective: Vital signs in last 24 hours: Filed Vitals:   10/23/14 2107 10/23/14 2204 10/24/14 0710 10/24/14 0809  BP:  124/51    Pulse:  91    Temp:  98.2 F (36.8 C)    TempSrc:  Oral    Resp:  16    Height:      Weight:   204 lb (92.534 kg)   SpO2: 98% 99%  99%   Weight change:   Intake/Output Summary (Last 24 hours) at 10/24/14 0933 Last data filed at 10/24/14 0915  Gross per 24 hour  Intake    600 ml  Output    202 ml  Net    398 ml   PHYSICAL EXAMINATION: General: NAD Heart: Normal rate and rhythm  Lungs: Clear to auscultation bilaterally with no ronchi, rales, or wheezing  Abdomen: Soft, non-tender, non-tender, normal BS  Extremities: Mild erythema of left LE with mild edema and tenderness to palpation. See picture below Neuro: A & O x 3      Lab Results: Basic Metabolic Panel:  Recent Labs Lab 10/22/14 1200 10/23/14 0316  NA 131* 134*  K 3.4* 4.8  CL 90* 97*  CO2 32 30  GLUCOSE 208* 363*  BUN 10 12  CREATININE 1.09* 1.00  CALCIUM 8.8* 8.7*   Liver Function Tests:  Recent Labs Lab 10/22/14 1200  AST 38  ALT 29  ALKPHOS 62  BILITOT 0.4  PROT 6.7  ALBUMIN 3.2*   CBC:  Recent Labs Lab 10/22/14 1200 10/23/14 0316  WBC 5.1 4.6  HGB 10.6* 9.9*  HCT 32.2* 30.2*  MCV 96.4 97.7  PLT 185 171   CBG:  Recent Labs Lab 10/23/14 0748 10/23/14 1132 10/23/14 1655 10/23/14 1758 10/23/14 2159 10/24/14 0751  GLUCAP 276* 329* 373* 382* 374* 211*  Urinalysis:  Recent Labs Lab 10/22/14 1422  COLORURINE YELLOW  LABSPEC 1.010  PHURINE 6.5  GLUCOSEU 250*  HGBUR NEGATIVE  BILIRUBINUR NEGATIVE  KETONESUR NEGATIVE  PROTEINUR NEGATIVE  UROBILINOGEN 1.0  NITRITE NEGATIVE  LEUKOCYTESUR TRACE*    Micro Results: Recent Results (from the past  240 hour(s))  Blood Culture (routine x 2)     Status: None (Preliminary result)   Collection Time: 10/22/14  1:45 PM  Result Value Ref Range Status   Specimen Description BLOOD LEFT ANTECUBITAL  Final   Special Requests BOTTLES DRAWN AEROBIC AND ANAEROBIC 10cc  Final   Culture NO GROWTH < 24 HOURS  Final   Report Status PENDING  Incomplete  Blood Culture (routine x 2)     Status: None (Preliminary result)   Collection Time: 10/22/14  1:55 PM  Result Value Ref Range Status   Specimen Description BLOOD LEFT HAND  Final   Special Requests BOTTLES DRAWN AEROBIC AND ANAEROBIC 5ML  Final   Culture NO GROWTH < 24 HOURS  Final   Report Status PENDING  Incomplete   Studies/Results: Dg Chest 2 View  10/22/2014   CLINICAL DATA:  Cough  EXAM: CHEST  2 VIEW  COMPARISON:  CT 09/30/2014, chest radiograph 09/21/2014  FINDINGS: The patient is rotated to the left. Mild enlargement of the cardiac silhouette is noted. No pleural effusion. Diffusely prominent interstitial reticular opacities are noted. No focal pulmonary opacity. No pleural effusion. Bones are subjectively osteopenic. No  acute osseous abnormality.  IMPRESSION: Diffusely prominent nonspecific interstitial lung markings without focal acute finding.   Electronically Signed   By: Conchita Paris M.D.   On: 10/22/2014 12:38   Ct Head Wo Contrast  10/22/2014   CLINICAL DATA:  Difficulty with balance  EXAM: CT HEAD WITHOUT CONTRAST  TECHNIQUE: Contiguous axial images were obtained from the base of the skull through the vertex without intravenous contrast.  COMPARISON:  April 17, 2012  FINDINGS: Mild diffuse atrophy is stable. There is no intracranial mass, hemorrhage, extra-axial fluid collection, or midline shift. There is mild patchy small vessel disease in the centra semiovale bilaterally. There are small prior lacunar type infarcts in the posterior mid left cerebellum. There is a prior small infarct in the inferior posterior right cerebellum, stable.  No acute infarct evident. The bony calvarium appears intact. The mastoid air cells are clear. There is rightward deviation of the nasal septum.  IMPRESSION: Mild atrophy with patchy small vessel disease in the supratentorial infratentorial regions. 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 No acute infarct evident. No hemorrhage or mass effect. Rightward deviation of nasal septum.   Electronically Signed   By: Lowella Grip III M.D.   On: 10/22/2014 15:22   Medications: I have reviewed the patient's current medications. Scheduled Meds: . albuterol  2.5 mg Nebulization TID  . aspirin  81 mg Oral Daily  . cephALEXin  500 mg Oral 4 times per day  . citalopram  40 mg Oral Daily  . divalproex  1,000 mg Oral QPM  . enoxaparin (LOVENOX) injection  40 mg Subcutaneous Q24H  . ergocalciferol  50,000 Units Oral Q Fri  . feeding supplement (GLUCERNA SHAKE)  237 mL Oral BID BM  . fluticasone  2 spray Each Nare Daily  . gabapentin  300 mg Oral QHS  . insulin aspart  0-5 Units Subcutaneous QHS  . insulin aspart  0-9 Units Subcutaneous TID WC  . insulin detemir  25 Units Subcutaneous Q breakfast  . pantoprazole  40 mg Oral Daily  . potassium chloride  20 mEq Oral QAC breakfast  . predniSONE  20 mg Oral Q breakfast  . rOPINIRole  3 mg Oral BID  . Tiotropium Bromide-Olodaterol  2 puff Inhalation Daily   Continuous Infusions:  PRN Meds:.traMADol Assessment/Plan:  Left LE Nonpurulent Cellulitis - Improving with no systemic signs of infection. Unclear trigger.  -Continue keflex 500 mg Q 6 hr for 5 day course  -Follow-up blood cultures x 2 ---> NGTD  -Home heath PT ordered   -Pt to follow-up with PCP in 1 week  Sarcoidosis with chronic hypoxic respiratory failure- Possible recent flare due to running out of prednisone. Pt at home on 3L [place] oxygen.  -Continue prednisone 20 mg daily on discharge  -Continue home albuterol and spiriva on discharge    Hypertension -  Currently with normotensive  -Resume home spironolactone 25 mg BID and torsemide 50 mg BID on discharge   Insulin-dependent Type 2 DM - Last A1c 7.8 on 07/04/14.   -Resume Levemir 40 U daily and Humalog 11 U TID before meals on discharge  -Resume home gabapentin 300 mg daily at bedtime   Depression - Currently with stable mood. -Resume home celexa 40 mg daily on discharge, consider decreasing dose per pharmacy recommendations due to age  Chronic Migraine Headache - Currently with no headache.  -Resume depakote 1000 mg daily on discharge   Chronic Normocytic Anemia - Hg stable 9.9 on  9/3 near baseline 9-11 with no active bleeding. Last anemia panel on suggestive of mild iron-deficiency with ferritin 57 and high TIBC. -Followed by her PCP  GERD - Currently with no acid reflux  -Resume protonix 40 mg daily on discharge   Restless Leg Syndrome - Currently stable  -Resume ropinrole 3 mg BID on discharge  -Resume gabapentin 300 mg daily at bedtime on discharge   Dispo: Today   The patient does have a current PCP Ronita Hipps, MD) and does need an [facility] hospital follow-up appointment after discharge.  The patient does have transportation limitations that hinder transportation to clinic appointments.  .Services Needed at time of discharge: Y = Yes, Blank = No PT:  Home htealth  OT:   RN:   Equipment:   Other:     LOS: 2 days   Juluis Mire, MD 10/24/2014, 9:33 AM

## 2014-10-24 NOTE — Care Management Note (Signed)
Case Management Note  Patient Details  Name: Penny Curry MRN: [id number] Date of Birth: Nov 26, 1940  Subjective/Objective:     Dc planning               Action/Plan: Late entry- Home Health PT orders faxed to [facility].   Expected Discharge Date:  10/23/2014             Expected Discharge Plan:  [place]  In-House Referral:     Discharge planning Services  CM Consult  Post Acute Care Choice:  Home Health Choice offered to:     DME Arranged:    DME Agency:     HH Arranged:  PT [place]:  [place]  Status of Service:  Completed, signed off  Medicare Important Message Given:    Date Medicare IM Given:    Medicare IM give by:    Date Additional Medicare IM Given:    Additional Medicare Important Message give by:     If discussed at [place] of Stay Meetings, dates discussed:    Additional Comments:  Erenest Rasher, RN 10/24/2014, 9:10 PM

## 2014-10-24 NOTE — Progress Notes (Signed)
  Date: 10/24/2014  Patient name: Penny Curry  Medical record number: [id number]  Date of birth: 1940/09/16   This patient's plan of care was discussed with the house staff. Please see Dr. Harley Hallmark note for complete details. I concur with her findings.  Penny Curry cellulitis is much improved today.  Mental status back to baseline.  She is ready to be transitioned to oral medications and discharged home.    Sid Falcon, MD 10/24/2014, 8:24 PM

## 2014-10-25 NOTE — Discharge Summary (Signed)
Name: Penny Curry MRN: [id number] DOB: [birth year] 74 y.o. PCP: Ronita Hipps, MD  Date of Admission: 10/22/2014 11:35 AM Date of Discharge: 10/25/2014 Attending Physician: No att. providers found  Discharge Diagnosis: Principal Problem:   Cellulitis Active Problems:   Diabetes mellitus type 2, controlled   Essential hypertension   PULMONARY SARCOIDOSIS   CKD (chronic kidney disease) stage 2, GFR 60-89 ml/min   Anemia   Chronic respiratory failure with hypoxia   Obesity   Cellulitis of left leg  Discharge Medications:   Medication List    TAKE these medications        aspirin 81 MG chewable tablet  Chew 1 tablet (81 mg total) by mouth daily.     cephALEXin 500 MG capsule  Commonly known as:  KEFLEX  Take 1 capsule (500 mg total) by mouth every 6 (six) hours.     citalopram 40 MG tablet  Commonly known as:  CELEXA  Take 40 mg by mouth daily.     Colesevelam HCl 3.75 G Pack  Take 1 packet by mouth every evening.     divalproex 500 MG DR tablet  Commonly known as:  DEPAKOTE  Take 1,000 mg by mouth every evening.     ergocalciferol 50000 UNITS capsule  Commonly known as:  VITAMIN D2  Take 50,000 Units by mouth every Friday.     fluticasone 50 MCG/ACT nasal spray  Commonly known as:  FLONASE  Place 2 sprays into both nostrils daily.     gabapentin 300 MG capsule  Commonly known as:  NEURONTIN  Take 300 mg by mouth at bedtime.     HUMALOG KWIKPEN 100 UNIT/ML KiwkPen  Generic drug:  insulin lispro  Inject 11 Units into the skin 3 (three) times daily.     ibuprofen 200 MG tablet  Commonly known as:  ADVIL,MOTRIN  Take 400 mg by mouth every 6 (six) hours as needed for moderate pain.     LEVEMIR FLEXTOUCH 100 UNIT/ML Pen  Generic drug:  Insulin Detemir  Inject 40 Units into the skin daily with breakfast.     loratadine 10 MG tablet  Commonly known as:  CLARITIN  Take 1 tablet (10 mg total) by mouth daily.     pantoprazole 40 MG tablet  Commonly known  as:  PROTONIX  Take 40 mg by mouth daily.     potassium chloride 10 MEQ CR tablet  Commonly known as:  KLOR-CON  Take 20 mEq by mouth daily before breakfast.     predniSONE 20 MG tablet  Commonly known as:  DELTASONE  Take 1 tablet (20 mg total) by mouth daily with breakfast.     PROAIR HFA 108 (90 BASE) MCG/ACT inhaler  Generic drug:  albuterol  Use 2 puffs every 6 hours  as needed for wheezing or  shortness of breath     albuterol (2.5 MG/3ML) 0.083% nebulizer solution  Commonly known as:  PROVENTIL  Take 3 mLs (2.5 mg total) by nebulization 3 (three) times daily.     rOPINIRole 3 MG tablet  Commonly known as:  REQUIP  Take 3 mg by mouth 2 (two) times daily.     spironolactone 25 MG tablet  Commonly known as:  ALDACTONE  Take 25 mg by mouth 2 (two) times daily.     tetrahydrozoline-zinc 0.05-0.25 % ophthalmic solution  Commonly known as:  VISINE-AC  Place 1 drop into both eyes daily as needed (dry eyes).     Tiotropium Bromide-Olodaterol 2.5-2.5  MCG/ACT Aers  Commonly known as:  STIOLTO RESPIMAT  Inhale 2 puffs into the lungs daily.     torsemide 100 MG tablet  Commonly known as:  DEMADEX  Take 50 mg by mouth 2 (two) times daily.     traMADol 50 MG tablet  Commonly known as:  ULTRAM  Takes 1 tablet in the morning and 2 tablets at night        Disposition and follow-up:   Ms.Penny Curry was discharged from [facility] in Stable condition.  At the hospital follow up visit please address:  1.  Ms. [name] was admitted for cellulitis. Please assess for any signs of infection and if she has completed the course of Keflex. Consider decreasing dose of Celexa per pharmacy recommendations due to age.  2.  Labs / imaging needed at time of follow-up: Blood cultures  3.  Pending labs/ test needing follow-up: None  Follow-up Appointments:     Follow-up Information    Schedule an appointment as soon as possible for a visit with Ronita Hipps, MD.    Specialty:  Family Medicine   Contact information:   [place] [phone]       Discharge Instructions:   Consultations:  None  Procedures Performed:  Dg Chest 2 View  10/22/2014   CLINICAL DATA:  Cough  EXAM: CHEST  2 VIEW  COMPARISON:  CT 09/30/2014, chest radiograph 09/21/2014  FINDINGS: The patient is rotated to the left. Mild enlargement of the cardiac silhouette is noted. No pleural effusion. Diffusely prominent interstitial reticular opacities are noted. No focal pulmonary opacity. No pleural effusion. Bones are subjectively osteopenic. No acute osseous abnormality.  IMPRESSION: Diffusely prominent nonspecific interstitial lung markings without focal acute finding.   Electronically Signed   By: Conchita Paris M.D.   On: 10/22/2014 12:38   Ct Head Wo Contrast  10/22/2014   CLINICAL DATA:  Difficulty with balance  EXAM: CT HEAD WITHOUT CONTRAST  TECHNIQUE: Contiguous axial images were obtained from the base of the skull through the vertex without intravenous contrast.  COMPARISON:  April 17, 2012  FINDINGS: Mild diffuse atrophy is stable. There is no intracranial mass, hemorrhage, extra-axial fluid collection, or midline shift. There is mild patchy small vessel disease in the centra semiovale bilaterally. There are small prior lacunar type infarcts in the posterior mid left cerebellum. There is a prior small infarct in the inferior posterior right cerebellum, stable. No acute infarct evident. The bony calvarium appears intact. The mastoid air cells are clear. There is rightward deviation of the nasal septum.  IMPRESSION: Mild atrophy with patchy small vessel disease in the supratentorial infratentorial regions. 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 No acute infarct evident. No hemorrhage or mass effect. Rightward deviation of nasal septum.   Electronically Signed   By: Lowella Grip III M.D.   On: 10/22/2014 15:22     Ct Chest High Resolution  10/01/2014   CLINICAL DATA:  74 year old female with history of sarcoidosis. On oxygen for chronic shortness of breath. History of congestive heart failure.  EXAM: CT CHEST WITHOUT CONTRAST  TECHNIQUE: Multidetector CT imaging of the chest was performed following the standard protocol without intravenous contrast. High resolution imaging of the lungs, as well as inspiratory and expiratory imaging, was performed.  COMPARISON:  Chest CT 07/27/2013.  FINDINGS: Mediastinum/Lymph Nodes: Heart size is borderline enlarged with left atrial dilatation. There is no significant pericardial fluid, thickening or  pericardial calcification. There is atherosclerosis of the thoracic aorta, the great vessels of the mediastinum and the coronary arteries, including calcified atherosclerotic plaque in the left anterior descending, left circumflex and right coronary arteries. Calcifications of the aortic valve. Severe calcifications of the mitral annulus. No pathologically enlarged mediastinal or hilar lymph nodes. Please note that accurate exclusion of hilar adenopathy is limited on noncontrast CT scans. Esophagus is unremarkable in appearance. No axillary lymphadenopathy.  Lungs/Pleura: Again noted is a pattern of relatively diffuse ground-glass attenuation throughout the lungs bilaterally with scattered areas of septal thickening and mild peripheral bronchiolectasis. Very mild peripheral subpleural reticulation is also noted, predominantly in the lower lobes of the lungs. No frank honeycombing identified. Inspiratory and expiratory imaging demonstrates moderate air trapping, indicative of small airways disease. There are several more focal nodular appearing areas of ground-glass attenuation in the lungs bilaterally, the largest of which is in the posterior aspect of the left upper lobe on image 18 of series 5, which currently measures approximately 2.2 x 2.4 cm. This ground-glass attenuation area appears  slightly larger than prior studies, however, this is less solid in appearance than prior examinations (the central solid component of 6 mm on today's examination, versus nearly 12 mm on the prior study when measured in a similar fashion). The other nodular ground-glass attenuation areas are in the right upper lobe (image 13 of series 5) measuring 7 x 10 mm, in the right upper lobe (image 9 of series 5) measuring 11 x 12 mm, in the left upper lobe (image 24 of series 5) measuring 12 x 8 mm, and in the medial aspect of the right lower lobe (image 33 of series 5) measuring 9 x 9 mm, all of which are similar to prior examinations. No other new suspicious appearing pulmonary nodules or masses are noted.  Upper Abdomen: Atherosclerosis.  Musculoskeletal/Soft Tissues: There are no aggressive appearing lytic or blastic lesions noted in the visualized portions of the skeleton.  IMPRESSION: 1. The appearance of the lungs is very unusual, but is similar to prior examinations, as detailed above. There is a spectrum of findings could be seen in the setting of sarcoidosis, as sarcoidosis has many appearances, however, the findings are not classical for sarcoidosis. Findings may alternatively reflect an interstitial lung disease such as nonspecific interstitial pneumonia (NSIP). 2. Several more focal areas of somewhat nodular appearing ground-glass attenuation are noted in the lungs bilaterally, and are generally stable in size and appearance to prior studies. The exception to this is the largest lesion in the posterior aspect of the left upper lobe which appears slightly larger than the prior examination overall, but is progressively less solid in appearance over several prior examinations. While this is favored to be a benign lesion, continued attention on followup studies is recommended for all of these lesions to exclude the possibility of a slow-growing neoplasm such as adenocarcinoma. 3. Atherosclerosis, including  three-vessel coronary artery disease. Assessment for potential risk factor modification, dietary therapy or pharmacologic therapy may be warranted, if clinically indicated. 4. There are calcifications of the mitral valve and annulus, and aortic valve. Echocardiographic correlation for evaluation of potential valvular dysfunction may be warranted if clinically indicated.   Electronically Signed   By: Vinnie Langton M.D.   On: 10/01/2014 08:58   Admission HPI: Ms. Marmo is a 74 year old woman with a PMH history of HTN, insulin dependent Type 2 DM, Sarcoidosis with chronic respiratory failure presents to [facility] ED with complaints of generalized weakness,  chills, loss of balance and rash on her bilateral lower extremities for 1 week. Patient was seen by her PCP yesterday for same complaints and told to come to ED for further evaluation. She reports that she has had subjective fevers and chills for the past week. Reports she has had some loss of balance 2/2 generalized weakness in her legs with several falls in the past several days. Denies hitting her head. Does report difficulty with word findings and some slurred speech but does not have any slurred speech today. No focal deficits though does reports some blurred vision. She has had a rash on her bilateral lower extremities for the past week. Denies any trauma to the area, any wounds, or pain. Reports she had Dopplers done on both legs without any evidence of DVT. Also states she has had a non-productive cough during this time period as well.   Per ED note, family members reported some confusion for the past week with the patient believing it has been Friday all week. No family members at bedside during evaluation. Patient has been on Prednisone 20 mg daily for > 1 month. Reports running out 2-3 days ago. Recently was started on Torsemide.   CXR with diffusely prominent non-specific interstitial lung marking with no focal acute findings. CT head   Hospital Course  by problem list: Principal Problem:   Cellulitis Active Problems:   Diabetes mellitus type 2, controlled   Essential hypertension   PULMONARY SARCOIDOSIS   CKD (chronic kidney disease) stage 2, GFR 60-89 ml/min   Anemia   Chronic respiratory failure with hypoxia   Obesity   Cellulitis of left leg   Left LE Nonpurulent Cellulitis: LLE with erythema and warmth on exam. Lactic acid was slightly elevated at 2.48. No abscess or drainage. She was afebrile with a normal WBC count and no signs of Sepsis.  She reported having Dopplers done at PCP the day before admission that were negative for DVT. Patient received Vanc, Flagyl and Aztreonam in the ED. She has a non-anaphylactic allergy to Penicillin and Clindamycin. Started on Ancef 1g q6hr with no reaction. Lactic acidosis resolved with fluids. Erythema and warmth improved with no signs of systemic infection. Transitioned to oral ABX. Blood cultures NG at 3 days. Keflex 500 mg q6hr for 5 day course.   Sarcoidosis with chronic hypoxic respiratory failure: On chronic steroids for sarcoidosis. Patient reports running out of prednisone 2-3 days prior to admission. Possible recent flare due to running out of prednisone. On 3L [place] oxygen at home. Continued prednisone 20 mg daily during hospitalization and on discharge. Continued home albuterol and spiriva during hospitalization at on discharge.  Hypertension: Patient was normotensive during admission. Home meds were held during hospitalization. Resume home spironolactone 25 mg BID and torsemide 50 mg BID on discharge.  Insulin-dependent Type 2 DM: Last A1c 7.8 on 07/04/14.Put on Levemir 25 units qhs and SSI during hospitalization. Resume Levemir 40 U daily and Humalog 11 U TID before meals on discharge. Resume home gabapentin 300 mg daily at bedtime on discharge.   Depression: Continued home celexa 40 mg daily. Consider decreasing dose per pharmacy recommendations due to age.  Chronic Migraine Headache:  Continued Depakote 1000 mg daily.  Chronic Normocytic Anemia: Hg stable 9.9 on 9/3 near baseline 9-11 with no active bleeding. Last anemia panel on suggestive of mild iron-deficiency with ferritin 57 and high TIBC. Followed by her PCP.  GERD: Continued Protonix 40 mg daily.  Restless Leg Syndrome: Continued Ropinrole 3 mg  BID and Gabapentin 300 mg daily at bedtime.  Discharge Vitals:   BP 124/51 mmHg  Pulse 91  Temp(Src) 98.2 F (36.8 C) (Oral)  Resp 16  Ht 5\' 5"  (1.651 m)  Wt 204 lb (92.534 kg)  BMI 33.95 kg/m2  SpO2 99%  Discharge Labs:  No results found for this or any previous visit (from the past 24 hour(s)).  Signed: Maryellen Pile, MD 10/25/2014, 4:11 PM

## 2014-10-26 DIAGNOSIS — J8410 Pulmonary fibrosis, unspecified: Secondary | ICD-10-CM | POA: Diagnosis not present

## 2014-10-26 DIAGNOSIS — D869 Sarcoidosis, unspecified: Secondary | ICD-10-CM | POA: Diagnosis not present

## 2014-10-26 DIAGNOSIS — E1165 Type 2 diabetes mellitus with hyperglycemia: Secondary | ICD-10-CM | POA: Diagnosis not present

## 2014-10-26 DIAGNOSIS — K219 Gastro-esophageal reflux disease without esophagitis: Secondary | ICD-10-CM | POA: Diagnosis not present

## 2014-10-26 DIAGNOSIS — J209 Acute bronchitis, unspecified: Secondary | ICD-10-CM | POA: Diagnosis not present

## 2014-10-27 LAB — CULTURE, BLOOD (ROUTINE X 2)
CULTURE: NO GROWTH
CULTURE: NO GROWTH

## 2014-10-28 DIAGNOSIS — J8410 Pulmonary fibrosis, unspecified: Secondary | ICD-10-CM | POA: Diagnosis not present

## 2014-10-28 DIAGNOSIS — E1165 Type 2 diabetes mellitus with hyperglycemia: Secondary | ICD-10-CM | POA: Diagnosis not present

## 2014-10-28 DIAGNOSIS — K219 Gastro-esophageal reflux disease without esophagitis: Secondary | ICD-10-CM | POA: Diagnosis not present

## 2014-10-28 DIAGNOSIS — J209 Acute bronchitis, unspecified: Secondary | ICD-10-CM | POA: Diagnosis not present

## 2014-10-28 DIAGNOSIS — D869 Sarcoidosis, unspecified: Secondary | ICD-10-CM | POA: Diagnosis not present

## 2014-10-29 DIAGNOSIS — R4182 Altered mental status, unspecified: Secondary | ICD-10-CM | POA: Diagnosis not present

## 2014-10-29 DIAGNOSIS — L0390 Cellulitis, unspecified: Secondary | ICD-10-CM | POA: Diagnosis not present

## 2014-11-01 DIAGNOSIS — D869 Sarcoidosis, unspecified: Secondary | ICD-10-CM | POA: Diagnosis not present

## 2014-11-01 DIAGNOSIS — E1165 Type 2 diabetes mellitus with hyperglycemia: Secondary | ICD-10-CM | POA: Diagnosis not present

## 2014-11-01 DIAGNOSIS — J209 Acute bronchitis, unspecified: Secondary | ICD-10-CM | POA: Diagnosis not present

## 2014-11-01 DIAGNOSIS — J8410 Pulmonary fibrosis, unspecified: Secondary | ICD-10-CM | POA: Diagnosis not present

## 2014-11-01 DIAGNOSIS — K219 Gastro-esophageal reflux disease without esophagitis: Secondary | ICD-10-CM | POA: Diagnosis not present

## 2014-11-02 DIAGNOSIS — D869 Sarcoidosis, unspecified: Secondary | ICD-10-CM | POA: Diagnosis not present

## 2014-11-02 DIAGNOSIS — J8410 Pulmonary fibrosis, unspecified: Secondary | ICD-10-CM | POA: Diagnosis not present

## 2014-11-02 DIAGNOSIS — J209 Acute bronchitis, unspecified: Secondary | ICD-10-CM | POA: Diagnosis not present

## 2014-11-02 DIAGNOSIS — K219 Gastro-esophageal reflux disease without esophagitis: Secondary | ICD-10-CM | POA: Diagnosis not present

## 2014-11-02 DIAGNOSIS — E1165 Type 2 diabetes mellitus with hyperglycemia: Secondary | ICD-10-CM | POA: Diagnosis not present

## 2014-11-03 DIAGNOSIS — E1165 Type 2 diabetes mellitus with hyperglycemia: Secondary | ICD-10-CM | POA: Diagnosis not present

## 2014-11-03 DIAGNOSIS — D869 Sarcoidosis, unspecified: Secondary | ICD-10-CM | POA: Diagnosis not present

## 2014-11-03 DIAGNOSIS — J8410 Pulmonary fibrosis, unspecified: Secondary | ICD-10-CM | POA: Diagnosis not present

## 2014-11-03 DIAGNOSIS — J209 Acute bronchitis, unspecified: Secondary | ICD-10-CM | POA: Diagnosis not present

## 2014-11-03 DIAGNOSIS — K219 Gastro-esophageal reflux disease without esophagitis: Secondary | ICD-10-CM | POA: Diagnosis not present

## 2014-11-04 ENCOUNTER — Ambulatory Visit (HOSPITAL_COMMUNITY)
Admission: RE | Admit: 2014-11-04 | Discharge: 2014-11-04 | Disposition: A | Discharge status: Home / Self Care | Payer: Medicare Other | Source: Ambulatory Visit | Attending: Internal Medicine | Admitting: Internal Medicine

## 2014-11-04 VITALS — BP 126/64 | HR 101 | Wt 200.0 lb

## 2014-11-04 DIAGNOSIS — F329 Major depressive disorder, single episode, unspecified: Secondary | ICD-10-CM | POA: Diagnosis not present

## 2014-11-04 DIAGNOSIS — E119 Type 2 diabetes mellitus without complications: Secondary | ICD-10-CM | POA: Insufficient documentation

## 2014-11-04 DIAGNOSIS — Z833 Family history of diabetes mellitus: Secondary | ICD-10-CM | POA: Insufficient documentation

## 2014-11-04 DIAGNOSIS — Z794 Long term (current) use of insulin: Secondary | ICD-10-CM | POA: Diagnosis not present

## 2014-11-04 DIAGNOSIS — J449 Chronic obstructive pulmonary disease, unspecified: Secondary | ICD-10-CM | POA: Diagnosis not present

## 2014-11-04 DIAGNOSIS — I10 Essential (primary) hypertension: Secondary | ICD-10-CM | POA: Diagnosis not present

## 2014-11-04 DIAGNOSIS — I5032 Chronic diastolic (congestive) heart failure: Secondary | ICD-10-CM | POA: Insufficient documentation

## 2014-11-04 DIAGNOSIS — Z87891 Personal history of nicotine dependence: Secondary | ICD-10-CM | POA: Diagnosis not present

## 2014-11-04 DIAGNOSIS — J9611 Chronic respiratory failure with hypoxia: Secondary | ICD-10-CM

## 2014-11-04 DIAGNOSIS — Z79899 Other long term (current) drug therapy: Secondary | ICD-10-CM | POA: Diagnosis not present

## 2014-11-04 DIAGNOSIS — Z823 Family history of stroke: Secondary | ICD-10-CM | POA: Diagnosis not present

## 2014-11-04 DIAGNOSIS — K219 Gastro-esophageal reflux disease without esophagitis: Secondary | ICD-10-CM | POA: Insufficient documentation

## 2014-11-04 DIAGNOSIS — I270 Primary pulmonary hypertension: Secondary | ICD-10-CM

## 2014-11-04 DIAGNOSIS — D869 Sarcoidosis, unspecified: Secondary | ICD-10-CM | POA: Diagnosis not present

## 2014-11-04 DIAGNOSIS — E785 Hyperlipidemia, unspecified: Secondary | ICD-10-CM | POA: Insufficient documentation

## 2014-11-04 DIAGNOSIS — Z8249 Family history of ischemic heart disease and other diseases of the circulatory system: Secondary | ICD-10-CM | POA: Diagnosis not present

## 2014-11-04 DIAGNOSIS — I272 Pulmonary hypertension, unspecified: Secondary | ICD-10-CM

## 2014-11-04 DIAGNOSIS — Z7982 Long term (current) use of aspirin: Secondary | ICD-10-CM | POA: Diagnosis not present

## 2014-11-04 DIAGNOSIS — Z880 Allergy status to penicillin: Secondary | ICD-10-CM | POA: Diagnosis not present

## 2014-11-04 DIAGNOSIS — D860 Sarcoidosis of lung: Secondary | ICD-10-CM | POA: Diagnosis not present

## 2014-11-04 DIAGNOSIS — J9610 Chronic respiratory failure, unspecified whether with hypoxia or hypercapnia: Secondary | ICD-10-CM | POA: Diagnosis not present

## 2014-11-04 DIAGNOSIS — Z9981 Dependence on supplemental oxygen: Secondary | ICD-10-CM | POA: Diagnosis not present

## 2014-11-04 MED ORDER — METOLAZONE 2.5 MG PO TABS: 2.5000 mg | ORAL_TABLET | ORAL | Status: AC | PRN

## 2014-11-04 MED ORDER — METOLAZONE 2.5 MG PO TABS: 2.5000 mg | ORAL_TABLET | ORAL | Status: DC | PRN

## 2014-11-04 NOTE — Progress Notes (Signed)
Advanced Heart Failure Medication Review by a Pharmacist  Does the patient  feel that his/her medications are working for him/her?  yes  Has the patient been experiencing any side effects to the medications prescribed?  no  Does the patient measure his/her own blood pressure or blood glucose at home?  yes   Does the patient have any problems obtaining medications due to transportation or finances?   no  Understanding of regimen: good Understanding of indications: good Potential of compliance: fair    Pharmacist comments:  Ms. Kinkead is a pleasant 74 yo F presenting without a medication list but able to verbalize most of her medications to me. She does admit to missing her evening torsemide dose ~1-2 times per week. She did not have any specific medication-related questions or concerns for me at this time.   Ruta Hinds. Velva Harman, PharmD, BCPS, CPP Clinical Pharmacist Pager: [phone] Phone: [phone] 11/04/2014 4:07 PM

## 2014-11-04 NOTE — Patient Instructions (Signed)
Take Metolazone 2.5 mg if weight is 203 lb or greater  Take an extra 20 meq of Potassium when you take Metolazone  Your physician has requested that you have an echocardiogram. Echocardiography is a painless test that uses sound waves to create images of your heart. It provides your doctor with information about the size and shape of your heart and how well your heart's chambers and valves are working. This procedure takes approximately one hour. There are no restrictions for this procedure.  Your physician recommends that you schedule a follow-up appointment in: 6-8 weeks

## 2014-11-04 NOTE — Progress Notes (Signed)
ADVANCED HF CLINIC CONSULT NOTE  Patient ID: Penny Curry, female   DOB: 1940/04/09, 74 y.o.   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 Lamonte Sakai for further evaluation of diastolic HF and PAH.   Underwent FOB and Bx in 2005, treated with pred and cytoxan in the past. Then had nodules on arm that were bx and showed probable sarcoidosis Summer 2011 (HP). Currently on Stiolto.   She was hospitalized at [facility] this summer for diastolic HF (had echo at [facility] with reportedly normal EF) and then again 7/18-7/20 at [facility] for CP and volume overload. Myoview normal. Again admitted 9/2-05/2014 with cellulitis. Lasix recently switched to torsemide with good result.   Says she often doesn't feel well. Very fatigued. Gets SOB with mild exertion like walking to mailbox or vacuuming the floors. CP has essentially resolved. Edema well controlled with torsemide 50 bid. Using telemedicine to follow weights. And weight is very labile. Feels worse when weight is up. Drinks a lot of diet soda. Doesn't watch her salt closely. Never takes an extra fluid pill. Non-productive cough. Gets confused at times. Feels better when weight 197-200  Echo 6/15: LVEF 02-63% normal diastolic filling pattern. RV normal. No significant TR.  Mild AS and moderate MS (mean grad 6) Myoview 7/16: EF 66% normal       Review of Systems: [y] = yes, [ ]  = no   General: Weight gain Blue.Reese ]; Weight loss [ ] ; Anorexia [ ] ; Fatigue [y; Fever [ ] ; Chills [ ] ; Weakness [ ]   Cardiac: Chest pain/pressure [ ] ; Resting SOB [ ] ; Exertional SOB Blue.Reese ]; Orthopnea [ ] ; Pedal Edema Blue.Reese ]; Palpitations [ ] ; Syncope [ ] ; Presyncope [ ] ; Paroxysmal nocturnal dyspnea[ ]   Pulmonary: Cough [ y]; Wheezing[ ] ;  Hemoptysis[ ] ; Sputum [ ] ; Snoring [ ]   GI: Vomiting[ ] ; Dysphagia[ ] ; Melena[ ] ; Hematochezia [ ] ; Heartburn[ ] ; Abdominal pain [ ] ; Constipation [ ] ; Diarrhea [ ] ; BRBPR [ ]   GU: Hematuria[ ] ; Dysuria [ ] ; Nocturia[ ]   Vascular: Pain in legs with walking [ ] ; Pain in feet with lying flat [ ] ; Non-healing sores [ ] ; Stroke [ ] ; TIA [ ] ; Slurred speech [ ] ;  Neuro: Headaches[ ] ; Vertigo[ ] ; Seizures[ ] ; Paresthesias[ ] ;Blurred vision [ ] ; Diplopia [ ] ; Vision changes [ ]   Ortho/Skin: Arthritis [ y]; Joint pain Blue.Reese ]; Muscle pain [ ] ; Joint swelling [ ] ; Back Pain Blue.Reese ]; Rash [ y]  Psych: Depression[ ] ; Anxiety[ ]   Heme: Bleeding problems [ ] ; Clotting disorders [ ] ; Anemia Blue.Reese ]  Endocrine: Diabetes Blue.Reese ]; Thyroid dysfunction[ ]    Past Medical History  Diagnosis Date  . HTN (hypertension)   . Diabetes mellitus   . Fibrosis of lung   . CHF (congestive heart failure)   . GERD (gastroesophageal reflux disease)   . Chronic headache     migraines  . UTI (urinary tract infection)     " MULTIPLE TIMES THIS PAST YEAR "  . Arthritis   . Anemia   . Cataract   . COPD (chronic obstructive pulmonary disease)   . Depression   . Osteoporosis   . Hyperlipidemia     Current Outpatient Prescriptions  Medication Sig Dispense Refill  .  albuterol (PROVENTIL) (2.5 MG/3ML) 0.083% nebulizer solution Take 3 mLs (2.5 mg total) by nebulization 3 (three) times daily. 270 mL 11  . aspirin 81 MG chewable tablet Chew 1 tablet (81 mg total) by mouth daily. 30 tablet 0  . citalopram (CELEXA) 40 MG tablet Take 40 mg by mouth daily.    . Colesevelam HCl 3.75 G PACK Take 1 packet by mouth every evening.    . divalproex (DEPAKOTE) 500 MG EC tablet Take 1,000 mg by mouth every evening.     . ergocalciferol (VITAMIN D2) 50000 UNITS capsule Take 50,000 Units by mouth every Friday.    . fluticasone (FLONASE) 50 MCG/ACT nasal spray Place 2 sprays into both nostrils daily. (Patient taking differently: Place 2 sprays into  both nostrils 2 (two) times daily. ) 48 g 1  . gabapentin (NEURONTIN) 300 MG capsule Take 300 mg by mouth at bedtime.    Marland Kitchen HUMALOG KWIKPEN 100 UNIT/ML KiwkPen Inject 11 Units into the skin 3 (three) times daily.     Marland Kitchen ibuprofen (ADVIL,MOTRIN) 200 MG tablet Take 400 mg by mouth every 6 (six) hours as needed for moderate pain.    Marland Kitchen LEVEMIR FLEXTOUCH 100 UNIT/ML Pen Inject 40 Units into the skin daily with breakfast.     . loratadine (CLARITIN) 10 MG tablet Take 1 tablet (10 mg total) by mouth daily. 30 tablet 11  . pantoprazole (PROTONIX) 40 MG tablet Take 40 mg by mouth daily.     . potassium chloride (KLOR-CON) 10 MEQ CR tablet Take 20 mEq by mouth daily before breakfast.     . predniSONE (DELTASONE) 20 MG tablet Take 1 tablet (20 mg total) by mouth daily with breakfast. 30 tablet 1  . PROAIR HFA 108 (90 BASE) MCG/ACT inhaler Use 2 puffs every 6 hours  as needed for wheezing or  shortness of breath 3 Inhaler 1  . rOPINIRole (REQUIP) 3 MG tablet Take 3 mg by mouth 2 (two) times daily.      Marland Kitchen spironolactone (ALDACTONE) 25 MG tablet Take 25 mg by mouth 2 (two) times daily.     Marland Kitchen tetrahydrozoline-zinc (VISINE-AC) 0.05-0.25 % ophthalmic solution Place 1 drop into both eyes daily as needed (dry eyes).    . Tiotropium Bromide-Olodaterol (STIOLTO RESPIMAT) 2.5-2.5 MCG/ACT AERS Inhale 2 puffs into the lungs daily. 12 g 1  . torsemide (DEMADEX) 100 MG tablet Take 50 mg by mouth 2 (two) times daily.    . traMADol (ULTRAM) 50 MG tablet Takes 1 tablet in the morning and 2 tablets at night     No current facility-administered medications for this encounter.    Allergies  Allergen Reactions  . Clindamycin/Lincomycin Swelling and Rash  . Penicillins Swelling and Rash     high fever  . Tape Itching and Rash    Paper tape is ok.      Social History   Social History  . Marital Status: Widowed    Spouse Name: N/A  . Number of Children: 4  . Years of Education: N/A   Occupational History  .  disabled    Social History Main Topics  . Smoking status: Former Smoker -- 1.00 packs/day for 8 years    Types: Cigarettes    Quit date: 02/20/1968  . Smokeless tobacco: Never Used  . Alcohol Use: No  . Drug Use: No  . Sexual Activity: Not on file   Other Topics Concern  . Not on file   Social History Narrative  Family History  Problem Relation Age of Onset  . Prostate cancer Brother   . Heart disease Brother     MI at age 52  . Stroke Mother     stroke at age 85  . Diabetes Mother   . Asthma Son   . Emphysema Father   . Heart disease Father   . Lung cancer Sister   . Heart disease Sister     MI at 74  . Heart disease Brother     all three brothers have had MI    Filed Vitals:   11/04/14 1538  BP: 126/64  Pulse: 101  Weight: 200 lb (90.719 kg)  SpO2: 97%    PHYSICAL EXAM: General:  Elderly woman on oxygen  No respiratory difficulty HEENT: normal Neck: supple. thick. Carotids 2+ bilat; no bruits. No lymphadenopathy or thryomegaly appreciated. Cor: PMI nondisplaced. Regular rate & rhythm. 2/6 AS Lungs: clear with decrease BS throughout Abdomen:obese  soft, nontender, nondistended. No hepatosplenomegaly. No bruits or masses. Good bowel sounds. Extremities: no cyanosis, clubbing, rash, tr edema Neuro: alert & oriented x 3, cranial nerves grossly intact. moves all 4 extremities w/o difficulty. Affect pleasant.   ASSESSMENT & PLAN: 1. Chronic diastolic HF 2. Chronic respiratory failure 3. Pulmonary sarcoid  4. HTN  5. Morbid obesity  I suspect her symptoms are multifactorial. She clearly has severe underlying lung disease but also has component of diastolic HF. She feels much better when she is diuresed and torsemide seems to be working better than lasix. Despite her pulmonary sarcoid previous echos have not suggested PAH or RV dysfunction. I have stressed to her the need to limit her fluid intake and need for daily weights and reviewed use of sliding scale  diuretics. Dry weight seems to be 197-200 pounds. Will have her take metolazone 2.5 mg with kcl 20 for weight of 203 or greater. Will repeat echo. If symptoms get worse or echo suggests RV strain can proceed with RHC.  Gina Costilla,MD 6:57 PM

## 2014-11-08 DIAGNOSIS — E1165 Type 2 diabetes mellitus with hyperglycemia: Secondary | ICD-10-CM | POA: Diagnosis not present

## 2014-11-08 DIAGNOSIS — K219 Gastro-esophageal reflux disease without esophagitis: Secondary | ICD-10-CM | POA: Diagnosis not present

## 2014-11-08 DIAGNOSIS — J209 Acute bronchitis, unspecified: Secondary | ICD-10-CM | POA: Diagnosis not present

## 2014-11-08 DIAGNOSIS — D869 Sarcoidosis, unspecified: Secondary | ICD-10-CM | POA: Diagnosis not present

## 2014-11-08 DIAGNOSIS — J8410 Pulmonary fibrosis, unspecified: Secondary | ICD-10-CM | POA: Diagnosis not present

## 2014-11-09 DIAGNOSIS — K219 Gastro-esophageal reflux disease without esophagitis: Secondary | ICD-10-CM | POA: Diagnosis not present

## 2014-11-09 DIAGNOSIS — D869 Sarcoidosis, unspecified: Secondary | ICD-10-CM | POA: Diagnosis not present

## 2014-11-09 DIAGNOSIS — J209 Acute bronchitis, unspecified: Secondary | ICD-10-CM | POA: Diagnosis not present

## 2014-11-09 DIAGNOSIS — E1165 Type 2 diabetes mellitus with hyperglycemia: Secondary | ICD-10-CM | POA: Diagnosis not present

## 2014-11-09 DIAGNOSIS — J8410 Pulmonary fibrosis, unspecified: Secondary | ICD-10-CM | POA: Diagnosis not present

## 2014-11-10 DIAGNOSIS — K219 Gastro-esophageal reflux disease without esophagitis: Secondary | ICD-10-CM | POA: Diagnosis not present

## 2014-11-10 DIAGNOSIS — J8410 Pulmonary fibrosis, unspecified: Secondary | ICD-10-CM | POA: Diagnosis not present

## 2014-11-10 DIAGNOSIS — J209 Acute bronchitis, unspecified: Secondary | ICD-10-CM | POA: Diagnosis not present

## 2014-11-10 DIAGNOSIS — E1165 Type 2 diabetes mellitus with hyperglycemia: Secondary | ICD-10-CM | POA: Diagnosis not present

## 2014-11-10 DIAGNOSIS — D869 Sarcoidosis, unspecified: Secondary | ICD-10-CM | POA: Diagnosis not present

## 2014-11-12 ENCOUNTER — Other Ambulatory Visit: Payer: Self-pay

## 2014-11-12 ENCOUNTER — Ambulatory Visit (HOSPITAL_COMMUNITY): Payer: Medicare Other | Attending: Cardiology

## 2014-11-12 DIAGNOSIS — E119 Type 2 diabetes mellitus without complications: Secondary | ICD-10-CM | POA: Insufficient documentation

## 2014-11-12 DIAGNOSIS — I071 Rheumatic tricuspid insufficiency: Secondary | ICD-10-CM | POA: Insufficient documentation

## 2014-11-12 DIAGNOSIS — N182 Chronic kidney disease, stage 2 (mild): Secondary | ICD-10-CM | POA: Insufficient documentation

## 2014-11-12 DIAGNOSIS — I270 Primary pulmonary hypertension: Secondary | ICD-10-CM

## 2014-11-12 DIAGNOSIS — I352 Nonrheumatic aortic (valve) stenosis with insufficiency: Secondary | ICD-10-CM | POA: Insufficient documentation

## 2014-11-12 DIAGNOSIS — I340 Nonrheumatic mitral (valve) insufficiency: Secondary | ICD-10-CM | POA: Diagnosis not present

## 2014-11-12 DIAGNOSIS — I272 Other secondary pulmonary hypertension: Secondary | ICD-10-CM | POA: Insufficient documentation

## 2014-11-12 DIAGNOSIS — F17200 Nicotine dependence, unspecified, uncomplicated: Secondary | ICD-10-CM | POA: Diagnosis not present

## 2014-11-12 DIAGNOSIS — I517 Cardiomegaly: Secondary | ICD-10-CM | POA: Insufficient documentation

## 2014-11-12 DIAGNOSIS — I129 Hypertensive chronic kidney disease with stage 1 through stage 4 chronic kidney disease, or unspecified chronic kidney disease: Secondary | ICD-10-CM | POA: Insufficient documentation

## 2014-11-12 NOTE — Progress Notes (Signed)
Patient's heart rate cycled between ~85 bpm and ~125 bpm throughout the exam. Patient felt mild short of breath when her heart rate was in the 120's and back to normal in the 80's.  DOD (Dr. Ron Parker) was notified and was told to discharge the patient and note it in the chart.  11/12/2014

## 2014-11-15 DIAGNOSIS — J8410 Pulmonary fibrosis, unspecified: Secondary | ICD-10-CM | POA: Diagnosis not present

## 2014-11-15 DIAGNOSIS — D869 Sarcoidosis, unspecified: Secondary | ICD-10-CM | POA: Diagnosis not present

## 2014-11-15 DIAGNOSIS — J209 Acute bronchitis, unspecified: Secondary | ICD-10-CM | POA: Diagnosis not present

## 2014-11-15 DIAGNOSIS — K219 Gastro-esophageal reflux disease without esophagitis: Secondary | ICD-10-CM | POA: Diagnosis not present

## 2014-11-15 DIAGNOSIS — E1165 Type 2 diabetes mellitus with hyperglycemia: Secondary | ICD-10-CM | POA: Diagnosis not present

## 2014-11-17 DIAGNOSIS — J209 Acute bronchitis, unspecified: Secondary | ICD-10-CM | POA: Diagnosis not present

## 2014-11-17 DIAGNOSIS — E1165 Type 2 diabetes mellitus with hyperglycemia: Secondary | ICD-10-CM | POA: Diagnosis not present

## 2014-11-17 DIAGNOSIS — D869 Sarcoidosis, unspecified: Secondary | ICD-10-CM | POA: Diagnosis not present

## 2014-11-17 DIAGNOSIS — J8410 Pulmonary fibrosis, unspecified: Secondary | ICD-10-CM | POA: Diagnosis not present

## 2014-11-17 DIAGNOSIS — K219 Gastro-esophageal reflux disease without esophagitis: Secondary | ICD-10-CM | POA: Diagnosis not present

## 2014-11-18 ENCOUNTER — Telehealth (HOSPITAL_COMMUNITY): Payer: Self-pay | Admitting: Vascular Surgery

## 2014-11-18 NOTE — Telephone Encounter (Signed)
Pt called for echo results.. Please advise

## 2014-11-19 DIAGNOSIS — K219 Gastro-esophageal reflux disease without esophagitis: Secondary | ICD-10-CM | POA: Diagnosis not present

## 2014-11-19 DIAGNOSIS — D869 Sarcoidosis, unspecified: Secondary | ICD-10-CM | POA: Diagnosis not present

## 2014-11-19 DIAGNOSIS — E1165 Type 2 diabetes mellitus with hyperglycemia: Secondary | ICD-10-CM | POA: Diagnosis not present

## 2014-11-19 DIAGNOSIS — J209 Acute bronchitis, unspecified: Secondary | ICD-10-CM | POA: Diagnosis not present

## 2014-11-19 DIAGNOSIS — J8410 Pulmonary fibrosis, unspecified: Secondary | ICD-10-CM | POA: Diagnosis not present

## 2014-11-19 DIAGNOSIS — J449 Chronic obstructive pulmonary disease, unspecified: Secondary | ICD-10-CM | POA: Diagnosis not present

## 2014-11-20 ENCOUNTER — Other Ambulatory Visit: Payer: Self-pay | Admitting: Emergency Medicine

## 2014-11-20 DIAGNOSIS — I270 Primary pulmonary hypertension: Secondary | ICD-10-CM | POA: Diagnosis not present

## 2014-11-20 DIAGNOSIS — I10 Essential (primary) hypertension: Secondary | ICD-10-CM | POA: Diagnosis not present

## 2014-11-20 DIAGNOSIS — J8410 Pulmonary fibrosis, unspecified: Secondary | ICD-10-CM | POA: Diagnosis not present

## 2014-11-20 DIAGNOSIS — E119 Type 2 diabetes mellitus without complications: Secondary | ICD-10-CM | POA: Diagnosis not present

## 2014-11-20 DIAGNOSIS — K219 Gastro-esophageal reflux disease without esophagitis: Secondary | ICD-10-CM | POA: Diagnosis not present

## 2014-11-20 DIAGNOSIS — I509 Heart failure, unspecified: Secondary | ICD-10-CM | POA: Diagnosis not present

## 2014-11-23 DIAGNOSIS — I509 Heart failure, unspecified: Secondary | ICD-10-CM | POA: Diagnosis not present

## 2014-11-23 DIAGNOSIS — I270 Primary pulmonary hypertension: Secondary | ICD-10-CM | POA: Diagnosis not present

## 2014-11-23 DIAGNOSIS — J8410 Pulmonary fibrosis, unspecified: Secondary | ICD-10-CM | POA: Diagnosis not present

## 2014-11-23 DIAGNOSIS — I10 Essential (primary) hypertension: Secondary | ICD-10-CM | POA: Diagnosis not present

## 2014-11-23 DIAGNOSIS — E119 Type 2 diabetes mellitus without complications: Secondary | ICD-10-CM | POA: Diagnosis not present

## 2014-11-23 DIAGNOSIS — K219 Gastro-esophageal reflux disease without esophagitis: Secondary | ICD-10-CM | POA: Diagnosis not present

## 2014-11-25 ENCOUNTER — Telehealth: Payer: Self-pay | Admitting: Emergency Medicine

## 2014-11-25 NOTE — Telephone Encounter (Signed)
Spoke with Juliann Pulse at [facility]. She had some questions about pt's medications. These questions were answered. Nothing further was needed.

## 2014-11-26 DIAGNOSIS — J22 Unspecified acute lower respiratory infection: Secondary | ICD-10-CM | POA: Diagnosis not present

## 2014-11-26 DIAGNOSIS — R509 Fever, unspecified: Secondary | ICD-10-CM | POA: Diagnosis not present

## 2014-11-26 DIAGNOSIS — D869 Sarcoidosis, unspecified: Secondary | ICD-10-CM | POA: Diagnosis not present

## 2014-11-29 DIAGNOSIS — I509 Heart failure, unspecified: Secondary | ICD-10-CM | POA: Diagnosis not present

## 2014-11-29 DIAGNOSIS — J8410 Pulmonary fibrosis, unspecified: Secondary | ICD-10-CM | POA: Diagnosis not present

## 2014-12-02 DIAGNOSIS — I10 Essential (primary) hypertension: Secondary | ICD-10-CM | POA: Diagnosis not present

## 2014-12-02 DIAGNOSIS — I270 Primary pulmonary hypertension: Secondary | ICD-10-CM | POA: Diagnosis not present

## 2014-12-02 DIAGNOSIS — I509 Heart failure, unspecified: Secondary | ICD-10-CM | POA: Diagnosis not present

## 2014-12-02 DIAGNOSIS — K219 Gastro-esophageal reflux disease without esophagitis: Secondary | ICD-10-CM | POA: Diagnosis not present

## 2014-12-02 DIAGNOSIS — J8410 Pulmonary fibrosis, unspecified: Secondary | ICD-10-CM | POA: Diagnosis not present

## 2014-12-02 DIAGNOSIS — E119 Type 2 diabetes mellitus without complications: Secondary | ICD-10-CM | POA: Diagnosis not present

## 2014-12-09 ENCOUNTER — Inpatient Hospital Stay (HOSPITAL_COMMUNITY)
Admission: EM | Admit: 2014-12-09 | Discharge: 2014-12-16 | DRG: 190 | Disposition: A | Discharge status: Home / Self Care | Payer: Medicare Other | Attending: Internal Medicine | Admitting: Internal Medicine

## 2014-12-09 ENCOUNTER — Emergency Department (HOSPITAL_COMMUNITY): Payer: Medicare Other

## 2014-12-09 ENCOUNTER — Other Ambulatory Visit: Payer: Self-pay

## 2014-12-09 ENCOUNTER — Encounter (HOSPITAL_COMMUNITY): Payer: Self-pay | Admitting: Neurology

## 2014-12-09 DIAGNOSIS — Z823 Family history of stroke: Secondary | ICD-10-CM

## 2014-12-09 DIAGNOSIS — Z801 Family history of malignant neoplasm of trachea, bronchus and lung: Secondary | ICD-10-CM

## 2014-12-09 DIAGNOSIS — Z7952 Long term (current) use of systemic steroids: Secondary | ICD-10-CM | POA: Diagnosis not present

## 2014-12-09 DIAGNOSIS — M810 Age-related osteoporosis without current pathological fracture: Secondary | ICD-10-CM | POA: Diagnosis not present

## 2014-12-09 DIAGNOSIS — E871 Hypo-osmolality and hyponatremia: Secondary | ICD-10-CM | POA: Diagnosis not present

## 2014-12-09 DIAGNOSIS — N179 Acute kidney failure, unspecified: Secondary | ICD-10-CM | POA: Diagnosis present

## 2014-12-09 DIAGNOSIS — J9611 Chronic respiratory failure with hypoxia: Secondary | ICD-10-CM

## 2014-12-09 DIAGNOSIS — I509 Heart failure, unspecified: Secondary | ICD-10-CM | POA: Diagnosis not present

## 2014-12-09 DIAGNOSIS — R05 Cough: Secondary | ICD-10-CM | POA: Diagnosis not present

## 2014-12-09 DIAGNOSIS — E1165 Type 2 diabetes mellitus with hyperglycemia: Secondary | ICD-10-CM | POA: Diagnosis not present

## 2014-12-09 DIAGNOSIS — Z7982 Long term (current) use of aspirin: Secondary | ICD-10-CM | POA: Diagnosis not present

## 2014-12-09 DIAGNOSIS — E785 Hyperlipidemia, unspecified: Secondary | ICD-10-CM | POA: Diagnosis not present

## 2014-12-09 DIAGNOSIS — E1122 Type 2 diabetes mellitus with diabetic chronic kidney disease: Secondary | ICD-10-CM | POA: Diagnosis present

## 2014-12-09 DIAGNOSIS — N182 Chronic kidney disease, stage 2 (mild): Secondary | ICD-10-CM | POA: Diagnosis not present

## 2014-12-09 DIAGNOSIS — J440 Chronic obstructive pulmonary disease with acute lower respiratory infection: Secondary | ICD-10-CM | POA: Diagnosis not present

## 2014-12-09 DIAGNOSIS — E876 Hypokalemia: Secondary | ICD-10-CM | POA: Diagnosis not present

## 2014-12-09 DIAGNOSIS — I130 Hypertensive heart and chronic kidney disease with heart failure and stage 1 through stage 4 chronic kidney disease, or unspecified chronic kidney disease: Secondary | ICD-10-CM | POA: Diagnosis not present

## 2014-12-09 DIAGNOSIS — I10 Essential (primary) hypertension: Secondary | ICD-10-CM | POA: Diagnosis present

## 2014-12-09 DIAGNOSIS — J209 Acute bronchitis, unspecified: Secondary | ICD-10-CM | POA: Diagnosis present

## 2014-12-09 DIAGNOSIS — E860 Dehydration: Secondary | ICD-10-CM | POA: Diagnosis not present

## 2014-12-09 DIAGNOSIS — Z66 Do not resuscitate: Secondary | ICD-10-CM | POA: Diagnosis present

## 2014-12-09 DIAGNOSIS — Z833 Family history of diabetes mellitus: Secondary | ICD-10-CM

## 2014-12-09 DIAGNOSIS — I270 Primary pulmonary hypertension: Secondary | ICD-10-CM | POA: Diagnosis not present

## 2014-12-09 DIAGNOSIS — I350 Nonrheumatic aortic (valve) stenosis: Secondary | ICD-10-CM | POA: Diagnosis not present

## 2014-12-09 DIAGNOSIS — T502X5A Adverse effect of carbonic-anhydrase inhibitors, benzothiadiazides and other diuretics, initial encounter: Secondary | ICD-10-CM | POA: Diagnosis present

## 2014-12-09 DIAGNOSIS — J22 Unspecified acute lower respiratory infection: Secondary | ICD-10-CM | POA: Diagnosis not present

## 2014-12-09 DIAGNOSIS — Z8249 Family history of ischemic heart disease and other diseases of the circulatory system: Secondary | ICD-10-CM

## 2014-12-09 DIAGNOSIS — Z825 Family history of asthma and other chronic lower respiratory diseases: Secondary | ICD-10-CM

## 2014-12-09 DIAGNOSIS — J441 Chronic obstructive pulmonary disease with (acute) exacerbation: Secondary | ICD-10-CM | POA: Diagnosis not present

## 2014-12-09 DIAGNOSIS — I272 Other secondary pulmonary hypertension: Secondary | ICD-10-CM | POA: Diagnosis not present

## 2014-12-09 DIAGNOSIS — I5032 Chronic diastolic (congestive) heart failure: Secondary | ICD-10-CM | POA: Diagnosis not present

## 2014-12-09 DIAGNOSIS — R0602 Shortness of breath: Secondary | ICD-10-CM | POA: Diagnosis not present

## 2014-12-09 DIAGNOSIS — J9621 Acute and chronic respiratory failure with hypoxia: Secondary | ICD-10-CM | POA: Diagnosis not present

## 2014-12-09 DIAGNOSIS — Z87891 Personal history of nicotine dependence: Secondary | ICD-10-CM | POA: Diagnosis not present

## 2014-12-09 DIAGNOSIS — K219 Gastro-esophageal reflux disease without esophagitis: Secondary | ICD-10-CM | POA: Diagnosis not present

## 2014-12-09 DIAGNOSIS — J8410 Pulmonary fibrosis, unspecified: Secondary | ICD-10-CM | POA: Diagnosis not present

## 2014-12-09 DIAGNOSIS — Z9981 Dependence on supplemental oxygen: Secondary | ICD-10-CM

## 2014-12-09 DIAGNOSIS — E119 Type 2 diabetes mellitus without complications: Secondary | ICD-10-CM

## 2014-12-09 DIAGNOSIS — M1990 Unspecified osteoarthritis, unspecified site: Secondary | ICD-10-CM | POA: Diagnosis present

## 2014-12-09 DIAGNOSIS — Z794 Long term (current) use of insulin: Secondary | ICD-10-CM | POA: Diagnosis not present

## 2014-12-09 DIAGNOSIS — I4891 Unspecified atrial fibrillation: Secondary | ICD-10-CM | POA: Diagnosis not present

## 2014-12-09 DIAGNOSIS — R079 Chest pain, unspecified: Secondary | ICD-10-CM

## 2014-12-09 DIAGNOSIS — T380X5A Adverse effect of glucocorticoids and synthetic analogues, initial encounter: Secondary | ICD-10-CM | POA: Diagnosis not present

## 2014-12-09 DIAGNOSIS — I480 Paroxysmal atrial fibrillation: Secondary | ICD-10-CM | POA: Diagnosis present

## 2014-12-09 LAB — BASIC METABOLIC PANEL
Anion gap: 13 (ref 5–15)
BUN: 32 mg/dL — ABNORMAL HIGH (ref 6–20)
CHLORIDE: 84 mmol/L — AB (ref 101–111)
CO2: 32 mmol/L (ref 22–32)
CREATININE: 1.51 mg/dL — AB (ref 0.44–1.00)
Calcium: 9.8 mg/dL (ref 8.9–10.3)
GFR calc non Af Amer: 33 mL/min — ABNORMAL LOW (ref >60)
GFR, EST AFRICAN AMERICAN: 38 mL/min — AB (ref >60)
Glucose, Bld: 309 mg/dL — ABNORMAL HIGH (ref 65–99)
Potassium: 4.4 mmol/L (ref 3.5–5.1)
Sodium: 129 mmol/L — ABNORMAL LOW (ref 135–145)

## 2014-12-09 LAB — CBC WITH DIFFERENTIAL/PLATELET
BASOS PCT: 0 %
Basophils Absolute: 0 10*3/uL (ref 0.0–0.1)
Eosinophils Absolute: 0 10*3/uL (ref 0.0–0.7)
Eosinophils Relative: 0 %
HEMATOCRIT: 36.2 % (ref 36.0–46.0)
HEMOGLOBIN: 12.2 g/dL (ref 12.0–15.0)
LYMPHS ABS: 2.1 10*3/uL (ref 0.7–4.0)
LYMPHS PCT: 17 %
MCH: 32.4 pg (ref 26.0–34.0)
MCHC: 33.7 g/dL (ref 30.0–36.0)
MCV: 96 fL (ref 78.0–100.0)
MONOS PCT: 5 %
Monocytes Absolute: 0.6 10*3/uL (ref 0.1–1.0)
NEUTROS ABS: 9.9 10*3/uL — AB (ref 1.7–7.7)
Neutrophils Relative %: 78 %
Platelets: 232 10*3/uL (ref 150–400)
RBC: 3.77 MIL/uL — AB (ref 3.87–5.11)
RDW: 13.5 % (ref 11.5–15.5)
WBC: 12.6 10*3/uL — ABNORMAL HIGH (ref 4.0–10.5)

## 2014-12-09 LAB — URINALYSIS, ROUTINE W REFLEX MICROSCOPIC
Bilirubin Urine: NEGATIVE
Glucose, UA: >1000 mg/dL — AB
Hgb urine dipstick: NEGATIVE
KETONES UR: NEGATIVE mg/dL
LEUKOCYTES UA: NEGATIVE
NITRITE: NEGATIVE
PH: 7 (ref 5.0–8.0)
PROTEIN: NEGATIVE mg/dL
Specific Gravity, Urine: 1.016 (ref 1.005–1.030)
Urobilinogen, UA: 0.2 mg/dL (ref 0.0–1.0)

## 2014-12-09 LAB — I-STAT TROPONIN, ED
Troponin i, poc: 0 ng/mL (ref 0.00–0.08)
Troponin i, poc: 0.01 ng/mL (ref 0.00–0.08)

## 2014-12-09 LAB — URINE MICROSCOPIC-ADD ON

## 2014-12-09 LAB — BRAIN NATRIURETIC PEPTIDE: B Natriuretic Peptide: 93.1 pg/mL (ref 0.0–100.0)

## 2014-12-09 MED ORDER — FLUCONAZOLE 100 MG PO TABS
150.0000 mg | ORAL_TABLET | Freq: Once | ORAL | Status: AC
  Administered 2014-12-10: 150 mg via ORAL
  Filled 2014-12-09: qty 2

## 2014-12-09 MED ORDER — IPRATROPIUM-ALBUTEROL 0.5-2.5 (3) MG/3ML IN SOLN
3.0000 mL | RESPIRATORY_TRACT | Status: AC
  Administered 2014-12-09: 3 mL via RESPIRATORY_TRACT
  Filled 2014-12-09 (×2): qty 3

## 2014-12-09 NOTE — ED Provider Notes (Signed)
CSN: [id number]     Arrival date & time 12/09/14  1644 History   First MD Initiated Contact with Patient 12/09/14 1724     Chief Complaint  Patient presents with  . Shortness of Breath     Patient is a 74 y.o. female presenting with shortness of breath. The history is provided by the patient and medical records.  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 HTN (hypertension)   . Diabetes mellitus   . Fibrosis of lung ([place])   . CHF (congestive heart failure) ([place])   . GERD (gastroesophageal reflux disease)   . Chronic headache     migraines  . UTI (urinary tract infection)     " MULTIPLE TIMES THIS PAST YEAR "  . Arthritis   . Anemia   . Cataract   . COPD (chronic obstructive pulmonary disease) ([place])   . Depression   . Osteoporosis   . Hyperlipidemia    Past Surgical History  Procedure Laterality Date  . Neck surgery  1987  . Partial hysterectomy    . Tubal ligation    . Cataract extraction    . Abdominal hysterectomy     Family History  Problem Relation Age of Onset  . Prostate cancer Brother   . Heart disease Brother     MI at age 54  . Stroke Mother     stroke at age 65  . Diabetes Mother   . Asthma Son   . Emphysema Father   . Heart disease Father   . Lung cancer Sister   . Heart disease Sister     MI at 62  . Heart disease Brother     all three brothers have had MI   Social History  Substance Use Topics   . Smoking status: Former Smoker -- 1.00 packs/day for 8 years    Types: Cigarettes    Quit date: 02/20/1968  . Smokeless tobacco: Never Used  . Alcohol Use: No   OB History    No data available     Review of Systems  Constitutional: Positive for fever ("a little over 100"), chills and fatigue.  HENT: Negative for rhinorrhea.   Eyes: Negative for visual disturbance.  Respiratory: Positive for cough, sputum production (dark yellow, green), shortness of breath and wheezing. Negative for hemoptysis.   Cardiovascular: Positive for chest pain (only when coughing).  Gastrointestinal: Negative for vomiting and abdominal pain.  Genitourinary: Positive for dysuria and vaginal discharge ("I think I have a yeast infection"). Negative for decreased urine volume.  Skin: Negative for rash.  Allergic/Immunologic: Positive for immunocompromised state (chronic steroids).  Neurological: Negative for syncope.  Psychiatric/Behavioral: Negative for confusion.    Allergies  Clindamycin/lincomycin; Penicillins; and Tape  Home Medications   Prior to Admission medications   Medication Sig Start Date End Date Taking? Authorizing Provider  albuterol (PROVENTIL) (2.5 MG/3ML) 0.083% nebulizer solution Take 3 mLs (2.5 mg total) by nebulization 3 (three) times daily.  09/14/14  Yes Collene Gobble, MD  aspirin 81 MG chewable tablet Chew 1 tablet (81 mg total) by mouth daily. 09/07/14  Yes Lucious Groves, DO  Benzonatate (TESSALON PO) Take 1 capsule by mouth daily as needed (cough).   Yes Historical Provider, MD  citalopram (CELEXA) 40 MG tablet Take 40 mg by mouth at bedtime.    Yes Historical Provider, MD  Colesevelam HCl 3.75 G PACK Take 1 packet by mouth every evening.   Yes Historical Provider, MD  divalproex (DEPAKOTE ER) 500 MG 24 hr tablet Take 1,000 mg by mouth at bedtime.   Yes Historical Provider, MD  ergocalciferol (VITAMIN D2) 50000 UNITS capsule Take 50,000 Units by mouth every Friday.   Yes  Historical Provider, MD  fluticasone (FLONASE) 50 MCG/ACT nasal spray Use 2 sprays in each  nostril daily Patient taking differently: Use 2 sprays in each  nostril twice daily 11/22/14  Yes Collene Gobble, MD  gabapentin (NEURONTIN) 300 MG capsule Take 300 mg by mouth at bedtime.   Yes Historical Provider, MD  HUMALOG KWIKPEN 100 UNIT/ML KiwkPen Inject 11 Units into the skin 3 (three) times daily.  08/09/14  Yes Historical Provider, MD  LEVEMIR FLEXTOUCH 100 UNIT/ML Pen Inject 40 Units into the skin daily with breakfast.  08/09/14  Yes Historical Provider, MD  loratadine (CLARITIN) 10 MG tablet Take 1 tablet (10 mg total) by mouth daily. Patient taking differently: Take 10 mg by mouth daily as needed for allergies or rhinitis.  02/05/14  Yes Collene Gobble, MD  metolazone (ZAROXOLYN) 2.5 MG tablet Take 1 tablet (2.5 mg total) by mouth as needed (for weight 203 or greater). Patient taking differently: Take 2.5 mg by mouth as needed (for weight 195 or greater).  11/04/14  Yes Shaune Pascal Bensimhon, MD  ondansetron (ZOFRAN-ODT) 8 MG disintegrating tablet Take 8 mg by mouth every 6 (six) hours as needed for nausea or vomiting.  11/26/14  Yes Historical Provider, MD  OXYGEN Inhale 3 L into the lungs continuous.   Yes Historical Provider, MD  pantoprazole (PROTONIX) 40 MG tablet Take 40 mg by mouth daily.    Yes Historical Provider, MD  potassium chloride (KLOR-CON) 10 MEQ CR tablet Take 20 mEq by mouth daily before breakfast.    Yes Historical Provider, MD  predniSONE (DELTASONE) 20 MG tablet Take 1 tablet (20 mg total) by mouth daily with breakfast. 10/24/14  Yes Juluis Mire, MD  PROAIR HFA 108 (90 BASE) MCG/ACT inhaler Use 2 puffs every 6 hours  as needed for wheezing or  shortness of breath 08/09/14  Yes Collene Gobble, MD  rOPINIRole (REQUIP) 3 MG tablet Take 3 mg by mouth 2 (two) times daily.     Yes Historical Provider, MD  spironolactone (ALDACTONE) 25 MG tablet Take 25 mg by mouth 2 (two) times daily.   11/12/13  Yes Historical Provider, MD  STIOLTO RESPIMAT 2.5-2.5 MCG/ACT AERS Inhale 2 puffs into the  lungs daily 11/22/14  Yes Collene Gobble, MD  tetrahydrozoline-zinc (VISINE-AC) 0.05-0.25 % ophthalmic solution Place 1 drop into both eyes daily as needed (dry eyes).   Yes Historical Provider, MD  torsemide (DEMADEX) 100 MG tablet Take 50 mg by mouth 2 (two) times daily. 10/04/14  Yes Historical Provider, MD  traMADol (ULTRAM) 50 MG tablet Take 50-100 mg by mouth 2 (two) times daily. Takes 1 tablet in the morning and 2 tablets at night 05/23/11  Yes Tanda Rockers, MD   BP 138/69 mmHg  Pulse 96  Temp(Src) 98.3 F (36.8 C) (Oral)  Resp 18  SpO2 97% Physical Exam  Constitutional: She is oriented to person, place, and time. She appears well-developed and well-nourished. No distress.  HENT:  Head: Normocephalic and atraumatic.  Eyes: Right eye exhibits no discharge. Left eye exhibits no discharge.  Neck: No tracheal deviation present.  Cardiovascular: Regular rhythm.  Tachycardia present.   Pulmonary/Chest: Effort normal. No respiratory distress. She has wheezes.  Abdominal: Soft. She exhibits no distension. There is no tenderness.  Musculoskeletal: She exhibits no edema.  Neurological: She is alert and oriented to person, place, and time.  Skin: Skin is warm and dry.  Psychiatric: She has a normal mood and affect. Her behavior is normal.  Nursing note and vitals reviewed.   ED Course  Procedures (including critical care time) Labs Review Labs Reviewed  CBC WITH DIFFERENTIAL/PLATELET - Abnormal; Notable for the following:    WBC 12.6 (*)    RBC 3.77 (*)    Neutro Abs 9.9 (*)    All other components within normal limits  BASIC METABOLIC PANEL - Abnormal; Notable for the following:    Sodium 129 (*)    Chloride 84 (*)    Glucose, Bld 309 (*)    BUN 32 (*)    Creatinine, Ser 1.51 (*)    GFR calc non Af Amer 33 (*)    GFR calc Af Amer 38 (*)    All other components within normal  limits  URINALYSIS, ROUTINE W REFLEX MICROSCOPIC (NOT AT [facility]) - Abnormal; Notable for the following:    Glucose, UA >1000 (*)    All other components within normal limits  BRAIN NATRIURETIC PEPTIDE  URINE MICROSCOPIC-ADD ON  I-STAT TROPOININ, ED  I-STAT TROPOININ, ED    Imaging Review Dg Chest 2 View  12/09/2014  CLINICAL DATA:  Two week history of shortness of breath and productive cough. Concern for worsening respiratory infection. History of pulmonary sarcoid. EXAM: CHEST  2 VIEW COMPARISON:  Radiograph 10/22/2014.  CT 12/2014 FINDINGS: Cardiomediastinal contours are unchanged. Interstitial reticular opacity is are unchanged in degree compared to prior exam. The small ground-glass regions on prior CT are not well seen. No confluent airspace disease. No pleural effusion. No findings of superimposed pulmonary edema. No pneumothorax. Unchanged appearance of the osseous structures. IMPRESSION: Unchanged appearance of the chest with increased interstitial markings, stable in degree from prior. No superimposed acute process. Electronically Signed   By: Jeb Levering M.D.   On: 12/09/2014 18:15   I have personally reviewed and evaluated these images and lab results as part of my medical decision-making.   EKG Interpretation   Date/Time:  Thursday December 09 2014 17:03:05 EDT Ventricular Rate:  100 PR Interval:  116 QRS Duration: 78 QT Interval:  334 QTC Calculation: 430 R Axis:   72 Text Interpretation:  Normal sinus rhythm Normal ECG ED PHYSICIAN  INTERPRETATION AVAILABLE IN [facility] Confirmed by TEST, Record  ([id number]) on 12/10/2014 7:22:57 AM      MDM   Final diagnoses:  COPD exacerbation (HCC)  Chronic respiratory failure with hypoxia ([place])    74 yo F with hx pulmonary sarcoidosis and fibrosis with chronic hypoxic respiratory failure on 3L [place] home and chronic steroids, COPD, HTN, DM, CHF, depression presenting with shortness of breath as above. Recent tx as outpatient  for PNA, completed meds 3 days ago and reports minimal improvement- was sent by PCP for admission for IV tx. AF, tachycardic but otherwise stable on  home 3L oxygen. Respiratory therapy given with improvement in breath sounds and tachycardia though still diminished air flow bilat. No new changes on CXR though pt with new leukocytosis. Pt also found to have new AKI, hyperglycemia, hyponatremia. BNP wnl and no evidence of new cardiac ischemia- feel presentation likely 2/2 COPD exacerbation with worsening/progression of chronic sarcoidosis/PF possible as well. Given 1 dose fluconazole for reported vaginitis after antibiotics, IVF, with steroids/abx per discussion with admitting team. Admitted for further management in stable condition.   Case discussed with Dr. Billy Fischer, who oversaw management of this patient.     Ivin Booty, MD 12/13/14 1342  Gareth Morgan, MD 12/14/14 1344

## 2014-12-09 NOTE — ED Notes (Signed)
Pt is coming from pcp for worsening respiratory infection. She has a pulmonary sarcoid and is on chronic oxygen 3 L. On 10/7 was given rocephin shot and has been taking prednisone. Concern for continued sob, productive cough.

## 2014-12-09 NOTE — ED Notes (Signed)
Pt st's she feels some better after breathing tx.  Family at bedside.

## 2014-12-09 NOTE — ED Notes (Signed)
Pt transported to xray 

## 2014-12-10 ENCOUNTER — Encounter (HOSPITAL_COMMUNITY): Payer: Self-pay | Admitting: *Deleted

## 2014-12-10 DIAGNOSIS — Z794 Long term (current) use of insulin: Secondary | ICD-10-CM | POA: Diagnosis not present

## 2014-12-10 DIAGNOSIS — I4891 Unspecified atrial fibrillation: Secondary | ICD-10-CM | POA: Diagnosis not present

## 2014-12-10 DIAGNOSIS — R0602 Shortness of breath: Secondary | ICD-10-CM | POA: Diagnosis not present

## 2014-12-10 DIAGNOSIS — R05 Cough: Secondary | ICD-10-CM | POA: Diagnosis not present

## 2014-12-10 DIAGNOSIS — J440 Chronic obstructive pulmonary disease with acute lower respiratory infection: Secondary | ICD-10-CM | POA: Diagnosis present

## 2014-12-10 DIAGNOSIS — J441 Chronic obstructive pulmonary disease with (acute) exacerbation: Secondary | ICD-10-CM | POA: Diagnosis not present

## 2014-12-10 DIAGNOSIS — E785 Hyperlipidemia, unspecified: Secondary | ICD-10-CM | POA: Diagnosis present

## 2014-12-10 DIAGNOSIS — T502X5A Adverse effect of carbonic-anhydrase inhibitors, benzothiadiazides and other diuretics, initial encounter: Secondary | ICD-10-CM | POA: Diagnosis present

## 2014-12-10 DIAGNOSIS — K219 Gastro-esophageal reflux disease without esophagitis: Secondary | ICD-10-CM | POA: Diagnosis present

## 2014-12-10 DIAGNOSIS — I350 Nonrheumatic aortic (valve) stenosis: Secondary | ICD-10-CM | POA: Diagnosis present

## 2014-12-10 DIAGNOSIS — Z833 Family history of diabetes mellitus: Secondary | ICD-10-CM | POA: Diagnosis not present

## 2014-12-10 DIAGNOSIS — Z66 Do not resuscitate: Secondary | ICD-10-CM | POA: Diagnosis present

## 2014-12-10 DIAGNOSIS — Z7982 Long term (current) use of aspirin: Secondary | ICD-10-CM | POA: Diagnosis not present

## 2014-12-10 DIAGNOSIS — N179 Acute kidney failure, unspecified: Secondary | ICD-10-CM | POA: Diagnosis not present

## 2014-12-10 DIAGNOSIS — N182 Chronic kidney disease, stage 2 (mild): Secondary | ICD-10-CM | POA: Diagnosis present

## 2014-12-10 DIAGNOSIS — I130 Hypertensive heart and chronic kidney disease with heart failure and stage 1 through stage 4 chronic kidney disease, or unspecified chronic kidney disease: Secondary | ICD-10-CM | POA: Diagnosis present

## 2014-12-10 DIAGNOSIS — I272 Other secondary pulmonary hypertension: Secondary | ICD-10-CM | POA: Diagnosis not present

## 2014-12-10 DIAGNOSIS — E1122 Type 2 diabetes mellitus with diabetic chronic kidney disease: Secondary | ICD-10-CM | POA: Diagnosis present

## 2014-12-10 DIAGNOSIS — M810 Age-related osteoporosis without current pathological fracture: Secondary | ICD-10-CM | POA: Diagnosis present

## 2014-12-10 DIAGNOSIS — Z8249 Family history of ischemic heart disease and other diseases of the circulatory system: Secondary | ICD-10-CM | POA: Diagnosis not present

## 2014-12-10 DIAGNOSIS — I5032 Chronic diastolic (congestive) heart failure: Secondary | ICD-10-CM | POA: Diagnosis not present

## 2014-12-10 DIAGNOSIS — J209 Acute bronchitis, unspecified: Secondary | ICD-10-CM | POA: Diagnosis not present

## 2014-12-10 DIAGNOSIS — Z823 Family history of stroke: Secondary | ICD-10-CM | POA: Diagnosis not present

## 2014-12-10 DIAGNOSIS — Z7952 Long term (current) use of systemic steroids: Secondary | ICD-10-CM | POA: Diagnosis not present

## 2014-12-10 DIAGNOSIS — Z825 Family history of asthma and other chronic lower respiratory diseases: Secondary | ICD-10-CM | POA: Diagnosis not present

## 2014-12-10 DIAGNOSIS — I10 Essential (primary) hypertension: Secondary | ICD-10-CM | POA: Diagnosis not present

## 2014-12-10 DIAGNOSIS — E871 Hypo-osmolality and hyponatremia: Secondary | ICD-10-CM | POA: Diagnosis present

## 2014-12-10 DIAGNOSIS — E860 Dehydration: Secondary | ICD-10-CM | POA: Diagnosis present

## 2014-12-10 DIAGNOSIS — I480 Paroxysmal atrial fibrillation: Secondary | ICD-10-CM | POA: Diagnosis not present

## 2014-12-10 DIAGNOSIS — Z87891 Personal history of nicotine dependence: Secondary | ICD-10-CM | POA: Diagnosis not present

## 2014-12-10 DIAGNOSIS — M1990 Unspecified osteoarthritis, unspecified site: Secondary | ICD-10-CM | POA: Diagnosis present

## 2014-12-10 DIAGNOSIS — Z801 Family history of malignant neoplasm of trachea, bronchus and lung: Secondary | ICD-10-CM | POA: Diagnosis not present

## 2014-12-10 DIAGNOSIS — J9621 Acute and chronic respiratory failure with hypoxia: Secondary | ICD-10-CM | POA: Diagnosis not present

## 2014-12-10 DIAGNOSIS — E876 Hypokalemia: Secondary | ICD-10-CM | POA: Diagnosis present

## 2014-12-10 DIAGNOSIS — T380X5A Adverse effect of glucocorticoids and synthetic analogues, initial encounter: Secondary | ICD-10-CM | POA: Diagnosis present

## 2014-12-10 DIAGNOSIS — E1165 Type 2 diabetes mellitus with hyperglycemia: Secondary | ICD-10-CM | POA: Diagnosis not present

## 2014-12-10 DIAGNOSIS — Z9981 Dependence on supplemental oxygen: Secondary | ICD-10-CM | POA: Diagnosis not present

## 2014-12-10 LAB — COMPREHENSIVE METABOLIC PANEL
ALBUMIN: 3.4 g/dL — AB (ref 3.5–5.0)
ALT: 33 U/L (ref 14–54)
AST: 26 U/L (ref 15–41)
Alkaline Phosphatase: 63 U/L (ref 38–126)
Anion gap: 12 (ref 5–15)
BILIRUBIN TOTAL: 0.5 mg/dL (ref 0.3–1.2)
BUN: 33 mg/dL — AB (ref 6–20)
CHLORIDE: 87 mmol/L — AB (ref 101–111)
CO2: 32 mmol/L (ref 22–32)
Calcium: 9.5 mg/dL (ref 8.9–10.3)
Creatinine, Ser: 1.39 mg/dL — ABNORMAL HIGH (ref 0.44–1.00)
GFR calc Af Amer: 42 mL/min — ABNORMAL LOW (ref >60)
GFR calc non Af Amer: 37 mL/min — ABNORMAL LOW (ref >60)
GLUCOSE: 274 mg/dL — AB (ref 65–99)
POTASSIUM: 4 mmol/L (ref 3.5–5.1)
Sodium: 131 mmol/L — ABNORMAL LOW (ref 135–145)
Total Protein: 6.6 g/dL (ref 6.5–8.1)

## 2014-12-10 LAB — CBC WITH DIFFERENTIAL/PLATELET
Basophils Absolute: 0.1 10*3/uL (ref 0.0–0.1)
Basophils Relative: 1 %
EOS PCT: 0 %
Eosinophils Absolute: 0 10*3/uL (ref 0.0–0.7)
HEMATOCRIT: 34 % — AB (ref 36.0–46.0)
Hemoglobin: 11.5 g/dL — ABNORMAL LOW (ref 12.0–15.0)
LYMPHS ABS: 3.2 10*3/uL (ref 0.7–4.0)
Lymphocytes Relative: 29 %
MCH: 32.5 pg (ref 26.0–34.0)
MCHC: 33.8 g/dL (ref 30.0–36.0)
MCV: 96 fL (ref 78.0–100.0)
MONOS PCT: 6 %
Monocytes Absolute: 0.7 10*3/uL (ref 0.1–1.0)
NEUTROS ABS: 7.1 10*3/uL (ref 1.7–7.7)
Neutrophils Relative %: 64 %
Platelets: 200 10*3/uL (ref 150–400)
RBC: 3.54 MIL/uL — ABNORMAL LOW (ref 3.87–5.11)
RDW: 13.4 % (ref 11.5–15.5)
WBC: 11.1 10*3/uL — ABNORMAL HIGH (ref 4.0–10.5)

## 2014-12-10 LAB — GLUCOSE, CAPILLARY
GLUCOSE-CAPILLARY: 348 mg/dL — AB (ref 65–99)
GLUCOSE-CAPILLARY: 467 mg/dL — AB (ref 65–99)
Glucose-Capillary: 273 mg/dL — ABNORMAL HIGH (ref 65–99)
Glucose-Capillary: 479 mg/dL — ABNORMAL HIGH (ref 65–99)
Glucose-Capillary: 497 mg/dL — ABNORMAL HIGH (ref 65–99)
Glucose-Capillary: 417 mg/dL — ABNORMAL HIGH (ref 65–99)
Glucose-Capillary: 413 mg/dL — ABNORMAL HIGH (ref 65–99)

## 2014-12-10 MED ORDER — INSULIN DETEMIR 100 UNIT/ML ~~LOC~~ SOLN
50.0000 [IU] | Freq: Every day | SUBCUTANEOUS | Status: DC
  Administered 2014-12-11 – 2014-12-16 (×6): 50 [IU] via SUBCUTANEOUS
  Filled 2014-12-10 (×9): qty 0.5

## 2014-12-10 MED ORDER — METHYLPREDNISOLONE SODIUM SUCC 125 MG IJ SOLR
80.0000 mg | Freq: Three times a day (TID) | INTRAMUSCULAR | Status: DC
Start: 2014-12-10 — End: 2014-12-11
  Administered 2014-12-10 – 2014-12-11 (×2): 80 mg via INTRAVENOUS
  Filled 2014-12-10 (×2): qty 2

## 2014-12-10 MED ORDER — INSULIN ASPART 100 UNIT/ML ~~LOC~~ SOLN
0.0000 [IU] | Freq: Three times a day (TID) | SUBCUTANEOUS | Status: DC
  Administered 2014-12-10: 20 [IU] via SUBCUTANEOUS
  Administered 2014-12-11: 11 [IU] via SUBCUTANEOUS
  Administered 2014-12-11: 15 [IU] via SUBCUTANEOUS
  Administered 2014-12-11: 20 [IU] via SUBCUTANEOUS
  Administered 2014-12-12 (×2): 15 [IU] via SUBCUTANEOUS
  Administered 2014-12-12: 20 [IU] via SUBCUTANEOUS
  Administered 2014-12-13: 15 [IU] via SUBCUTANEOUS
  Administered 2014-12-13: 20 [IU] via SUBCUTANEOUS
  Administered 2014-12-13: 7 [IU] via SUBCUTANEOUS
  Administered 2014-12-14: 11 [IU] via SUBCUTANEOUS
  Administered 2014-12-14: 20 [IU] via SUBCUTANEOUS
  Administered 2014-12-14: 7 [IU] via SUBCUTANEOUS
  Administered 2014-12-15 (×2): 11 [IU] via SUBCUTANEOUS
  Administered 2014-12-15: 7 [IU] via SUBCUTANEOUS
  Administered 2014-12-16: 11 [IU] via SUBCUTANEOUS
  Administered 2014-12-16 (×2): 7 [IU] via SUBCUTANEOUS

## 2014-12-10 MED ORDER — ROPINIROLE HCL 1 MG PO TABS
3.0000 mg | ORAL_TABLET | Freq: Two times a day (BID) | ORAL | Status: DC
  Administered 2014-12-10 – 2014-12-16 (×13): 3 mg via ORAL
  Filled 2014-12-10 (×17): qty 3

## 2014-12-10 MED ORDER — INSULIN DETEMIR 100 UNIT/ML ~~LOC~~ SOLN
10.0000 [IU] | Freq: Once | SUBCUTANEOUS | Status: AC
  Administered 2014-12-10: 10 [IU] via SUBCUTANEOUS
  Filled 2014-12-10: qty 0.1

## 2014-12-10 MED ORDER — INSULIN ASPART 100 UNIT/ML ~~LOC~~ SOLN
10.0000 [IU] | Freq: Once | SUBCUTANEOUS | Status: AC
  Administered 2014-12-10: 10 [IU] via SUBCUTANEOUS

## 2014-12-10 MED ORDER — SODIUM CHLORIDE 0.9 % IV SOLN
INTRAVENOUS | Status: AC
  Administered 2014-12-10: 03:00:00 via INTRAVENOUS

## 2014-12-10 MED ORDER — GABAPENTIN 300 MG PO CAPS
300.0000 mg | ORAL_CAPSULE | Freq: Every day | ORAL | Status: DC
  Administered 2014-12-10 – 2014-12-15 (×7): 300 mg via ORAL
  Filled 2014-12-10 (×7): qty 1

## 2014-12-10 MED ORDER — COLESEVELAM HCL 3.75 G PO PACK: 1.0000 | PACK | Freq: Every evening | ORAL | Status: DC

## 2014-12-10 MED ORDER — ENOXAPARIN SODIUM 40 MG/0.4ML ~~LOC~~ SOLN
40.0000 mg | SUBCUTANEOUS | Status: DC
  Administered 2014-12-10 – 2014-12-13 (×4): 40 mg via SUBCUTANEOUS
  Filled 2014-12-10 (×5): qty 0.4

## 2014-12-10 MED ORDER — INSULIN LISPRO 100 UNIT/ML (KWIKPEN): 11.0000 [IU] | PEN_INJECTOR | Freq: Three times a day (TID) | SUBCUTANEOUS | Status: DC

## 2014-12-10 MED ORDER — ONDANSETRON HCL 4 MG PO TABS: 4.0000 mg | ORAL_TABLET | Freq: Four times a day (QID) | ORAL | Status: DC | PRN

## 2014-12-10 MED ORDER — INSULIN ASPART 100 UNIT/ML ~~LOC~~ SOLN: 11.0000 [IU] | Freq: Three times a day (TID) | SUBCUTANEOUS | Status: DC

## 2014-12-10 MED ORDER — SODIUM CHLORIDE 0.9 % IJ SOLN
3.0000 mL | Freq: Two times a day (BID) | INTRAMUSCULAR | Status: DC
  Administered 2014-12-10 – 2014-12-16 (×10): 3 mL via INTRAVENOUS

## 2014-12-10 MED ORDER — SODIUM CHLORIDE 0.9 % IV SOLN: 250.0000 mL | INTRAVENOUS | Status: DC | PRN

## 2014-12-10 MED ORDER — MAGNESIUM SULFATE 2 GM/50ML IV SOLN
2.0000 g | Freq: Once | INTRAVENOUS | Status: AC
  Administered 2014-12-10: 2 g via INTRAVENOUS
  Filled 2014-12-10: qty 50

## 2014-12-10 MED ORDER — INSULIN DETEMIR 100 UNIT/ML ~~LOC~~ SOLN
50.0000 [IU] | Freq: Every day | SUBCUTANEOUS | Status: DC
  Filled 2014-12-10: qty 0.5

## 2014-12-10 MED ORDER — ASPIRIN 81 MG PO CHEW
81.0000 mg | CHEWABLE_TABLET | Freq: Every day | ORAL | Status: DC
  Administered 2014-12-10 – 2014-12-16 (×7): 81 mg via ORAL
  Filled 2014-12-10 (×7): qty 1

## 2014-12-10 MED ORDER — POTASSIUM CHLORIDE CRYS ER 20 MEQ PO TBCR
20.0000 meq | EXTENDED_RELEASE_TABLET | Freq: Every day | ORAL | Status: DC
  Administered 2014-12-10 – 2014-12-14 (×5): 20 meq via ORAL
  Filled 2014-12-10 (×5): qty 1

## 2014-12-10 MED ORDER — ERGOCALCIFEROL 1.25 MG (50000 UT) PO CAPS: 50000.0000 [IU] | ORAL_CAPSULE | ORAL | Status: DC

## 2014-12-10 MED ORDER — SODIUM CHLORIDE 0.9 % IJ SOLN
3.0000 mL | Freq: Two times a day (BID) | INTRAMUSCULAR | Status: DC
  Administered 2014-12-10 – 2014-12-16 (×8): 3 mL via INTRAVENOUS

## 2014-12-10 MED ORDER — INSULIN DETEMIR 100 UNIT/ML FLEXPEN: 40.0000 [IU] | PEN_INJECTOR | Freq: Every day | SUBCUTANEOUS | Status: DC

## 2014-12-10 MED ORDER — FLUTICASONE PROPIONATE 50 MCG/ACT NA SUSP
2.0000 | Freq: Every day | NASAL | Status: DC
  Administered 2014-12-10 – 2014-12-16 (×7): 2 via NASAL
  Filled 2014-12-10: qty 16

## 2014-12-10 MED ORDER — ACETAMINOPHEN 325 MG PO TABS
650.0000 mg | ORAL_TABLET | Freq: Four times a day (QID) | ORAL | Status: DC | PRN
  Administered 2014-12-11 – 2014-12-15 (×2): 650 mg via ORAL
  Filled 2014-12-10 (×2): qty 2

## 2014-12-10 MED ORDER — INSULIN ASPART 100 UNIT/ML ~~LOC~~ SOLN
8.0000 [IU] | Freq: Three times a day (TID) | SUBCUTANEOUS | Status: DC
  Administered 2014-12-10: 8 [IU] via SUBCUTANEOUS

## 2014-12-10 MED ORDER — ALBUTEROL SULFATE (2.5 MG/3ML) 0.083% IN NEBU: 1.2500 mg | INHALATION_SOLUTION | RESPIRATORY_TRACT | Status: DC | PRN

## 2014-12-10 MED ORDER — IPRATROPIUM-ALBUTEROL 0.5-2.5 (3) MG/3ML IN SOLN
3.0000 mL | Freq: Four times a day (QID) | RESPIRATORY_TRACT | Status: DC
  Administered 2014-12-10 (×4): 3 mL via RESPIRATORY_TRACT
  Filled 2014-12-10 (×4): qty 3

## 2014-12-10 MED ORDER — BENZONATATE 100 MG PO CAPS
100.0000 mg | ORAL_CAPSULE | ORAL | Status: DC | PRN
  Administered 2014-12-13 – 2014-12-14 (×2): 100 mg via ORAL
  Filled 2014-12-10 (×2): qty 1

## 2014-12-10 MED ORDER — INSULIN ASPART 100 UNIT/ML ~~LOC~~ SOLN
0.0000 [IU] | Freq: Every day | SUBCUTANEOUS | Status: DC
  Administered 2014-12-11: 4 [IU] via SUBCUTANEOUS
  Administered 2014-12-12: 5 [IU] via SUBCUTANEOUS
  Administered 2014-12-14: 4 [IU] via SUBCUTANEOUS
  Administered 2014-12-15: 2 [IU] via SUBCUTANEOUS

## 2014-12-10 MED ORDER — VITAMIN D (ERGOCALCIFEROL) 1.25 MG (50000 UNIT) PO CAPS
50000.0000 [IU] | ORAL_CAPSULE | ORAL | Status: DC
  Administered 2014-12-10: 50000 [IU] via ORAL
  Filled 2014-12-10: qty 1

## 2014-12-10 MED ORDER — LEVOFLOXACIN IN D5W 750 MG/150ML IV SOLN: 750.0000 mg | INTRAVENOUS | Status: DC

## 2014-12-10 MED ORDER — GUAIFENESIN ER 600 MG PO TB12
600.0000 mg | ORAL_TABLET | Freq: Two times a day (BID) | ORAL | Status: DC
  Administered 2014-12-10 – 2014-12-16 (×14): 600 mg via ORAL
  Filled 2014-12-10 (×14): qty 1

## 2014-12-10 MED ORDER — METHYLPREDNISOLONE SODIUM SUCC 125 MG IJ SOLR
125.0000 mg | Freq: Three times a day (TID) | INTRAMUSCULAR | Status: DC
  Administered 2014-12-10 (×2): 125 mg via INTRAVENOUS
  Filled 2014-12-10 (×2): qty 2

## 2014-12-10 MED ORDER — TRAMADOL HCL 50 MG PO TABS: 50.0000 mg | ORAL_TABLET | Freq: Two times a day (BID) | ORAL | Status: DC

## 2014-12-10 MED ORDER — SPIRONOLACTONE 25 MG PO TABS
25.0000 mg | ORAL_TABLET | Freq: Two times a day (BID) | ORAL | Status: DC
  Administered 2014-12-10 (×2): 25 mg via ORAL
  Filled 2014-12-10 (×2): qty 1

## 2014-12-10 MED ORDER — ROPINIROLE HCL 1 MG PO TABS
3.0000 mg | ORAL_TABLET | Freq: Once | ORAL | Status: AC
  Administered 2014-12-10: 3 mg via ORAL
  Filled 2014-12-10: qty 3

## 2014-12-10 MED ORDER — TORSEMIDE 20 MG PO TABS
50.0000 mg | ORAL_TABLET | Freq: Two times a day (BID) | ORAL | Status: DC
  Administered 2014-12-11 – 2014-12-14 (×7): 50 mg via ORAL
  Filled 2014-12-10 (×9): qty 3

## 2014-12-10 MED ORDER — ALBUTEROL SULFATE (2.5 MG/3ML) 0.083% IN NEBU: 2.5000 mg | INHALATION_SOLUTION | Freq: Four times a day (QID) | RESPIRATORY_TRACT | Status: DC

## 2014-12-10 MED ORDER — IPRATROPIUM BROMIDE 0.02 % IN SOLN: 0.5000 mg | Freq: Four times a day (QID) | RESPIRATORY_TRACT | Status: DC

## 2014-12-10 MED ORDER — TRAMADOL HCL 50 MG PO TABS
100.0000 mg | ORAL_TABLET | Freq: Every day | ORAL | Status: DC
  Administered 2014-12-10 – 2014-12-15 (×7): 100 mg via ORAL
  Filled 2014-12-10 (×7): qty 2

## 2014-12-10 MED ORDER — METHYLPREDNISOLONE SODIUM SUCC 125 MG IJ SOLR
80.0000 mg | Freq: Four times a day (QID) | INTRAMUSCULAR | Status: DC
  Administered 2014-12-10: 80 mg via INTRAVENOUS
  Filled 2014-12-10: qty 2

## 2014-12-10 MED ORDER — INSULIN ASPART 100 UNIT/ML ~~LOC~~ SOLN
20.0000 [IU] | Freq: Once | SUBCUTANEOUS | Status: AC
Start: 2014-12-10 — End: 2014-12-10
  Administered 2014-12-10: 20 [IU] via SUBCUTANEOUS

## 2014-12-10 MED ORDER — ONDANSETRON HCL 4 MG/2ML IJ SOLN: 4.0000 mg | Freq: Four times a day (QID) | INTRAMUSCULAR | Status: DC | PRN

## 2014-12-10 MED ORDER — CITALOPRAM HYDROBROMIDE 20 MG PO TABS
40.0000 mg | ORAL_TABLET | Freq: Every day | ORAL | Status: DC
  Administered 2014-12-10 – 2014-12-15 (×6): 40 mg via ORAL
  Filled 2014-12-10 (×6): qty 2

## 2014-12-10 MED ORDER — INSULIN ASPART 100 UNIT/ML ~~LOC~~ SOLN
0.0000 [IU] | Freq: Three times a day (TID) | SUBCUTANEOUS | Status: DC
  Administered 2014-12-10: 11 [IU] via SUBCUTANEOUS

## 2014-12-10 MED ORDER — COLESEVELAM HCL 625 MG PO TABS
625.0000 mg | ORAL_TABLET | Freq: Every day | ORAL | Status: DC
  Administered 2014-12-10 – 2014-12-15 (×6): 625 mg via ORAL
  Filled 2014-12-10 (×7): qty 1

## 2014-12-10 MED ORDER — LORATADINE 10 MG PO TABS: 10.0000 mg | ORAL_TABLET | Freq: Every day | ORAL | Status: DC | PRN

## 2014-12-10 MED ORDER — INSULIN DETEMIR 100 UNIT/ML ~~LOC~~ SOLN
40.0000 [IU] | Freq: Every day | SUBCUTANEOUS | Status: DC
  Administered 2014-12-10: 40 [IU] via SUBCUTANEOUS
  Filled 2014-12-10 (×2): qty 0.4

## 2014-12-10 MED ORDER — INSULIN ASPART 100 UNIT/ML ~~LOC~~ SOLN
8.0000 [IU] | Freq: Three times a day (TID) | SUBCUTANEOUS | Status: DC
  Administered 2014-12-11 – 2014-12-16 (×17): 8 [IU] via SUBCUTANEOUS

## 2014-12-10 MED ORDER — LEVOFLOXACIN IN D5W 750 MG/150ML IV SOLN
750.0000 mg | INTRAVENOUS | Status: DC
  Administered 2014-12-10 – 2014-12-12 (×2): 750 mg via INTRAVENOUS
  Filled 2014-12-10 (×2): qty 150

## 2014-12-10 MED ORDER — PANTOPRAZOLE SODIUM 40 MG PO TBEC
40.0000 mg | DELAYED_RELEASE_TABLET | Freq: Every day | ORAL | Status: DC
  Administered 2014-12-10 – 2014-12-16 (×7): 40 mg via ORAL
  Filled 2014-12-10 (×8): qty 1

## 2014-12-10 MED ORDER — TRAMADOL HCL 50 MG PO TABS
50.0000 mg | ORAL_TABLET | Freq: Every day | ORAL | Status: DC
  Administered 2014-12-10 – 2014-12-16 (×7): 50 mg via ORAL
  Filled 2014-12-10 (×7): qty 1

## 2014-12-10 MED ORDER — DIVALPROEX SODIUM ER 500 MG PO TB24
1000.0000 mg | ORAL_TABLET | Freq: Every day | ORAL | Status: DC
  Administered 2014-12-10 – 2014-12-15 (×7): 1000 mg via ORAL
  Filled 2014-12-10 (×7): qty 2

## 2014-12-10 MED ORDER — SODIUM CHLORIDE 0.9 % IJ SOLN: 3.0000 mL | INTRAMUSCULAR | Status: DC | PRN

## 2014-12-10 NOTE — Progress Notes (Signed)
Patient cbg 497, notified MD. Order given to give one time order of 20 Units of Novolog. Carried out orders and rechecked CBG 30 minutes later, CBG 479. Md paged. Will continue to monitor.

## 2014-12-10 NOTE — Progress Notes (Signed)
Nutrition Brief Note  Patient identified on the Malnutrition Screening Tool (MST) Report  Wt Readings from Last 15 Encounters:  12/10/14 192 lb 1.6 oz (87.136 kg)  11/04/14 200 lb (90.719 kg)  10/24/14 204 lb (92.534 kg)  09/16/14 198 lb (89.812 kg)  09/08/14 207 lb 4.8 oz (94.031 kg)  08/06/14 201 lb (91.173 kg)  07/05/14 205 lb (92.987 kg)  07/04/14 207 lb 2 oz (93.951 kg)  02/04/14 211 lb (95.709 kg)  01/07/14 205 lb (92.987 kg)  11/26/13 208 lb 9.6 oz (94.62 kg)  08/05/13 207 lb (93.895 kg)  07/09/13 206 lb (93.441 kg)  02/26/13 194 lb 9.6 oz (88.27 kg)  11/12/12 199 lb (90.266 kg)    Body mass index is 31.97 kg/(m^2). Patient meets criteria for Obesity based on current BMI. Pt on phone at time of first visit, asked RD to return but, she was working with PT upon second visit. Per weight history patient has lost 7 lbs in the past 3 months-not significant.   Current diet order is Heart healthy/Carb Modified, patient is consuming approximately 100% of meals at this time. Labs and medications reviewed.   No nutrition interventions warranted at this time. If nutrition issues arise, please consult RD.   Scarlette Ar RD, LDN Inpatient Clinical Dietitian Pager: [phone] After Hours Pager: [phone]

## 2014-12-10 NOTE — H&P (Signed)
[facility] History and Physical  Penny Curry NWG:[id number] DOB: November 04, 1940 DOA: 12/09/2014  Referring physician: Ivin Booty, MD PCP: Ronita Hipps, MD   Chief Complaint: Shortness of breath.  HPI: Penny Curry is a 74 y.o. 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 The patient states that about 2 weeks ago she developed cold-like symptoms with rhinorrhea, nasal congestion, sore throat and cough. She went to see her primary care doctor on October 7 who gave her a Rocephin injection and started her on oral clindamycin. However, her symptoms continued getting worse and today her PCP suggested for her to come to the emergency department.  In the ER, the patient has received supplemental oxygen, bronchodilators sustaining partial relief. She is in no acute distress.   Review of Systems:  Constitutional:  Positive low-grade Fevers, chills, fatigue. No weight loss, night sweats,   HEENT:  Positive nasal congestion, post nasal drip, Sore throat,  No headaches, Difficulty swallowing,Tooth/dental problems, No sneezing, itching, ear ache,  Cardio-vascular:  Positive occasional swelling in lower extremities, No chest pain, Orthopnea, PND,  anasarca, dizziness, palpitations  GI:  No heartburn, indigestion, abdominal pain, nausea, vomiting, diarrhea, change in bowel habits, loss of appetite  Resp:  Positive dyspnea, productive cough of yellowish-greenish sputum with occasional pleuritic chest pain, wheezing. No hemoptysis Skin:  no rash or lesions.  GU:  no dysuria, change in color of urine, no urgency or frequency. No flank  pain.  Musculoskeletal:  No joint pain or swelling. No decreased range of motion. No back pain.  Psych:  No change in mood or affect. No depression or anxiety. No memory loss.   Past Medical History  Diagnosis Date  . HTN (hypertension)   . Diabetes mellitus   . Fibrosis of lung ([place])   . CHF (congestive heart failure) ([place])   . GERD (gastroesophageal reflux disease)   . Chronic headache     migraines  . UTI (urinary tract infection)     " MULTIPLE TIMES THIS PAST YEAR "  . Arthritis   . Anemia   . Cataract   . COPD (chronic obstructive pulmonary disease) ([place])   . Depression   . Osteoporosis   . Hyperlipidemia    Past Surgical History  Procedure Laterality Date  . Neck surgery  1987  . Partial hysterectomy    . Tubal ligation    . Cataract extraction    . Abdominal hysterectomy     Social History:  reports that she quit smoking about 46 years ago. Her smoking use included Cigarettes. She has a 8 pack-year smoking history. She has never used smokeless tobacco. She reports that she does not drink alcohol or use illicit drugs.  Allergies  Allergen Reactions  . Clindamycin/Lincomycin Swelling and Rash  . Penicillins Swelling and Rash    Has patient had a PCN reaction causing immediate rash, facial/tongue/throat swelling, SOB or lightheadedness with hypotension:  Has patient had a PCN reaction causing severe rash involving mucus membranes or skin necrosis:  Has patient had a PCN reaction that required hospitalization Has patient had a PCN reaction occurring within the last 10 years:  If all of the above answers are "NO", then may proceed  with Cephalosporin use.  . Tape Itching and Rash    Paper tape is ok.    Family History  Problem Relation Age of Onset  . Prostate cancer Brother   . Heart disease Brother     MI at age 27  . Stroke Mother     stroke at age 27  . Diabetes Mother   . Asthma Son   . Emphysema Father   . Heart disease Father   . Lung cancer Sister    . Heart disease Sister     MI at 36  . Heart disease Brother     all three brothers have had MI    Prior to Admission medications   Medication Sig Start Date End Date Taking? Authorizing Provider  albuterol (PROVENTIL) (2.5 MG/3ML) 0.083% nebulizer solution Take 3 mLs (2.5 mg total) by nebulization 3 (three) times daily. 09/14/14  Yes Collene Gobble, MD  aspirin 81 MG chewable tablet Chew 1 tablet (81 mg total) by mouth daily. 09/07/14  Yes Lucious Groves, DO  Benzonatate (TESSALON PO) Take 1 capsule by mouth daily as needed (cough).   Yes Historical Provider, MD  citalopram (CELEXA) 40 MG tablet Take 40 mg by mouth at bedtime.    Yes Historical Provider, MD  Colesevelam HCl 3.75 G PACK Take 1 packet by mouth every evening.   Yes Historical Provider, MD  divalproex (DEPAKOTE ER) 500 MG 24 hr tablet Take 1,000 mg by mouth at bedtime.   Yes Historical Provider, MD  ergocalciferol (VITAMIN D2) 50000 UNITS capsule Take 50,000 Units by mouth every Friday.   Yes Historical Provider, MD  fluticasone (FLONASE) 50 MCG/ACT nasal spray Use 2 sprays in each  nostril daily Patient taking differently: Use 2 sprays in each  nostril twice daily 11/22/14  Yes Collene Gobble, MD  gabapentin (NEURONTIN) 300 MG capsule Take 300 mg by mouth at bedtime.   Yes Historical Provider, MD  HUMALOG KWIKPEN 100 UNIT/ML KiwkPen Inject 11 Units into the skin 3 (three) times daily.  08/09/14  Yes Historical Provider, MD  LEVEMIR FLEXTOUCH 100 UNIT/ML Pen Inject 40 Units into the skin daily with breakfast.  08/09/14  Yes Historical Provider, MD  loratadine (CLARITIN) 10 MG tablet Take 1 tablet (10 mg total) by mouth daily. Patient taking differently: Take 10 mg by mouth daily as needed for allergies or rhinitis.  02/05/14  Yes Collene Gobble, MD  metolazone (ZAROXOLYN) 2.5 MG tablet Take 1 tablet (2.5 mg total) by mouth as needed (for weight 203 or greater). Patient taking differently: Take 2.5 mg by mouth as needed (for weight  195 or greater).  11/04/14  Yes Shaune Pascal Bensimhon, MD  ondansetron (ZOFRAN-ODT) 8 MG disintegrating tablet Take 8 mg by mouth every 6 (six) hours as needed for nausea or vomiting.  11/26/14  Yes Historical Provider, MD  OXYGEN Inhale 3 L into the lungs continuous.   Yes Historical Provider, MD  pantoprazole (PROTONIX) 40 MG tablet Take 40 mg by mouth daily.    Yes Historical Provider, MD  potassium chloride (KLOR-CON) 10 MEQ CR tablet Take 20 mEq by mouth daily before breakfast.    Yes Historical Provider, MD  predniSONE (DELTASONE) 20 MG tablet Take 1 tablet (20 mg total) by mouth daily with breakfast. 10/24/14  Yes Juluis Mire, MD  PROAIR HFA 108 (90 BASE) MCG/ACT inhaler Use 2 puffs every 6 hours  as needed for wheezing or  shortness of breath 08/09/14  Yes Collene Gobble,  MD  rOPINIRole (REQUIP) 3 MG tablet Take 3 mg by mouth 2 (two) times daily.     Yes Historical Provider, MD  spironolactone (ALDACTONE) 25 MG tablet Take 25 mg by mouth 2 (two) times daily.  11/12/13  Yes Historical Provider, MD  STIOLTO RESPIMAT 2.5-2.5 MCG/ACT AERS Inhale 2 puffs into the  lungs daily 11/22/14  Yes Collene Gobble, MD  tetrahydrozoline-zinc (VISINE-AC) 0.05-0.25 % ophthalmic solution Place 1 drop into both eyes daily as needed (dry eyes).   Yes Historical Provider, MD  torsemide (DEMADEX) 100 MG tablet Take 50 mg by mouth 2 (two) times daily. 10/04/14  Yes Historical Provider, MD  traMADol (ULTRAM) 50 MG tablet Take 50-100 mg by mouth 2 (two) times daily. Takes 1 tablet in the morning and 2 tablets at night 05/23/11  Yes Tanda Rockers, MD   Physical Exam: Filed Vitals:   12/09/14 2330 12/09/14 2345 12/10/14 0000 12/10/14 0015  BP: 131/49 131/65 101/57 106/46  Pulse: 91 99 85 89  Temp:      TempSrc:      Resp: 18 20 16 18   SpO2: 98% 99% 99% 98%    Wt Readings from Last 3 Encounters:  11/04/14 90.719 kg (200 lb)  10/24/14 92.534 kg (204 lb)  09/16/14 89.812 kg (198 lb)    General:  Appears calm and  comfortable Eyes: PERRL, normal lids, irises & conjunctiva ENT: grossly normal hearing, lips & tongue are mildly dry Neck: no LAD, masses or thyromegaly Cardiovascular: RRR, no m/r/g. No LE edema. Telemetry: SR, no arrhythmias  Respiratory: Decreased breath sounds bilaterally. Bilateral wheezing. Abdomen: soft, ntnd Skin: no rash or induration seen on limited exam Musculoskeletal: grossly normal tone BUE/BLE Psychiatric: grossly normal mood and affect, speech fluent and appropriate Neurologic: grossly non-focal.          Labs on Admission:  Basic Metabolic Panel:  Recent Labs Lab 12/09/14 1740  NA 129*  K 4.4  CL 84*  CO2 32  GLUCOSE 309*  BUN 32*  CREATININE 1.51*  CALCIUM 9.8   CBC:  Recent Labs Lab 12/09/14 1740  WBC 12.6*  NEUTROABS 9.9*  HGB 12.2  HCT 36.2  MCV 96.0  PLT 232    BNP (last 3 results)  Recent Labs  09/06/14 2150 10/22/14 1200 12/09/14 1930  BNP 127.2* 107.8* 93.1    Radiological Exams on Admission: Dg Chest 2 View  12/09/2014  CLINICAL DATA:  Two week history of shortness of breath and productive cough. Concern for worsening respiratory infection. History of pulmonary sarcoid. EXAM: CHEST  2 VIEW COMPARISON:  Radiograph 10/22/2014.  CT 12/2014 FINDINGS: Cardiomediastinal contours are unchanged. Interstitial reticular opacity is are unchanged in degree compared to prior exam. The small ground-glass regions on prior CT are not well seen. No confluent airspace disease. No pleural effusion. No findings of superimposed pulmonary edema. No pneumothorax. Unchanged appearance of the osseous structures. IMPRESSION: Unchanged appearance of the chest with increased interstitial markings, stable in degree from prior. No superimposed acute process. Electronically Signed   By: Jeb Levering M.D.   On: 12/09/2014 18:15    Echocardiogram: 11/12/2014.  LV EF: 55% -   60%  ------------------------------------------------------------------- Indications:   Pulmonary hypertension (I27.0).  ------------------------------------------------------------------- History:  PMH:  Congestive heart failure. Risk factors: Cor pulmonale. Interstitial lung disease. Asthma. Acute-on-chronic respiratory failure. Dysphagia. Encephalopathy. CKD stage 2. Sarcoidosis. Dehydration. Anemia. Cellulitis. Current tobacco use. Hypertension. Diabetes mellitus.  ------------------------------------------------------------------- Study Conclusions  - Left ventricle: The cavity size was normal.  Wall thickness was increased in a pattern of mild LVH. Systolic function was normal. The estimated ejection fraction was in the range of 55% to 60%. Wall motion was normal; there were no regional wall motion abnormalities. Doppler parameters are consistent with abnormal left ventricular relaxation (grade 1 diastolic dysfunction). - Aortic valve: There was mild stenosis. There was mild regurgitation. - Mitral valve: Calcified annulus. Mildly thickened leaflets . - Left atrium: The atrium was mildly dilated. - Pulmonic valve: The findings are consistent with mild stenosis.  Impressions:  - Normal LV systolic function; grade 1 diastolic dysfunction; mild LAE; mild AS with mean gradient 14 mmHg and mild AI; mild PS; trace MR and TR.     EKG: Independently reviewed. Vent. rate 100 BPM PR interval 116 ms QRS duration 78 ms QT/QTc 334/430 ms P-R-T axes 71 72 6 Normal sinus rhythm Normal ECG  Assessment/Plan Principal Problem:   COPD exacerbation (HCC)/Acute bronchitis Admit to telemetry. Continue bronchodilators and supplemental oxygen. Magnesium sulfate 2 g IV piggyback. Switch prednisone to Solu-Medrol. Levaquin 750 mg IV piggyback every 24 hours.  Active Problems:   Diabetes mellitus type 2, controlled ([place]) Continue long-acting insulin. CBG  monitoring with regular insulin sliding scale while in hospital. Taper off Solu-Medrol and resume maintenance prednisone as her symptoms improved.    Essential hypertension Hold diuretics therapy for a day. Monitor blood pressure.    Pulmonary hypertension ([place]) The patient has mild pulmonic valve stenosis. Resume diuretics once the patient is feeling better and the dehydration resolved.    Dehydration with hyponatremia  Corrected sodium is 134 mmol/L Hold diuretics for a day and rehydrate gently. Follow-up sodium level.      Code Status: Full code. DVT Prophylaxis: Lovenox SQ. Family Communication:  Disposition Plan: Admit for COPD exacerbation/acute bronchitis treatment.  Time spent: Over 70 minutes were spent during the process of this admission.   Reubin Milan [facility] Pager [phone].

## 2014-12-10 NOTE — Progress Notes (Signed)
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 Patient was noted to have acute COPD exacerbation. She was hospitalized for further management.  Past medical history:  Past Medical History  Diagnosis Date  . HTN (hypertension)   . Diabetes mellitus   . Fibrosis of lung ([place])   . CHF (congestive heart failure) ([place])   . GERD (gastroesophageal reflux disease)   . Chronic headache     migraines  . UTI (urinary tract infection)     " MULTIPLE TIMES THIS PAST YEAR "  . Arthritis   . Anemia   . Cataract   . COPD (chronic obstructive pulmonary disease) ([place])   . Depression   . Osteoporosis   . Hyperlipidemia     Consultants: None  Procedures: None  Antibiotics: Levaquin  Subjective: Patient feels slightly better this morning. Cough is improved. Breathing is improved. Denies any chest pain. No nausea or vomiting.  Objective: Vital Signs  Filed Vitals:   12/10/14 0210 12/10/14 0524 12/10/14 0757 12/10/14 0808  BP: 148/66 143/65  118/43  Pulse: 98 93  94  Temp: 98.5 F (36.9 C) 97.7 F (36.5 C)  97.2 F (36.2 C)  TempSrc: Oral Oral  Oral  Resp: 20 20  18   Height: 5\' 5"  (1.651 m)     Weight: 87.136 kg (192 lb 1.6 oz)     SpO2: 95% 100% 97% 97%    Intake/Output Summary (Last 24 hours) at 12/10/14 1006 Last data filed at 12/10/14 0858  Gross per 24 hour  Intake    995 ml  Output   1000 ml  Net     -5 ml   Filed Weights   12/10/14 0210  Weight: 87.136 kg (192 lb 1.6 oz)    General appearance: alert, cooperative, appears stated age, no distress and moderately obese Resp: Diffuse wheezing heard bilaterally. No definite crackles. No  rhonchi. Cardio: regular rate and rhythm, S1, S2 normal, no murmur, click, rub or gallop GI: soft, non-tender; bowel sounds normal; no masses,  no organomegaly Extremities: extremities normal, atraumatic, no cyanosis or edema Neurologic: Alert and oriented 3. No focal neurological deficits are noted.  Lab Results:  Basic Metabolic Panel:  Recent Labs Lab 12/09/14 1740 12/10/14 0240  NA 129* 131*  K 4.4 4.0  CL 84* 87*  CO2 32 32  GLUCOSE 309* 274*  BUN 32* 33*  CREATININE 1.51* 1.39*  CALCIUM 9.8 9.5   Liver Function Tests:  Recent Labs Lab 12/10/14 0240  AST 26  ALT 33  ALKPHOS 63  BILITOT 0.5  PROT 6.6  ALBUMIN 3.4*   CBC:  Recent Labs Lab 12/09/14 1740 12/10/14 0240  WBC 12.6* 11.1*  NEUTROABS 9.9* 7.1  HGB 12.2 11.5*  HCT 36.2 34.0*  MCV 96.0 96.0  PLT 232 200   BNP (last 3 results)  Recent Labs  09/06/14 2150 10/22/14 1200 12/09/14 1930  BNP 127.2* 107.8* 93.1     CBG:  Recent Labs Lab 12/10/14 0214 12/10/14 0606  GLUCAP 273* 348*    No results found for this or any previous visit (from the past 240 hour(s)).    Studies/Results: Dg Chest 2 View  12/09/2014  CLINICAL DATA:  Two week history of shortness  of breath and productive cough. Concern for worsening respiratory infection. History of pulmonary sarcoid. EXAM: CHEST  2 VIEW COMPARISON:  Radiograph 10/22/2014.  CT 12/2014 FINDINGS: Cardiomediastinal contours are unchanged. Interstitial reticular opacity is are unchanged in degree compared to prior exam. The small ground-glass regions on prior CT are not well seen. No confluent airspace disease. No pleural effusion. No findings of superimposed pulmonary edema. No pneumothorax. Unchanged appearance of the osseous structures. IMPRESSION: Unchanged appearance of the chest with increased interstitial markings, stable in degree from prior. No superimposed acute process. Electronically Signed   By: Jeb Levering M.D.   On: 12/09/2014 18:15     Medications:  Scheduled: . aspirin  81 mg Oral Daily  . citalopram  40 mg Oral QHS  . colesevelam  625 mg Oral Q supper  . divalproex  1,000 mg Oral QHS  . enoxaparin (LOVENOX) injection  40 mg Subcutaneous Q24H  . fluticasone  2 spray Each Nare Daily  . gabapentin  300 mg Oral QHS  . guaiFENesin  600 mg Oral BID  . insulin aspart  0-20 Units Subcutaneous TID WC  . insulin aspart  0-5 Units Subcutaneous QHS  . insulin detemir  40 Units Subcutaneous Q breakfast  . ipratropium-albuterol  3 mL Nebulization Q6H  . levofloxacin (LEVAQUIN) IV  750 mg Intravenous Q48H  . methylPREDNISolone (SOLU-MEDROL) injection  80 mg Intravenous 4 times per day  . pantoprazole  40 mg Oral Daily  . potassium chloride  20 mEq Oral Daily  . rOPINIRole  3 mg Oral BID  . sodium chloride  3 mL Intravenous Q12H  . sodium chloride  3 mL Intravenous Q12H  . [START ON 12/11/2014] torsemide  50 mg Oral BID  . traMADol  100 mg Oral QHS  . traMADol  50 mg Oral Daily  . Vitamin D (Ergocalciferol)  50,000 Units Oral Q Fri   Continuous:  DJS:HFWYOV chloride, acetaminophen, albuterol, benzonatate, loratadine, ondansetron **OR** ondansetron (ZOFRAN) IV, sodium chloride  Assessment/Plan:  Principal Problem:   COPD exacerbation (HCC) Active Problems:   Diabetes mellitus type 2, controlled ([place])   Essential hypertension   Pulmonary hypertension (HCC)   Dehydration with hyponatremia   Acute bronchitis    Acute COPD exacerbation with acute respiratory failure Patient is improved this morning. Continue current treatment. Including antibiotics, steroids and nebulizer treatments. Patient is on home oxygen. Continue O2. Patient has a long-standing history of COPD along with pulmonary fibrosis. She is on chronic steroids.  Diabetes mellitus type 2, uncontrolled CBGs are elevated, most likely secondary to steroids. Increase dose of Levemir. Add meal coverage. Change sliding scale to resistant. HbA1c was 7.8 in  May.  History of essential hypertension Continue to monitor blood pressures closely. Continue her home medications.  History of pulmonary hypertension Stable. Resume diuretics tomorrow.  Mild dehydration with hyponatremia Improved with hydration. Continued to monitor.  History of depression Continue her psychotropic agents.   DVT Prophylaxis: Lovenox    Code Status: Full code  Family Communication: Discussed with the patient  Disposition Plan: Continue current management. Patient is slowly improving. Mobilize.      [facility]  [facility] Pager [phone] 12/10/2014, 10:06 AM  If 7PM-7AM, please contact night-coverage at [email or web address], password [facility]

## 2014-12-10 NOTE — Evaluation (Signed)
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 Patient was noted to have acute COPD exacerbation  Clinical Impression  Pt presents with impairments as indicated below.  Pt will benefit from further acute PT services to maximize safety and independence with functional mobility and to increase activity tolerance.  No follow-up PT recommended at this time.    Follow Up Recommendations No PT follow up;Supervision - Intermittent    Equipment Recommendations  None recommended by PT    Recommendations for Other Services       Precautions / Restrictions Precautions Precautions: Fall Restrictions Weight Bearing Restrictions: No      Mobility  Bed Mobility Overal bed mobility: Modified Independent             General bed mobility comments: increase time and use of bed rails  Transfers Overall transfer level: Needs assistance Equipment used: Straight cane Transfers: Sit to/from Stand Sit to Stand: Min guard (for stability and safety)            Ambulation/Gait Ambulation/Gait assistance: Min guard Ambulation Distance (Feet): 100 Feet Assistive device: Straight cane Gait Pattern/deviations: Step-through pattern;Decreased stride length Gait velocity: slow Gait velocity interpretation: Below normal speed for age/gender General Gait Details: Pt with increased tremors with fatigue during ambulation.  Overall mobilizes well but is somewhat unsteady during gait.  Mild increase in SOB noted but VSS throughout.  Stairs            Wheelchair Mobility    Modified Rankin (Stroke Patients Only)       Balance Overall balance  assessment: Needs assistance Sitting-balance support: Feet supported;No upper extremity supported Sitting balance-Leahy Scale: Good     Standing balance support: Single extremity supported;During functional activity Standing balance-Leahy Scale: Fair                               Pertinent Vitals/Pain Pain Assessment: No/denies pain    Home Living Family/patient expects to be discharged to:: Private residence Living Arrangements: Other (Comment) (grandaughter lives with her) Available Help at Discharge: Family;Available PRN/intermittently Type of Home: Mobile home Home Access: Stairs to enter Entrance Stairs-Rails: Right;Left Entrance Stairs-Number of Steps: 4 Home Layout: One level Home Equipment: Cane - single point;Walker - 2 wheels      Prior Function Level of Independence: Independent with assistive device(s) (cane)               Hand Dominance        Extremity/Trunk Assessment   Upper Extremity Assessment: Overall WFL for tasks assessed           Lower Extremity Assessment: Overall WFL for tasks assessed         Communication   Communication: No difficulties  Cognition Arousal/Alertness: Awake/alert Behavior During Therapy: WFL for tasks assessed/performed Overall Cognitive Status: Within Functional Limits for tasks assessed                      General Comments General comments (skin integrity, edema, etc.): Pt with tremors at rest that she notes started about 4 months ago and have gotten worse.  Tremors increased with fatigue during activity. Pt educated on pursed  lip breathing and energy conservation techniques as well as general LE exercises.    Exercises        Assessment/Plan    PT Assessment Patient needs continued PT services  PT Diagnosis Difficulty walking;Other (comment) (decreased activity tolerance)   PT Problem List Decreased activity tolerance;Decreased balance;Decreased mobility;Decreased  coordination;Decreased knowledge of use of DME;Decreased safety awareness;Cardiopulmonary status limiting activity  PT Treatment Interventions DME instruction;Gait training;Stair training;Functional mobility training;Therapeutic activities;Therapeutic exercise;Balance training;Patient/family education   PT Goals (Current goals can be found in the Care Plan section) Acute Rehab PT Goals Patient Stated Goal: none stated PT Goal Formulation: With patient/family Time For Goal Achievement: 12/24/14 Potential to Achieve Goals: Good    Frequency Min 3X/week   Barriers to discharge        Co-evaluation               End of Session Equipment Utilized During Treatment: Gait belt;Oxygen (2L nasal cannula) Activity Tolerance: Patient tolerated treatment well Patient left: in chair;with call bell/phone within reach;with family/visitor present Nurse Communication: Mobility status         Time: 6387-5643 PT Time Calculation (min) (ACUTE ONLY): 21 min   Charges:   PT Evaluation $Initial PT Evaluation Tier I: 1 Procedure     PT G CodesEstill Bamberg Miklo Aken Dec 24, 2014, 4:38 PM  Lorita Officer, SPT

## 2014-12-10 NOTE — Progress Notes (Addendum)
Inpatient Diabetes Program Recommendations  AACE/ADA: New Consensus Statement on Inpatient Glycemic Control (2015)  Target Ranges:  Prepandial:   less than 140 mg/dL      Peak postprandial:   less than 180 mg/dL (1-2 hours)      Critically ill patients:  140 - 180 mg/dL   Review of Glycemic Control  Diabetes history: DM 2 Outpatient Diabetes medications: Levemir 40 units, Humalog 11 units TID meal coverage Current orders for Inpatient glycemic control: Levemir 40 Daily, Novolog Moderate TID  Inpatient Diabetes Program Recommendations: Insulin - Basal: While on IV Solumedrol 125 TID, please consider temporarily increasing basal insulin to Levemir 48-50 units Daily. Insulin - Meal Coverage: Patient takes 11 units meal coverage at home. Please consider starting Novolog 6-8 units TID meal coverage.   Thanks,  Tama Headings RN, MSN, [facility] Inpatient Diabetes Coordinator Team Pager [phone] (8a-5p)

## 2014-12-11 LAB — PROTEIN / CREATININE RATIO, URINE
Creatinine, Urine: 58.07 mg/dL
Total Protein, Urine: <6 mg/dL

## 2014-12-11 LAB — GLUCOSE, CAPILLARY
GLUCOSE-CAPILLARY: 400 mg/dL — AB (ref 65–99)
GLUCOSE-CAPILLARY: 304 mg/dL — AB (ref 65–99)
Glucose-Capillary: 285 mg/dL — ABNORMAL HIGH (ref 65–99)
Glucose-Capillary: 347 mg/dL — ABNORMAL HIGH (ref 65–99)

## 2014-12-11 LAB — CBC
HEMATOCRIT: 35.5 % — AB (ref 36.0–46.0)
HEMOGLOBIN: 11.7 g/dL — AB (ref 12.0–15.0)
MCH: 32.3 pg (ref 26.0–34.0)
MCHC: 33 g/dL (ref 30.0–36.0)
MCV: 98.1 fL (ref 78.0–100.0)
Platelets: 194 10*3/uL (ref 150–400)
RBC: 3.62 MIL/uL — ABNORMAL LOW (ref 3.87–5.11)
RDW: 13.3 % (ref 11.5–15.5)
WBC: 12.5 10*3/uL — ABNORMAL HIGH (ref 4.0–10.5)

## 2014-12-11 LAB — NA AND K (SODIUM & POTASSIUM), RAND UR
POTASSIUM UR: 49 mmol/L
SODIUM UR: 29 mmol/L

## 2014-12-11 LAB — BASIC METABOLIC PANEL
ANION GAP: 10 (ref 5–15)
BUN: 41 mg/dL — ABNORMAL HIGH (ref 6–20)
CHLORIDE: 93 mmol/L — AB (ref 101–111)
CO2: 29 mmol/L (ref 22–32)
CREATININE: 1.36 mg/dL — AB (ref 0.44–1.00)
Calcium: 9.7 mg/dL (ref 8.9–10.3)
GFR calc non Af Amer: 38 mL/min — ABNORMAL LOW (ref >60)
GFR, EST AFRICAN AMERICAN: 44 mL/min — AB (ref >60)
GLUCOSE: 268 mg/dL — AB (ref 65–99)
Potassium: 4.3 mmol/L (ref 3.5–5.1)
Sodium: 132 mmol/L — ABNORMAL LOW (ref 135–145)

## 2014-12-11 LAB — PROTEIN, URINE, RANDOM: Total Protein, Urine: <6 mg/dL

## 2014-12-11 MED ORDER — IPRATROPIUM-ALBUTEROL 0.5-2.5 (3) MG/3ML IN SOLN
3.0000 mL | Freq: Three times a day (TID) | RESPIRATORY_TRACT | Status: DC
  Administered 2014-12-11 – 2014-12-14 (×10): 3 mL via RESPIRATORY_TRACT
  Filled 2014-12-11 (×10): qty 3

## 2014-12-11 MED ORDER — METHYLPREDNISOLONE SODIUM SUCC 125 MG IJ SOLR
60.0000 mg | Freq: Three times a day (TID) | INTRAMUSCULAR | Status: DC
  Administered 2014-12-11 – 2014-12-12 (×3): 60 mg via INTRAVENOUS
  Filled 2014-12-11 (×3): qty 2

## 2014-12-11 NOTE — Progress Notes (Signed)
[facility] PROGRESS NOTE  Penny Curry QTM:[id number] DOB: [birth year] DOA: 12/09/2014  PCP: Ronita Hipps, MD  Brief HPI: 74 year old Caucasian female with a past medical history of hypertension, diabetes, COPD and pulmonary fibrosis who is glucocorticoid dependent and on home oxygen, presented with two-week history of progressively worsening shortness of breath. Patient was noted to have acute COPD exacerbation. She was hospitalized for further management.  Past medical history:  Past Medical History  Diagnosis Date  . HTN (hypertension)   . Diabetes mellitus   . Fibrosis of lung ([place])   . CHF (congestive heart failure) ([place])   . GERD (gastroesophageal reflux disease)   . Chronic headache     migraines  . UTI (urinary tract infection)     " MULTIPLE TIMES THIS PAST YEAR "  . Arthritis   . Anemia   . Cataract   . COPD (chronic obstructive pulmonary disease) ([place])   . Depression   . Osteoporosis   . Hyperlipidemia     Consultants: None  Procedures: None  Antibiotics: Levaquin  Subjective: Patient feels better this morning. Less wheezing compared to yesterday. Not coughing as much. Denies any chest pain.   Objective: Vital Signs  Filed Vitals:   12/10/14 1638 12/10/14 2059 12/11/14 0617 12/11/14 0822  BP: 119/60 115/69 122/65   Pulse: 101 96 83 93  Temp: 97.9 F (36.6 C) 97.2 F (36.2 C) 97.7 F (36.5 C)   TempSrc: Oral Oral Oral   Resp: 14 16 18 18   Height:      Weight:   87 kg (191 lb 12.8 oz)   SpO2: 98% 99% 99% 99%    Intake/Output Summary (Last 24 hours) at 12/11/14 0850 Last data filed at 12/11/14 6256  Gross per 24 hour  Intake 1846.67 ml  Output   2100 ml  Net -253.33 ml   Filed Weights   12/10/14 0210 12/11/14 0617  Weight: 87.136 kg (192 lb 1.6 oz) 87 kg (191 lb 12.8 oz)    General appearance: alert, cooperative, appears stated age, no distress and moderately obese Resp: Improved air entry bilaterally. Much less wheezing today  compared to yesterday. No crackles or rhonchi.  Cardio: regular rate and rhythm, S1, S2 normal, no murmur, click, rub or gallop GI: soft, non-tender; bowel sounds normal; no masses,  no organomegaly Extremities: extremities normal, atraumatic, no cyanosis or edema Neurologic: Alert and oriented 3. No focal neurological deficits are noted.  Lab Results:  Basic Metabolic Panel:  Recent Labs Lab 12/09/14 1740 12/10/14 0240 12/11/14 0300  NA 129* 131* 132*  K 4.4 4.0 4.3  CL 84* 87* 93*  CO2 32 32 29  GLUCOSE 309* 274* 268*  BUN 32* 33* 41*  CREATININE 1.51* 1.39* 1.36*  CALCIUM 9.8 9.5 9.7   Liver Function Tests:  Recent Labs Lab 12/10/14 0240  AST 26  ALT 33  ALKPHOS 63  BILITOT 0.5  PROT 6.6  ALBUMIN 3.4*   CBC:  Recent Labs Lab 12/09/14 1740 12/10/14 0240 12/11/14 0300  WBC 12.6* 11.1* 12.5*  NEUTROABS 9.9* 7.1  --   HGB 12.2 11.5* 11.7*  HCT 36.2 34.0* 35.5*  MCV 96.0 96.0 98.1  PLT 232 200 194   BNP (last 3 results)  Recent Labs  09/06/14 2150 10/22/14 1200 12/09/14 1930  BNP 127.2* 107.8* 93.1     CBG:  Recent Labs Lab 12/10/14 1310 12/10/14 1345 12/10/14 1636 12/10/14 2145 12/11/14 0608  GLUCAP 479* 467* 417* 413* 285*  No results found for this or any previous visit (from the past 240 hour(s)).    Studies/Results: Dg Chest 2 View  12/09/2014  CLINICAL DATA:  Two week history of shortness of breath and productive cough. Concern for worsening respiratory infection. History of pulmonary sarcoid. EXAM: CHEST  2 VIEW COMPARISON:  Radiograph 10/22/2014.  CT 12/2014 FINDINGS: Cardiomediastinal contours are unchanged. Interstitial reticular opacity is are unchanged in degree compared to prior exam. The small ground-glass regions on prior CT are not well seen. No confluent airspace disease. No pleural effusion. No findings of superimposed pulmonary edema. No pneumothorax. Unchanged appearance of the osseous structures. IMPRESSION:  Unchanged appearance of the chest with increased interstitial markings, stable in degree from prior. No superimposed acute process. Electronically Signed   By: Jeb Levering M.D.   On: 12/09/2014 18:15    Medications:  Scheduled: . aspirin  81 mg Oral Daily  . citalopram  40 mg Oral QHS  . colesevelam  625 mg Oral Q supper  . divalproex  1,000 mg Oral QHS  . enoxaparin (LOVENOX) injection  40 mg Subcutaneous Q24H  . fluticasone  2 spray Each Nare Daily  . gabapentin  300 mg Oral QHS  . guaiFENesin  600 mg Oral BID  . insulin aspart  0-20 Units Subcutaneous TID WC  . insulin aspart  0-5 Units Subcutaneous QHS  . insulin aspart  8 Units Subcutaneous TID WC  . insulin detemir  50 Units Subcutaneous Q breakfast  . ipratropium-albuterol  3 mL Nebulization TID  . levofloxacin (LEVAQUIN) IV  750 mg Intravenous Q48H  . methylPREDNISolone (SOLU-MEDROL) injection  80 mg Intravenous 3 times per day  . pantoprazole  40 mg Oral Daily  . potassium chloride  20 mEq Oral Daily  . rOPINIRole  3 mg Oral BID  . sodium chloride  3 mL Intravenous Q12H  . sodium chloride  3 mL Intravenous Q12H  . torsemide  50 mg Oral BID  . traMADol  100 mg Oral QHS  . traMADol  50 mg Oral Daily  . Vitamin D (Ergocalciferol)  50,000 Units Oral Q Fri   Continuous:  KXF:GHWEXH chloride, acetaminophen, albuterol, benzonatate, loratadine, ondansetron **OR** ondansetron (ZOFRAN) IV, sodium chloride  Assessment/Plan:  Principal Problem:   COPD exacerbation (HCC) Active Problems:   Diabetes mellitus type 2, controlled ([place])   Essential hypertension   Pulmonary hypertension (HCC)   Dehydration with hyponatremia   Acute bronchitis    Acute COPD exacerbation with acute respiratory failure Patient is slowly improving. Taper steroids. Continue antibiotics, nebulizer treatments. Patient is on home oxygen. Continue O2. Patient has a long-standing history of COPD along with pulmonary fibrosis. She is on chronic  steroids.  Diabetes mellitus type 2, uncontrolled CBGs are elevated, most likely secondary to steroids. Dose of Levemir was increased. Meal coverage was added. Continue resistance sliding scale coverage as well. HbA1c was 7.8 in May.  History of essential hypertension Continue to monitor blood pressures closely. Continue her home medications.  History of pulmonary hypertension Stable. Resume diuretics.  Mild dehydration with hyponatremia Improved with hydration. Continued to monitor.  History of depression Continue her psychotropic agents.   DVT Prophylaxis: Lovenox    Code Status: Full code  Family Communication: Discussed with the patient  Disposition Plan: Continue current management. Patient is slowly improving. Mobilize.    LOS: 1 day   [place] [facility] Pager [phone] 12/11/2014, 8:50 AM  If 7PM-7AM, please contact night-coverage at [email or web address], password [facility]

## 2014-12-11 NOTE — Evaluation (Addendum)
Occupational Therapy Evaluation Patient Details Name: Penny Curry MRN: [id number] DOB: [birth year] Today's Date: 12/11/2014    History of Present Illness 74 year old Caucasian female with a past medical history of hypertension, diabetes, COPD and pulmonary fibrosis who is glucocorticoid dependent and on home oxygen, presented with two-week history of progressively worsening shortness of breath. Patient was noted to have acute COPD exacerbation   Clinical Impression   Pt admitted with above. Pt independent with ADLs, PTA (however, does report someone is in the house when she showers). Feel pt will benefit from acute OT to increase activity tolerance, strength, and independence prior to d/c.     Follow Up Recommendations  No OT follow up;Supervision - Intermittent    Equipment Recommendations  None recommended by OT    Recommendations for Other Services       Precautions / Restrictions Precautions Precautions: Fall Restrictions Weight Bearing Restrictions: No      Mobility Bed Mobility Overal bed mobility: Needs Assistance Bed Mobility: Sit to Supine       Sit to supine: Supervision      Transfers Overall transfer level: Needs assistance   Transfers: Sit to/from Stand Sit to Stand: Supervision              Balance     Pt able to pick up item off of floor without LOB. History of falls.                                        ADL Overall ADL's : Needs assistance/impaired     Grooming: Wash/dry hands;Supervision/safety;Standing       Lower Body Bathing: Supervison/ safety (simulated standing)           Toilet Transfer: Supervision/safety;Ambulation (pushed IV pole; also used cane later in session)   Toileting- Clothing Manipulation and Hygiene: Supervision/safety;Sit to/from stand       Functional mobility during ADLs: Supervision/safety;Cane (also pushed IV pole some and walked without anything) General ADL Comments: Educated  on energy conservation. Discussed options for shower chair and recommended sitting for LB bathing.     Vision     Perception     Praxis      Pertinent Vitals/Pain Pain Assessment: 0-10 Pain Score: 5  Pain Location: head Pain Descriptors / Indicators: Headache Pain Intervention(s): Monitored during session   Pt on O2 and sats remained in 90s.     Hand Dominance     Extremity/Trunk Assessment Upper Extremity Assessment Upper Extremity Assessment: Generalized weakness; tremors   Lower Extremity Assessment Lower Extremity Assessment: Defer to PT evaluation       Communication Communication Communication: No difficulties   Cognition Arousal/Alertness: Awake/alert Behavior During Therapy: WFL for tasks assessed/performed Overall Cognitive Status: Within Functional Limits for tasks assessed (however did seem to have decreased safety awareness)                     General Comments       Exercises       Shoulder Instructions      Home Living Family/patient expects to be discharged to:: Private residence Living Arrangements: Other (Comment) (grandaughter lives with her) Available Help at Discharge: Family;Available PRN/intermittently Type of Home: Mobile home Home Access: Stairs to enter Entrance Stairs-Number of Steps: 4 Entrance Stairs-Rails: Right;Left Home Layout: One level     Bathroom Shower/Tub: Teacher, early years/pre: Standard  Home Equipment: Kasandra Knudsen - single point;Walker - 2 wheels          Prior Functioning/Environment Level of Independence: Independent with assistive device(s)        Comments: cane; reports someone is in house when she gets in shower    OT Diagnosis: Generalized weakness   OT Problem List: Pain;Decreased knowledge of precautions;Decreased knowledge of use of DME or AE;Decreased activity tolerance;Decreased strength;decreased safety awareness   OT Treatment/Interventions: Self-care/ADL training;DME  and/or AE instruction;Energy conservation;Therapeutic exercise;Therapeutic activities;Patient/family education;Balance training    OT Goals(Current goals can be found in the care plan section) Acute Rehab OT Goals Patient Stated Goal: not stated OT Goal Formulation: With patient Time For Goal Achievement: 12/18/14 Potential to Achieve Goals: Good ADL Goals Pt Will Perform Lower Body Dressing: with modified independence;sit to/from stand Pt Will Perform Tub/Shower Transfer: Tub transfer;with supervision;ambulating;shower seat Additional ADL Goal #1: Pt will independently verbalize 3/3 energy conservation techniques and utilize during session as needed.  OT Frequency: Min 2X/week   Barriers to D/C:            Co-evaluation              End of Session Equipment Utilized During Treatment: Oxygen;Other (comment) (cane)  Activity Tolerance: Patient tolerated treatment well Patient left: in bed;with call bell/phone within reach;with bed alarm set   Time: 0881-1031 OT Time Calculation (min): 16 min Charges:  OT General Charges $OT Visit: 1 Procedure OT Evaluation $Initial OT Evaluation Tier I: 1 Procedure G-CodesBenito Mccreedy OTR/L [phone] 12/11/2014, 5:01 PM

## 2014-12-12 LAB — GLUCOSE, CAPILLARY
GLUCOSE-CAPILLARY: 327 mg/dL — AB (ref 65–99)
GLUCOSE-CAPILLARY: 354 mg/dL — AB (ref 65–99)
GLUCOSE-CAPILLARY: 342 mg/dL — AB (ref 65–99)
Glucose-Capillary: 366 mg/dL — ABNORMAL HIGH (ref 65–99)

## 2014-12-12 MED ORDER — LEVOFLOXACIN 750 MG PO TABS
750.0000 mg | ORAL_TABLET | ORAL | Status: DC
  Administered 2014-12-13 – 2014-12-15 (×2): 750 mg via ORAL
  Filled 2014-12-12 (×2): qty 1

## 2014-12-12 MED ORDER — METHYLPREDNISOLONE SODIUM SUCC 125 MG IJ SOLR
60.0000 mg | Freq: Two times a day (BID) | INTRAMUSCULAR | Status: DC
  Administered 2014-12-12 – 2014-12-13 (×2): 60 mg via INTRAVENOUS
  Filled 2014-12-12 (×2): qty 2

## 2014-12-12 NOTE — Progress Notes (Signed)
[facility] PROGRESS NOTE  Penny Curry:[id number] DOB: 18-Oct-1940 DOA: 12/09/2014  PCP: Ronita Hipps, MD  Brief HPI: 74 year old Caucasian female with a past medical history of hypertension, diabetes, COPD and pulmonary fibrosis who is glucocorticoid dependent and on home oxygen, presented with two-week history of progressively worsening shortness of breath. Patient was noted to have acute COPD exacerbation. She was hospitalized for further management.  Past medical history:  Past Medical History  Diagnosis Date  . HTN (hypertension)   . Diabetes mellitus   . Fibrosis of lung ([place])   . CHF (congestive heart failure) ([place])   . GERD (gastroesophageal reflux disease)   . Chronic headache     migraines  . UTI (urinary tract infection)     " MULTIPLE TIMES THIS PAST YEAR "  . Arthritis   . Anemia   . Cataract   . COPD (chronic obstructive pulmonary disease) ([place])   . Depression   . Osteoporosis   . Hyperlipidemia     Consultants: None  Procedures: None  Antibiotics: Levaquin  Subjective: Patient continues to improve. Denies any complaints this morning. Breathing is better. Denies any chest pain. Not coughing as much.    Objective: Vital Signs  Filed Vitals:   12/11/14 1230 12/11/14 1429 12/11/14 2031 12/12/14 0443  BP: 116/60  106/60 150/60  Pulse: 89 90 80 95  Temp: 97.6 F (36.4 C)  97.9 F (36.6 C) 97.8 F (36.6 C)  TempSrc: Oral  Oral Oral  Resp: 18 20 20 20   Height:      Weight:    86.274 kg (190 lb 3.2 oz)  SpO2: 98% 99% 99% 100%    Intake/Output Summary (Last 24 hours) at 12/12/14 0818 Last data filed at 12/12/14 0600  Gross per 24 hour  Intake   1110 ml  Output   4500 ml  Net  -3390 ml   Filed Weights   12/10/14 0210 12/11/14 0617 12/12/14 0443  Weight: 87.136 kg (192 lb 1.6 oz) 87 kg (191 lb 12.8 oz) 86.274 kg (190 lb 3.2 oz)    General appearance: alert, cooperative, appears stated age, no distress and moderately obese Resp:  Improved air entry. Less wheezing today. No crackles or rhonchi.  Cardio: regular rate and rhythm, S1, S2 normal, no murmur, click, rub or gallop GI: soft, non-tender; bowel sounds normal; no masses,  no organomegaly Neurologic: Alert and oriented 3. No focal neurological deficits are noted.  Lab Results:  Basic Metabolic Panel:  Recent Labs Lab 12/09/14 1740 12/10/14 0240 12/11/14 0300  NA 129* 131* 132*  K 4.4 4.0 4.3  CL 84* 87* 93*  CO2 32 32 29  GLUCOSE 309* 274* 268*  BUN 32* 33* 41*  CREATININE 1.51* 1.39* 1.36*  CALCIUM 9.8 9.5 9.7   Liver Function Tests:  Recent Labs Lab 12/10/14 0240  AST 26  ALT 33  ALKPHOS 63  BILITOT 0.5  PROT 6.6  ALBUMIN 3.4*   CBC:  Recent Labs Lab 12/09/14 1740 12/10/14 0240 12/11/14 0300  WBC 12.6* 11.1* 12.5*  NEUTROABS 9.9* 7.1  --   HGB 12.2 11.5* 11.7*  HCT 36.2 34.0* 35.5*  MCV 96.0 96.0 98.1  PLT 232 200 194   BNP (last 3 results)  Recent Labs  09/06/14 2150 10/22/14 1200 12/09/14 1930  BNP 127.2* 107.8* 93.1     CBG:  Recent Labs Lab 12/11/14 0608 12/11/14 1154 12/11/14 1645 12/11/14 2135 12/12/14 0638  GLUCAP 285* 400* 304* 347* 327*  No results found for this or any previous visit (from the past 240 hour(s)).    Studies/Results: No results found.  Medications:  Scheduled: . aspirin  81 mg Oral Daily  . citalopram  40 mg Oral QHS  . colesevelam  625 mg Oral Q supper  . divalproex  1,000 mg Oral QHS  . enoxaparin (LOVENOX) injection  40 mg Subcutaneous Q24H  . fluticasone  2 spray Each Nare Daily  . gabapentin  300 mg Oral QHS  . guaiFENesin  600 mg Oral BID  . insulin aspart  0-20 Units Subcutaneous TID WC  . insulin aspart  0-5 Units Subcutaneous QHS  . insulin aspart  8 Units Subcutaneous TID WC  . insulin detemir  50 Units Subcutaneous Q breakfast  . ipratropium-albuterol  3 mL Nebulization TID  . [START ON 12/14/2014] levofloxacin  750 mg Oral Q48H  . methylPREDNISolone  (SOLU-MEDROL) injection  60 mg Intravenous Q12H  . pantoprazole  40 mg Oral Daily  . potassium chloride  20 mEq Oral Daily  . rOPINIRole  3 mg Oral BID  . sodium chloride  3 mL Intravenous Q12H  . sodium chloride  3 mL Intravenous Q12H  . torsemide  50 mg Oral BID  . traMADol  100 mg Oral QHS  . traMADol  50 mg Oral Daily  . Vitamin D (Ergocalciferol)  50,000 Units Oral Q Fri   Continuous:  KTG:YBWLSL chloride, acetaminophen, albuterol, benzonatate, loratadine, ondansetron **OR** ondansetron (ZOFRAN) IV, sodium chloride  Assessment/Plan:  Principal Problem:   COPD exacerbation (HCC) Active Problems:   Diabetes mellitus type 2, controlled ([place])   Essential hypertension   Pulmonary hypertension (HCC)   Dehydration with hyponatremia   Acute bronchitis    Acute COPD exacerbation with acute respiratory failure Patient is improving. Continue to taper steroids. Change to oral antibiotics. Continue nebulizer treatments. Patient is on home oxygen. Continue O2. Patient has a long-standing history of COPD along with pulmonary fibrosis. She is on chronic steroids.  Diabetes mellitus type 2, uncontrolled CBGs are elevated, most likely secondary to steroids. Dose of Levemir was increased. Meal coverage was added. Continue resistance sliding scale coverage as well. HbA1c was 7.8 in May. CBGs should improve as steroid is tapered down.  History of essential hypertension Continue to monitor blood pressures closely. Continue her home medications.  History of pulmonary hypertension Stable. Continue diuretics.  Mild dehydration with hyponatremia Improved with hydration. Continued to monitor.  History of depression Continue her psychotropic agents.   DVT Prophylaxis: Lovenox    Code Status: Full code  Family Communication: Discussed with the patient  Disposition Plan: Mobilize. Anticipate discharge tomorrow.    LOS: 2 days   [place] [facility] Pager  [phone] 12/12/2014, 8:18 AM  If 7PM-7AM, please contact night-coverage at [email or web address], password [facility]

## 2014-12-13 LAB — GLUCOSE, CAPILLARY
GLUCOSE-CAPILLARY: 236 mg/dL — AB (ref 65–99)
Glucose-Capillary: 362 mg/dL — ABNORMAL HIGH (ref 65–99)
Glucose-Capillary: 343 mg/dL — ABNORMAL HIGH (ref 65–99)
Glucose-Capillary: 163 mg/dL — ABNORMAL HIGH (ref 65–99)

## 2014-12-13 MED ORDER — PREDNISONE 20 MG PO TABS
40.0000 mg | ORAL_TABLET | Freq: Two times a day (BID) | ORAL | Status: DC
  Administered 2014-12-13 (×2): 40 mg via ORAL
  Filled 2014-12-13 (×3): qty 2

## 2014-12-13 NOTE — Progress Notes (Signed)
Physical Therapy Treatment Patient Details Name: Penny Curry MRN: [id number] DOB: 03/28/1940 Today's Date: 12/13/2014    History of Present Illness 74 year old Caucasian female with a past medical history of hypertension, diabetes, COPD and pulmonary fibrosis who is glucocorticoid dependent and on home oxygen, presented with two-week history of progressively worsening shortness of breath. Patient was noted to have acute COPD exacerbation    PT Comments    Pt was noted to have a good tolerance for gait esp with addition of stairs for home.  Has slow pace and uses good judgment of her condition and tolerance.  Her plan is to go home likely today and will be able to get up steps as her son is coming to help her.  Will anticipate her therapy needs are minor as she appears to be at baseline.    Follow Up Recommendations  No PT follow up     Equipment Recommendations  None recommended by PT    Recommendations for Other Services       Precautions / Restrictions Precautions Precautions: Fall Restrictions Weight Bearing Restrictions: No    Mobility  Bed Mobility Overal bed mobility: Modified Independent Bed Mobility: Supine to Sit;Sit to Supine     Supine to sit: Modified independent (Device/Increase time) Sit to supine: Modified independent (Device/Increase time)   General bed mobility comments: bed flat to get in and did not use bedrails  Transfers Overall transfer level: Needs assistance Equipment used: Straight cane Transfers: Sit to/from Stand;Stand Pivot Transfers Sit to Stand: Supervision Stand pivot transfers: Supervision          Ambulation/Gait Ambulation/Gait assistance: Min guard Ambulation Distance (Feet): 100 Feet Assistive device: Straight cane Gait Pattern/deviations: Step-through pattern;Trunk flexed;Narrow base of support Gait velocity: slow Gait velocity interpretation: Below normal speed for age/gender General Gait Details: no real SOB noted and  O2 sats were stable with O2 used.   Stairs Stairs: Yes Stairs assistance: Min guard Stair Management: One rail Right;Step to pattern;Alternating pattern;Forwards;With cane Number of Stairs: 4    Wheelchair Mobility    Modified Rankin (Stroke Patients Only)       Balance Overall balance assessment: Needs assistance Sitting-balance support: Feet supported Sitting balance-Leahy Scale: Good   Postural control: Posterior lean Standing balance support: Single extremity supported Standing balance-Leahy Scale: Fair Standing balance comment: fair- dynamic balance                    Cognition Arousal/Alertness: Awake/alert Behavior During Therapy: WFL for tasks assessed/performed Overall Cognitive Status: Within Functional Limits for tasks assessed                      Exercises      General Comments General comments (skin integrity, edema, etc.): tremors were minor today, pt taking her time and demonstrating a very motivated safe transition OOB to down hall and on staircase      Pertinent Vitals/Pain Pain Assessment: No/denies pain    Home Living                      Prior Function            PT Goals (current goals can now be found in the care plan section) Acute Rehab PT Goals Patient Stated Goal: not stated Progress towards PT goals: Progressing toward goals    Frequency  Min 3X/week    PT Plan Current plan remains appropriate    Co-evaluation  End of Session Equipment Utilized During Treatment: Gait belt;Oxygen (2L) Activity Tolerance: Patient tolerated treatment well Patient left: in bed;with call bell/phone within reach;with bed alarm set     Time: 6967-8938 PT Time Calculation (min) (ACUTE ONLY): 24 min  Charges:  $Gait Training: 8-22 mins $Therapeutic Activity: 8-22 mins                    G Codes:      Ramond Dial 11-Jan-2015, 6:29 PM   Mee Hives, PT MS Acute Rehab Dept. Number: [phone] [phone]  and [place] [phone]

## 2014-12-13 NOTE — Progress Notes (Signed)
[facility] PROGRESS NOTE  JEZABEL LECKER PJA:[id number] DOB: [birth year] DOA: 12/09/2014  PCP: Ronita Hipps, MD  Brief HPI: 74 year old Caucasian female with a past medical history of hypertension, diabetes, COPD and pulmonary fibrosis who is glucocorticoid dependent and on home oxygen, presented with two-week history of progressively worsening shortness of breath. Patient was noted to have acute COPD exacerbation. She was hospitalized for further management.  Past medical history:  Past Medical History  Diagnosis Date  . HTN (hypertension)   . Diabetes mellitus   . Fibrosis of lung ([place])   . CHF (congestive heart failure) ([place])   . GERD (gastroesophageal reflux disease)   . Chronic headache     migraines  . UTI (urinary tract infection)     " MULTIPLE TIMES THIS PAST YEAR "  . Arthritis   . Anemia   . Cataract   . COPD (chronic obstructive pulmonary disease) ([place])   . Depression   . Osteoporosis   . Hyperlipidemia     Consultants: None  Procedures: None  Antibiotics: Levaquin  Subjective: Patient feels better. Not as much coughing as before. Breathing is improving. Denies any chest pain. No nausea, vomiting.     Objective: Vital Signs  Filed Vitals:   12/12/14 1701 12/12/14 2100 12/12/14 2142 12/13/14 0536  BP: 102/72  106/70 151/75  Pulse: 83  80 81  Temp: 97.8 F (36.6 C)  97.6 F (36.4 C) 97.5 F (36.4 C)  TempSrc: Oral  Oral Oral  Resp: 18  17 18   Height:      Weight:    84.46 kg (186 lb 3.2 oz)  SpO2: 97% 97% 98% 100%    Intake/Output Summary (Last 24 hours) at 12/13/14 0751 Last data filed at 12/13/14 0600  Gross per 24 hour  Intake   1203 ml  Output   3900 ml  Net  -2697 ml   Filed Weights   12/11/14 0617 12/12/14 0443 12/13/14 0536  Weight: 87 kg (191 lb 12.8 oz) 86.274 kg (190 lb 3.2 oz) 84.46 kg (186 lb 3.2 oz)    General appearance: alert, cooperative, appears stated age, no distress and moderately obese Resp: Air entry  continues to improve bilaterally. Less wheezing today. No crackles or rhonchi.  Cardio: regular rate and rhythm, S1, S2 normal, no murmur, click, rub or gallop GI: soft, non-tender; bowel sounds normal; no masses,  no organomegaly Neurologic: Alert and oriented 3. No focal neurological deficits are noted.  Lab Results:  Basic Metabolic Panel:  Recent Labs Lab 12/09/14 1740 12/10/14 0240 12/11/14 0300  NA 129* 131* 132*  K 4.4 4.0 4.3  CL 84* 87* 93*  CO2 32 32 29  GLUCOSE 309* 274* 268*  BUN 32* 33* 41*  CREATININE 1.51* 1.39* 1.36*  CALCIUM 9.8 9.5 9.7   Liver Function Tests:  Recent Labs Lab 12/10/14 0240  AST 26  ALT 33  ALKPHOS 63  BILITOT 0.5  PROT 6.6  ALBUMIN 3.4*   CBC:  Recent Labs Lab 12/09/14 1740 12/10/14 0240 12/11/14 0300  WBC 12.6* 11.1* 12.5*  NEUTROABS 9.9* 7.1  --   HGB 12.2 11.5* 11.7*  HCT 36.2 34.0* 35.5*  MCV 96.0 96.0 98.1  PLT 232 200 194   BNP (last 3 results)  Recent Labs  09/06/14 2150 10/22/14 1200 12/09/14 1930  BNP 127.2* 107.8* 93.1     CBG:  Recent Labs Lab 12/11/14 2135 12/12/14 0638 12/12/14 1144 12/12/14 1633 12/12/14 2141  GLUCAP 347* 327* 354* 342*  366*    No results found for this or any previous visit (from the past 240 hour(s)).    Studies/Results: No results found.  Medications:  Scheduled: . aspirin  81 mg Oral Daily  . citalopram  40 mg Oral QHS  . colesevelam  625 mg Oral Q supper  . divalproex  1,000 mg Oral QHS  . enoxaparin (LOVENOX) injection  40 mg Subcutaneous Q24H  . fluticasone  2 spray Each Nare Daily  . gabapentin  300 mg Oral QHS  . guaiFENesin  600 mg Oral BID  . insulin aspart  0-20 Units Subcutaneous TID WC  . insulin aspart  0-5 Units Subcutaneous QHS  . insulin aspart  8 Units Subcutaneous TID WC  . insulin detemir  50 Units Subcutaneous Q breakfast  . ipratropium-albuterol  3 mL Nebulization TID  . [START ON 12/14/2014] levofloxacin  750 mg Oral Q48H  .  methylPREDNISolone (SOLU-MEDROL) injection  60 mg Intravenous Q12H  . pantoprazole  40 mg Oral Daily  . potassium chloride  20 mEq Oral Daily  . rOPINIRole  3 mg Oral BID  . sodium chloride  3 mL Intravenous Q12H  . sodium chloride  3 mL Intravenous Q12H  . torsemide  50 mg Oral BID  . traMADol  100 mg Oral QHS  . traMADol  50 mg Oral Daily  . Vitamin D (Ergocalciferol)  50,000 Units Oral Q Fri   Continuous:  HUO:HFGBMS chloride, acetaminophen, albuterol, benzonatate, loratadine, ondansetron **OR** ondansetron (ZOFRAN) IV, sodium chloride  Assessment/Plan:  Principal Problem:   COPD exacerbation (HCC) Active Problems:   Diabetes mellitus type 2, controlled ([place])   Essential hypertension   Pulmonary hypertension (HCC)   Dehydration with hyponatremia   Acute bronchitis    Acute COPD exacerbation with acute respiratory failure Patient continues to improve. Change to oral steroids and continue to taper. Continue oral antibiotics. Continue nebulizer treatments. Patient is on home oxygen. Continue O2. Patient has a long-standing history of COPD along with pulmonary fibrosis. She is on chronic steroids.  Diabetes mellitus type 2, uncontrolled CBGs are elevated, most likely secondary to steroids. Dose of Levemir was increased. Meal coverage was added. Continue resistance sliding scale coverage as well. HbA1c was 7.8 in May. CBGs should improve as steroid is tapered down.  History of essential hypertension Continue to monitor blood pressures closely. Continue her home medications.  History of pulmonary hypertension Stable. Continue diuretics.  Mild dehydration with hyponatremia Improved with hydration. Continued to monitor.  History of depression Continue her psychotropic agents.   DVT Prophylaxis: Lovenox    Code Status: Full code  Family Communication: Discussed with the patient  Disposition Plan: Mobilize. Anticipate discharge tomorrow.    LOS: 3 days    [facility] Pager [phone] 12/13/2014, 7:51 AM  If 7PM-7AM, please contact night-coverage at [email or web address], password [email or web address]

## 2014-12-13 NOTE — Progress Notes (Signed)
Inpatient Diabetes Program Recommendations  AACE/ADA: New Consensus Statement on Inpatient Glycemic Control (2015)  Target Ranges:  Prepandial:   less than 140 mg/dL      Peak postprandial:   less than 180 mg/dL (1-2 hours)      Critically ill patients:  140 - 180 mg/dL   Review of Glycemic Control  Results for Penny Curry, Penny Curry (MRN [id number]) as of 12/13/2014 13:22  Ref. Range 12/12/2014 11:44 12/12/2014 16:33 12/12/2014 21:41 12/13/2014 05:53 12/13/2014 11:41  Glucose-Capillary Latest Ref Range: 65-99 mg/dL 354 (H) 342 (H) 366 (H) 236 (H) 362 (H)    Diabetes history: DM 2 Outpatient Diabetes medications: Levemir 40 units, Humalog 11 units TID meal coverage Current orders for Inpatient glycemic control: Levemir 40 Daily, Novolog Moderate TID  Inpatient Diabetes Program Recommendations: Insulin - Basal: Prednisone 40mg  bid, please consider temporarily increasing basal insulin to Levemir 55 units qday. Titrate down as steroids are decreased.  Patient takes 11 units meal coverage at home. Please consider increasing Novolog to 12 units TID meal coverage- continue Novolog correction as ordered  Consider ordering an A1C to determine blood sugar control over the past 2-3 months.  Gentry Fitz, RN, BA, MHA, CDE Diabetes Coordinator Inpatient Diabetes Program  [phone] (Team Pager) [phone] ([place]) 12/13/2014 1:27 PM

## 2014-12-13 NOTE — Care Management Important Message (Signed)
Important Message  Patient Details  Name: Penny Curry MRN: [id number] Date of Birth: [birth year]   Medicare Important Message Given:  Yes-second notification given    [name] 12/13/2014, 12:17 PM

## 2014-12-13 NOTE — Progress Notes (Signed)
Occupational Therapy Treatment Patient Details Name: Penny Curry MRN: [id number] DOB: 1940-10-09 Today's Date: 12/13/2014    History of present illness 74 year old Caucasian female with a past medical history of hypertension, diabetes, COPD and pulmonary fibrosis who is glucocorticoid dependent and on home oxygen, presented with two-week history of progressively worsening shortness of breath. Patient was noted to have acute COPD exacerbation   OT comments  Pt. Making gains with skilled OT.  Able to complete light grooming and toileting tasks with S.  Also focused on reinforcement of energy conservation strategies during functional mobility and completion of ADLS.  Pt. Able to verbalize but still requries intermittent cues to slow pace.  Will continue to follow acutely.    Follow Up Recommendations  No OT follow up;Supervision - Intermittent    Equipment Recommendations  None recommended by OT    Recommendations for Other Services      Precautions / Restrictions Precautions Precautions: Fall       Mobility Bed Mobility Overal bed mobility: Modified Independent Bed Mobility: Sit to Supine       Sit to supine: Modified independent (Device/Increase time)   General bed mobility comments: hob flat, no rails, seated on right side of bed pt. able to scoot backwards and bring b les into bed and lay down, also able to bend b les and scoot self up to desired position in bed  Transfers Overall transfer level: Needs assistance Equipment used: Straight cane Transfers: Sit to/from [facility] Sit to Stand: Supervision Stand pivot transfers: Supervision            Balance                                   ADL Overall ADL's : Needs assistance/impaired     Grooming: Wash/dry hands;Supervision/safety;Standing               Lower Body Dressing: Supervision/safety;Cueing for safety;Cueing for sequencing;Cueing for compensatory  techniques;Sitting/lateral leans Lower Body Dressing Details (indicate cue type and reason): provided demonstration and instruction for donning/doffing socks by pulling b les into bed while sitting vs. bending forward to reach feet for energy conservation and safety.  pt. able to return demo and states it was much easier Toilet Transfer: Supervision/safety;Ambulation Toilet Transfer Details (indicate cue type and reason): uses can in R hand for ambulation, cues for safe furniture walking, as pt. continued to hold on to items with wheels, educated that she must grasp for items that are more sturdy she states "everyone keeps telling me that and i forget" Toileting- Clothing Manipulation and Hygiene: Sit to/from stand;Supervision/safety       Functional mobility during ADLs: Supervision/safety;Cane General ADL Comments: educated on energy conservation tech. and strategies during completion of self care and iads at home.  reviewed placing items in easy to reach places in the fridge and also having liquids in lighter smaller containers vs. heavy ie: gallon of milk      Vision                     Perception     Praxis      Cognition   Behavior During Therapy: [facility] for tasks assessed/performed Overall Cognitive Status: Within Functional Limits for tasks assessed                       Extremity/Trunk Assessment  Exercises     Shoulder Instructions       General Comments      Pertinent Vitals/ Pain       Pain Assessment: No/denies pain  Home Living                                          Prior Functioning/Environment              Frequency Min 2X/week     Progress Toward Goals  OT Goals(current goals can now be found in the care plan section)  Progress towards OT goals: Progressing toward goals     Plan Discharge plan remains appropriate    Co-evaluation                 End of Session Equipment Utilized  During Treatment: Oxygen;Other (comment);Gait belt (cane)   Activity Tolerance Patient tolerated treatment well   Patient Left in bed;with call bell/phone within reach;with bed alarm set   Nurse Communication          Time: 7939-0300 OT Time Calculation (min): 10 min  Charges: OT General Charges $OT Visit: 1 Procedure OT Treatments $Self Care/Home Management : 8-22 mins  Janice Coffin, COTA/L 12/13/2014, 1:55 PM

## 2014-12-13 NOTE — Consult Note (Signed)
   [facility] CM Inpatient Consult   12/13/2014  KATRINE RADICH 1940/09/12 [id number] Referral received to assess for care management services. Explained that [facility] is a covered benefit of her [employer].   Met with the patient regarding the benefits of [facility] services. Review information for [facility] Care Management and a brochure was provided with contact information.  Explained that [facility] does not interfere with or replace any services arranged by the inpatient care management staff.  Patient declined services with [facility].  For questions, please contact: Natividad Brood, RN BSN [facility] Liaison  [phone] business mobile phone

## 2014-12-14 ENCOUNTER — Other Ambulatory Visit: Payer: Self-pay

## 2014-12-14 ENCOUNTER — Inpatient Hospital Stay (HOSPITAL_COMMUNITY): Payer: Medicare Other

## 2014-12-14 DIAGNOSIS — I5032 Chronic diastolic (congestive) heart failure: Secondary | ICD-10-CM

## 2014-12-14 DIAGNOSIS — I4891 Unspecified atrial fibrillation: Secondary | ICD-10-CM

## 2014-12-14 LAB — BASIC METABOLIC PANEL
Anion gap: 19 — ABNORMAL HIGH (ref 5–15)
BUN: 89 mg/dL — AB (ref 6–20)
CALCIUM: 10.1 mg/dL (ref 8.9–10.3)
CO2: 37 mmol/L — ABNORMAL HIGH (ref 22–32)
Chloride: 80 mmol/L — ABNORMAL LOW (ref 101–111)
Creatinine, Ser: 1.9 mg/dL — ABNORMAL HIGH (ref 0.44–1.00)
GFR calc Af Amer: 29 mL/min — ABNORMAL LOW (ref >60)
GFR, EST NON AFRICAN AMERICAN: 25 mL/min — AB (ref >60)
GLUCOSE: 306 mg/dL — AB (ref 65–99)
Potassium: 3.4 mmol/L — ABNORMAL LOW (ref 3.5–5.1)
SODIUM: 136 mmol/L (ref 135–145)

## 2014-12-14 LAB — TROPONIN I
TROPONIN I: 0.04 ng/mL — AB (ref <0.031)
Troponin I: 0.03 ng/mL (ref <0.031)
Troponin I: 0.06 ng/mL — ABNORMAL HIGH (ref <0.031)

## 2014-12-14 LAB — GLUCOSE, CAPILLARY
GLUCOSE-CAPILLARY: 232 mg/dL — AB (ref 65–99)
Glucose-Capillary: 292 mg/dL — ABNORMAL HIGH (ref 65–99)
Glucose-Capillary: 353 mg/dL — ABNORMAL HIGH (ref 65–99)
Glucose-Capillary: 306 mg/dL — ABNORMAL HIGH (ref 65–99)

## 2014-12-14 LAB — CBC
HCT: 41 % (ref 36.0–46.0)
Hemoglobin: 13.6 g/dL (ref 12.0–15.0)
MCH: 31.9 pg (ref 26.0–34.0)
MCHC: 33.2 g/dL (ref 30.0–36.0)
MCV: 96.2 fL (ref 78.0–100.0)
PLATELETS: 214 10*3/uL (ref 150–400)
RBC: 4.26 MIL/uL (ref 3.87–5.11)
RDW: 13.2 % (ref 11.5–15.5)
WBC: 10.8 10*3/uL — AB (ref 4.0–10.5)

## 2014-12-14 LAB — MAGNESIUM: MAGNESIUM: 2.6 mg/dL — AB (ref 1.7–2.4)

## 2014-12-14 MED ORDER — LEVALBUTEROL HCL 0.63 MG/3ML IN NEBU: 0.6300 mg | INHALATION_SOLUTION | RESPIRATORY_TRACT | Status: DC | PRN

## 2014-12-14 MED ORDER — PREDNISONE 20 MG PO TABS: ORAL_TABLET | ORAL | Status: AC

## 2014-12-14 MED ORDER — LEVALBUTEROL HCL 0.63 MG/3ML IN NEBU
0.6300 mg | INHALATION_SOLUTION | Freq: Three times a day (TID) | RESPIRATORY_TRACT | Status: DC
  Administered 2014-12-14 – 2014-12-15 (×4): 0.63 mg via RESPIRATORY_TRACT
  Filled 2014-12-14 (×4): qty 3

## 2014-12-14 MED ORDER — DILTIAZEM HCL 60 MG PO TABS
60.0000 mg | ORAL_TABLET | Freq: Three times a day (TID) | ORAL | Status: DC
  Administered 2014-12-14 – 2014-12-16 (×5): 60 mg via ORAL
  Filled 2014-12-14 (×6): qty 1

## 2014-12-14 MED ORDER — POTASSIUM CHLORIDE CRYS ER 20 MEQ PO TBCR
40.0000 meq | EXTENDED_RELEASE_TABLET | Freq: Once | ORAL | Status: AC
  Administered 2014-12-14: 40 meq via ORAL
  Filled 2014-12-14: qty 2

## 2014-12-14 MED ORDER — ZOLPIDEM TARTRATE 5 MG PO TABS
5.0000 mg | ORAL_TABLET | Freq: Once | ORAL | Status: AC
  Administered 2014-12-14: 5 mg via ORAL
  Filled 2014-12-14: qty 1

## 2014-12-14 MED ORDER — LEVOFLOXACIN 750 MG PO TABS: 750.0000 mg | ORAL_TABLET | ORAL | Status: AC

## 2014-12-14 MED ORDER — DILTIAZEM HCL 25 MG/5ML IV SOLN
10.0000 mg | Freq: Once | INTRAVENOUS | Status: DC
  Filled 2014-12-14: qty 5

## 2014-12-14 MED ORDER — APIXABAN 5 MG PO TABS
5.0000 mg | ORAL_TABLET | Freq: Two times a day (BID) | ORAL | Status: DC
  Administered 2014-12-14 – 2014-12-16 (×4): 5 mg via ORAL
  Filled 2014-12-14 (×4): qty 1

## 2014-12-14 MED ORDER — PREDNISONE 20 MG PO TABS
40.0000 mg | ORAL_TABLET | Freq: Two times a day (BID) | ORAL | Status: DC
  Administered 2014-12-14 – 2014-12-16 (×5): 40 mg via ORAL
  Filled 2014-12-14 (×5): qty 2

## 2014-12-14 MED ORDER — IPRATROPIUM BROMIDE 0.02 % IN SOLN
0.5000 mg | Freq: Three times a day (TID) | RESPIRATORY_TRACT | Status: DC
  Administered 2014-12-14 – 2014-12-15 (×4): 0.5 mg via RESPIRATORY_TRACT
  Filled 2014-12-14 (×4): qty 2.5

## 2014-12-14 NOTE — Progress Notes (Signed)
Edd Fabian, Patient's daughter, at bedside wanting update on changes that occurred to patient's rhythm today. In discussing what had occurred, Edd Fabian states "Do you all have it in the system that patient is a do not resuscitate." At this time, patient is currently listed as a full code. Spoke with patient about her wishes, who states she does not want a breathing tube and does not want chest compressions should her heart stop beating. Patient is completely understandable of DNR measures. Dr. Maryland Pink made aware about patient's wishes.

## 2014-12-14 NOTE — Progress Notes (Signed)
Please saw incomplete note for detailed plan. The note is "incomplete" due to EPIC issue thus unable to sign.  Penny Curry, [place]

## 2014-12-14 NOTE — Progress Notes (Addendum)
[facility] PROGRESS NOTE  Penny Curry SEG:[id number] DOB: 14-Oct-1940 DOA: 12/09/2014  PCP: Ronita Hipps, MD  Brief HPI: 74 year old Caucasian female with a past medical history of hypertension, diabetes, COPD and pulmonary fibrosis who is glucocorticoid dependent and on home oxygen, presented with two-week history of progressively worsening shortness of breath. Patient was noted to have acute COPD exacerbation. She was hospitalized for further management. Patient was treated with nebulizer treatments, steroids and antibiotics. She started improving. On the day of discharge, patient went into rapid atrial fibrillation. Discharge was canceled.  Past medical history:  Past Medical History  Diagnosis Date  . HTN (hypertension)   . Diabetes mellitus   . Fibrosis of lung ([place])   . CHF (congestive heart failure) ([place])   . GERD (gastroesophageal reflux disease)   . Chronic headache     migraines  . UTI (urinary tract infection)     " MULTIPLE TIMES THIS PAST YEAR "  . Arthritis   . Anemia   . Cataract   . COPD (chronic obstructive pulmonary disease) ([place])   . Depression   . Osteoporosis   . Hyperlipidemia     Consultants: None  Procedures: None  Antibiotics: Levaquin  Subjective: Called by the nurse after patient was noted to have an elevated heart rate. She was complaining of palpitations. She complains of some tightness in her chest. Feels a little nauseated. She thinks she may have had atrial fibrillation in the past, but is not certain.   Objective: Vital Signs  Filed Vitals:   12/13/14 2128 12/14/14 0352 12/14/14 0825 12/14/14 0952  BP:  130/73  114/74  Pulse:  81  135  Temp:  97.8 F (36.6 C)    TempSrc:  Oral    Resp:  18    Height:      Weight:  84.251 kg (185 lb 11.8 oz)    SpO2: 95% 98% 97% 93%    Intake/Output Summary (Last 24 hours) at 12/14/14 1015 Last data filed at 12/14/14 0915  Gross per 24 hour  Intake    840 ml  Output   3350 ml    Net  -2510 ml   Filed Weights   12/12/14 0443 12/13/14 0536 12/14/14 0352  Weight: 86.274 kg (190 lb 3.2 oz) 84.46 kg (186 lb 3.2 oz) 84.251 kg (185 lb 11.8 oz)   Telemetry did show atrial fibrillation in the 130s. Subsequently converted back to sinus rhythm.  General appearance: alert, cooperative, appears stated age, no distress and moderately obese Resp: Improved air entry bilaterally. Scattered wheezes. No crackles or rhonchi.  Cardio: regular rate and rhythm, S1, S2 normal, no murmur, click, rub or gallop GI: soft, non-tender; bowel sounds normal; no masses,  no organomegaly Neurologic: Alert and oriented 3. No focal neurological deficits are noted.  Lab Results:  Basic Metabolic Panel:  Recent Labs Lab 12/09/14 1740 12/10/14 0240 12/11/14 0300  NA 129* 131* 132*  K 4.4 4.0 4.3  CL 84* 87* 93*  CO2 32 32 29  GLUCOSE 309* 274* 268*  BUN 32* 33* 41*  CREATININE 1.51* 1.39* 1.36*  CALCIUM 9.8 9.5 9.7   Liver Function Tests:  Recent Labs Lab 12/10/14 0240  AST 26  ALT 33  ALKPHOS 63  BILITOT 0.5  PROT 6.6  ALBUMIN 3.4*   CBC:  Recent Labs Lab 12/09/14 1740 12/10/14 0240 12/11/14 0300  WBC 12.6* 11.1* 12.5*  NEUTROABS 9.9* 7.1  --   HGB 12.2 11.5* 11.7*  HCT 36.2  34.0* 35.5*  MCV 96.0 96.0 98.1  PLT 232 200 194   BNP (last 3 results)  Recent Labs  09/06/14 2150 10/22/14 1200 12/09/14 1930  BNP 127.2* 107.8* 93.1     CBG:  Recent Labs Lab 12/13/14 0553 12/13/14 1141 12/13/14 1600 12/13/14 2140 12/14/14 0631  GLUCAP 236* 362* 343* 163* 232*    No results found for this or any previous visit (from the past 240 hour(s)).    Studies/Results: No results found.  Medications:  Scheduled: . aspirin  81 mg Oral Daily  . citalopram  40 mg Oral QHS  . colesevelam  625 mg Oral Q supper  . diltiazem  60 mg Oral 3 times per day  . divalproex  1,000 mg Oral QHS  . enoxaparin (LOVENOX) injection  40 mg Subcutaneous Q24H  . fluticasone   2 spray Each Nare Daily  . gabapentin  300 mg Oral QHS  . guaiFENesin  600 mg Oral BID  . insulin aspart  0-20 Units Subcutaneous TID WC  . insulin aspart  0-5 Units Subcutaneous QHS  . insulin aspart  8 Units Subcutaneous TID WC  . insulin detemir  50 Units Subcutaneous Q breakfast  . ipratropium  0.5 mg Nebulization 3 times per day  . levalbuterol  0.63 mg Nebulization Q8H  . levofloxacin  750 mg Oral Q48H  . pantoprazole  40 mg Oral Daily  . potassium chloride  20 mEq Oral Daily  . predniSONE  40 mg Oral BID WC  . rOPINIRole  3 mg Oral BID  . sodium chloride  3 mL Intravenous Q12H  . sodium chloride  3 mL Intravenous Q12H  . torsemide  50 mg Oral BID  . traMADol  100 mg Oral QHS  . traMADol  50 mg Oral Daily  . Vitamin D (Ergocalciferol)  50,000 Units Oral Q Fri   Continuous:  JSH:FWYOVZ chloride, acetaminophen, benzonatate, levalbuterol, loratadine, ondansetron **OR** ondansetron (ZOFRAN) IV, sodium chloride  Assessment/Plan:  Principal Problem:   COPD exacerbation (HCC) Active Problems:   Diabetes mellitus type 2, controlled ([place])   Essential hypertension   Pulmonary hypertension (HCC)   Dehydration with hyponatremia   Acute bronchitis    Paroxysmal atrial fibrillation Atrial fibrillation, likely triggered by her lung disease. She does have a history of pulmonary hypertension. She has pulmonary fibrosis and COPD. She has spontaneously converted to sinus rhythm. Obtain CBC, basic metabolic panel, chest x-ray, troponin. Repeat EKG does not show any ischemic changes. Unable to use beta blocker due to significant wheezing that she was experiencing recently. Initiate Cardizem for now. Consult cardiology. Echocardiogram from September showed normal EF with grade 1 diastolic dysfunction. Mild aortic stenosis noted. Chads 2 Vasc score appears to be 4. Defer anticoagulation to cardiology.  Acute COPD exacerbation with acute respiratory failure Patient is much better. Continue  oral steroids. Change albuterol to xopenex considering A. fib. Continue oral antibiotics. Patient is on home oxygen. Continue O2. Patient has a long-standing history of COPD along with pulmonary fibrosis. She is on chronic steroids.  Diabetes mellitus type 2, uncontrolled CBGs are elevated, most likely secondary to steroids. Dose of Levemir was increased. Meal coverage was added. Continue resistance sliding scale coverage as well. HbA1c was 7.8 in May. CBGs should improve as steroid is tapered down.  History of essential hypertension Continue to monitor blood pressures closely. Continue her home medications.  History of pulmonary hypertension Stable. Continue diuretics. **Blood work was reviewed. Creatinine and BUN noted to be significantly higher than  what it was couple days ago. Could be due to over diuresis. Torsemide has been held by cardiology. Repeat renal function tomorrow morning.  Mild dehydration with hyponatremia Improved with hydration. Continued to monitor.  History of depression Continue her psychotropic agents.   DVT Prophylaxis: Lovenox    Code Status: Full code **SEE BELOW** Family Communication: Discussed with the patient and her granddaughter Disposition Plan: Cancel discharge due to paroxysmal atrial fibrillation. Await cardiology input. Await blood work. Monitor for additional 24 hours.  ADDENDUM Called by RN that patient wants to be DNR as per her known wishes. Will change code status.   LOS: 4 days   [facility] Pager [phone] 12/14/2014, 10:15 AM  If 7PM-7AM, please contact night-coverage at [email or web address], password [facility]

## 2014-12-14 NOTE — Progress Notes (Signed)
Dr. Maryland Pink at bedside speaking with patient. Patient has converted back to NSR with HR 90's

## 2014-12-14 NOTE — Progress Notes (Addendum)
Patient had discharge orders. Went to take patient of telemetry and realized patient's HR was 130-140 with irregular rhythm. Patient states she feels like her heart is racing. Patient denies chest pain, dizziness, fatigue. Patient states she has a history of irregular heart rhythm, but cannot state when she had it last. EKG states  Atrial fib with RVR. Other than HR, VSS. Dr. Maryland Pink made aware. New orders placed and MD to come see patient at bedside.

## 2014-12-14 NOTE — Progress Notes (Addendum)
ANTICOAGULATION CONSULT NOTE - Initial Consult  Pharmacy Consult for apixaban Indication: atrial fibrillation  Allergies: clindamycin, PCN, tape  Patient Measurements: Height: 5\' 5"  (165.1 cm) Weight: 185 lb 11.8 oz (84.251 kg) (scale A) IBW/kg (Calculated) : 57  Vital Signs: Temp: 98 F (36.7 C) (10/25 1214) Temp Source: Oral (10/25 1214) BP: 116/68 mmHg (10/25 1214) Pulse Rate: 84 (10/25 1214)  Labs:  Recent Labs  12/14/14 1057  HGB 13.6  HCT 41.0  PLT 214  CREATININE 1.90*  TROPONINI 0.03    Estimated Creatinine Clearance: 28.3 mL/min (by C-G formula based on Cr of 1.9).   Medical History: Past Medical History  Diagnosis Date  . HTN (hypertension)   . Diabetes mellitus   . Fibrosis of lung ([place])   . CHF (congestive heart failure) ([place])   . GERD (gastroesophageal reflux disease)   . Chronic headache     migraines  . UTI (urinary tract infection)     " MULTIPLE TIMES THIS PAST YEAR "  . Arthritis   . Anemia   . Cataract   . COPD (chronic obstructive pulmonary disease) ([place])   . Depression   . Osteoporosis   . Hyperlipidemia     Medications:  Prescriptions prior to admission  Medication Sig Dispense Refill Last Dose  . albuterol (PROVENTIL) (2.5 MG/3ML) 0.083% nebulizer solution Take 3 mLs (2.5 mg total) by nebulization 3 (three) times daily. 270 mL 11 12/08/2014 at Unknown time  . aspirin 81 MG chewable tablet Chew 1 tablet (81 mg total) by mouth daily. 30 tablet 0 12/09/2014 at Unknown time  . Benzonatate (TESSALON PO) Take 1 capsule by mouth daily as needed (cough).   12/08/2014 at Unknown time  . citalopram (CELEXA) 40 MG tablet Take 40 mg by mouth at bedtime.    12/08/2014 at Unknown time  . Colesevelam HCl 3.75 G PACK Take 1 packet by mouth every evening.   12/08/2014 at Unknown time  . divalproex (DEPAKOTE ER) 500 MG 24 hr tablet Take 1,000 mg by mouth at bedtime.   12/08/2014 at Unknown time  . ergocalciferol (VITAMIN D2) 50000 UNITS capsule  Take 50,000 Units by mouth every Friday.   12/03/2014  . fluticasone (FLONASE) 50 MCG/ACT nasal spray Use 2 sprays in each  nostril daily (Patient taking differently: Use 2 sprays in each  nostril twice daily) 48 g 1 12/09/2014 at Unknown time  . gabapentin (NEURONTIN) 300 MG capsule Take 300 mg by mouth at bedtime.   12/08/2014 at Unknown time  . HUMALOG KWIKPEN 100 UNIT/ML KiwkPen Inject 11 Units into the skin 3 (three) times daily.    12/09/2014 at Unknown time  . LEVEMIR FLEXTOUCH 100 UNIT/ML Pen Inject 40 Units into the skin daily with breakfast.    12/09/2014 at Unknown time  . loratadine (CLARITIN) 10 MG tablet Take 1 tablet (10 mg total) by mouth daily. (Patient taking differently: Take 10 mg by mouth daily as needed for allergies or rhinitis. ) 30 tablet 11 over 30 days  . metolazone (ZAROXOLYN) 2.5 MG tablet Take 1 tablet (2.5 mg total) by mouth as needed (for weight 203 or greater). (Patient taking differently: Take 2.5 mg by mouth as needed (for weight 195 or greater). ) 20 tablet 3 12/09/2014 at Unknown time  . ondansetron (ZOFRAN-ODT) 8 MG disintegrating tablet Take 8 mg by mouth every 6 (six) hours as needed for nausea or vomiting.    Past Week at Unknown time  . OXYGEN Inhale 3 L into the lungs  continuous.   cont.  . pantoprazole (PROTONIX) 40 MG tablet Take 40 mg by mouth daily.    12/09/2014 at Unknown time  . potassium chloride (KLOR-CON) 10 MEQ CR tablet Take 20 mEq by mouth daily before breakfast.    12/09/2014 at Unknown time  . PROAIR HFA 108 (90 BASE) MCG/ACT inhaler Use 2 puffs every 6 hours  as needed for wheezing or  shortness of breath 3 Inhaler 1 12/09/2014 at Unknown time  . rOPINIRole (REQUIP) 3 MG tablet Take 3 mg by mouth 2 (two) times daily.     12/09/2014 at Unknown time  . spironolactone (ALDACTONE) 25 MG tablet Take 25 mg by mouth 2 (two) times daily.    12/09/2014 at Unknown time  . STIOLTO RESPIMAT 2.5-2.5 MCG/ACT AERS Inhale 2 puffs into the  lungs daily 12 g 1  12/09/2014 at Unknown time  . tetrahydrozoline-zinc (VISINE-AC) 0.05-0.25 % ophthalmic solution Place 1 drop into both eyes daily as needed (dry eyes).   Past Week at Unknown time  . torsemide (DEMADEX) 100 MG tablet Take 50 mg by mouth 2 (two) times daily.   12/09/2014 at Unknown time  . traMADol (ULTRAM) 50 MG tablet Take 50-100 mg by mouth 2 (two) times daily. Takes 1 tablet in the morning and 2 tablets at night   12/08/2014 at Unknown time  . [DISCONTINUED] predniSONE (DELTASONE) 20 MG tablet Take 1 tablet (20 mg total) by mouth daily with breakfast. 30 tablet 1 12/09/2014 at Unknown time    Assessment: 74 y/o female who presented with worsening SOB and was treated for a COPD exacerbation. She was supposed to go home today but went into rapid Afib. Pharmacy consulted to begin apixaban. Spoke with Dr. Harrington Challenger about borderline renal function and possibility of needing to transition to warfarin at a future time. Also spoke with patient about option of apixaban vs warfarin. She would like to try apixaban but knows she may need to transition to warfarin in the future. Case management also verifying cost to patient for apixaban.  SCr has ranged from 1.36 to 1.9 this admit. Est CrCl based on TBW is 34-48 ml/min. She is <81 y/o, >60 kg, and SCr >1.5 today. She qualifies for normal dosing of apixaban. No bleeding noted, CBC is normal. CHADS2VASC is 25 (age, female, HTN, CHF, DM).  Goal of Therapy:  stroke and clot prevention Monitor platelets by anticoagulation protocol: Yes   Plan:  - Discontinue enoxaparin - Apixaban 5 mg PO bid - Monitor renal function and s/sx of bleeding - Case management verifying cost of medication to patient  [facility], Pharm.D., BCPS Clinical Pharmacist Pager: [phone] 12/14/2014 1:54 PM

## 2014-12-14 NOTE — Discharge Instructions (Signed)
Chronic Obstructive Pulmonary Disease Chronic obstructive pulmonary disease (COPD) is a common lung condition in which airflow from the lungs is limited. COPD is a general term that can be used to describe many different lung problems that limit airflow, including both chronic bronchitis and emphysema. If you have COPD, your lung function will probably never return to normal, but there are measures you can take to improve lung function and make yourself feel better. CAUSES   Smoking (common).  Exposure to secondhand smoke.  Genetic problems.  Chronic inflammatory lung diseases or recurrent infections. SYMPTOMS  Shortness of breath, especially with physical activity.  Deep, persistent (chronic) cough with a large amount of thick mucus.  Wheezing.  Rapid breaths (tachypnea).  Gray or bluish discoloration (cyanosis) of the skin, especially in your fingers, toes, or lips.  Fatigue.  Weight loss.  Frequent infections or episodes when breathing symptoms become much worse (exacerbations).  Chest tightness. DIAGNOSIS Your health care provider will take a medical history and perform a physical examination to diagnose COPD. Additional tests for COPD may include:  Lung (pulmonary) function tests.  Chest X-ray.  CT scan.  Blood tests. TREATMENT  Treatment for COPD may include:  Inhaler and nebulizer medicines. These help manage the symptoms of COPD and make your breathing more comfortable.  Supplemental oxygen. Supplemental oxygen is only helpful if you have a low oxygen level in your blood.  Exercise and physical activity. These are beneficial for nearly all people with COPD.  Lung surgery or transplant.  Nutrition therapy to gain weight, if you are underweight.  Pulmonary rehabilitation. This may involve working with a team of health care providers and specialists, such as respiratory, occupational, and physical therapists. HOME CARE INSTRUCTIONS  Take all medicines  (inhaled or pills) as directed by your health care provider.  Avoid over-the-counter medicines or cough syrups that dry up your airway (such as antihistamines) and slow down the elimination of secretions unless instructed otherwise by your health care provider.  If you are a smoker, the most important thing that you can do is stop smoking. Continuing to smoke will cause further lung damage and breathing trouble. Ask your health care provider for help with quitting smoking. He or she can direct you to community resources or hospitals that provide support.  Avoid exposure to irritants such as smoke, chemicals, and fumes that aggravate your breathing.  Use oxygen therapy and pulmonary rehabilitation if directed by your health care provider. If you require home oxygen therapy, ask your health care provider whether you should purchase a pulse oximeter to measure your oxygen level at home.  Avoid contact with individuals who have a contagious illness.  Avoid extreme temperature and humidity changes.  Eat healthy foods. Eating smaller, more frequent meals and resting before meals may help you maintain your strength.  Stay active, but balance activity with periods of rest. Exercise and physical activity will help you maintain your ability to do things you want to do.  Preventing infection and hospitalization is very important when you have COPD. Make sure to receive all the vaccines your health care provider recommends, especially the pneumococcal and influenza vaccines. Ask your health care provider whether you need a pneumonia vaccine.  Learn and use relaxation techniques to manage stress.  Learn and use controlled breathing techniques as directed by your health care provider. Controlled breathing techniques include:  Pursed lip breathing. Start by breathing in (inhaling) through your nose for 1 second. Then, purse your lips as if you were  going to whistle and breathe out (exhale) through the  pursed lips for 2 seconds.  Diaphragmatic breathing. Start by putting one hand on your abdomen just above your waist. Inhale slowly through your nose. The hand on your abdomen should move out. Then purse your lips and exhale slowly. You should be able to feel the hand on your abdomen moving in as you exhale.  Learn and use controlled coughing to clear mucus from your lungs. Controlled coughing is a series of short, progressive coughs. The steps of controlled coughing are: 1. Lean your head slightly forward. 2. Breathe in deeply using diaphragmatic breathing. 3. Try to hold your breath for 3 seconds. 4. Keep your mouth slightly open while coughing twice. 5. Spit any mucus out into a tissue. 6. Rest and repeat the steps once or twice as needed. SEEK MEDICAL CARE IF:  You are coughing up more mucus than usual.  There is a change in the color or thickness of your mucus.  Your breathing is more labored than usual.  Your breathing is faster than usual. SEEK IMMEDIATE MEDICAL CARE IF:  You have shortness of breath while you are resting.  You have shortness of breath that prevents you from:  Being able to talk.  Performing your usual physical activities.  You have chest pain lasting longer than 5 minutes.  Your skin color is more cyanotic than usual.  You measure low oxygen saturations for longer than 5 minutes with a pulse oximeter. MAKE SURE YOU:  Understand these instructions.  Will watch your condition.  Will get help right away if you are not doing well or get worse.   This information is not intended to replace advice given to you by your health care provider. Make sure you discuss any questions you have with your health care provider.   Document Released: 11/15/2004 Document Revised: 02/26/2014 Document Reviewed: 10/02/2012 Elsevier Interactive Patient Education 2016 [place] on my medicine - ELIQUIS (apixaban)  This medication education was  reviewed with me or my healthcare representative as part of my discharge preparation.  The pharmacist that spoke with me during my hospital stay was:  [facility], Margot Chimes, [facility]  Why was Eliquis prescribed for you? Eliquis was prescribed for you to reduce the risk of a blood clot forming that can cause a stroke if you have a medical condition called atrial fibrillation (a type of irregular heartbeat).  What do You need to know about Eliquis ? Take your Eliquis TWICE DAILY - one tablet in the morning and one tablet in the evening with or without food. If you have difficulty swallowing the tablet whole please discuss with your pharmacist how to take the medication safely.  Take Eliquis exactly as prescribed by your doctor and DO NOT stop taking Eliquis without talking to the doctor who prescribed the medication.  Stopping may increase your risk of developing a stroke.  Refill your prescription before you run out.  After discharge, you should have regular check-up appointments with your healthcare provider that is prescribing your Eliquis.  In the future your dose may need to be changed if your kidney function or weight changes by a significant amount or as you get older.  What do you do if you miss a dose? If you miss a dose, take it as soon as you remember on the same day and resume taking twice daily.  Do not take more than one dose of ELIQUIS at the same time to make up a  missed dose.  Important Safety Information A possible side effect of Eliquis is bleeding. You should call your healthcare provider right away if you experience any of the following: ? Bleeding from an injury or your nose that does not stop. ? Unusual colored urine (red or dark brown) or unusual colored stools (red or black). ? Unusual bruising for unknown reasons. ? A serious fall or if you hit your head (even if there is no bleeding).  Some medicines may interact with Eliquis and might increase your risk of  bleeding or clotting while on Eliquis. To help avoid this, consult your healthcare provider or pharmacist prior to using any new prescription or non-prescription medications, including herbals, vitamins, non-steroidal anti-inflammatory drugs (NSAIDs) and supplements.  This website has more information on Eliquis (apixaban): http://www.eliquis.com/eliquis/home

## 2014-12-14 NOTE — Progress Notes (Signed)
RE: Benefit check  For Eliquis     Nia A Shealy CMA           Authorization is required- co pay is $95; [id number] )

## 2014-12-14 NOTE — Consult Note (Signed)
CARDIOLOGY CONSULT NOTE   Patient ID: Penny Curry MRN: [id number] DOB/AGE: Jul 14, 1940 74 y.o.  Admit date: 12/09/2014  Primary Physician   Ronita Hipps, MD Primary Cardiologist  Jolaine Artist, MD Reason for Consultation   Afib with RVR   HPI: Penny Curry is a 74 y.o. female with previous hx tobacco use (quit 1969), DM2, HTN, obesity, HL, diastolic HF, pulmonary sarcoid with resultant ILD with chronic respiratory failure on home O2 who admitted 12/10/14 with COPD exacerbation and cardiology consulted today for afib with RVR.   Underwent FOB and Bx in 2005, treated with pred and cytoxan in the past. Then had nodules on arm that were bx and showed probable sarcoidosis Summer 2011 ([facility]).  She was hospitalized at   [facility]  for diastolic HF (had echo at [place] with reportedly normal EF) and then again 7/18-7/20 at [address] [facility] for CP and volume overload. Myoview 08/2014 normal with EF of 66%. Again admitted 9/2-05/2014 with cellulitis.   Seen by Dr. Haroldine Laws 11/04/14 for CHF and PAH. At that time her dry weight seems to be 197-200. She was advice to take metolazone 2.5 mg with kcl 20 for weight of 203 or greater.  If symptoms get worse or echo suggests RV strain can proceed with RHC.  F/u echo 04/13/14 showed LV EF of 55-60%; grade 1 DD; mildLAE; mild AS with mean gradient 14 mmHg and mild AI; mild PS; trace MR and TR. Normal RV function.   She was treated with Rocephine inj and oral clindamycin by PCP for pneumonia at beginning of Oct, 2016. However, her symptoms progressively worsen with cough, pleuritic chest pain, dyspnea, wheezing associated with fever, chills and fatigue.   She was admitted 12/10/14 for acute COPD exacerbation and treated with nebulizer treatments, steroids and antibiotics. Today she went into afib with RVR prior to discharge for brief period and cardiology consulted for further evaluation. She has spontaneously converted to sinus rhythm without  intervention. She did felt palpitation with chest tightness when she was in afib.   EKG with afib with RVR at rate of 137bpm. Scr of 1.9 which increased from 1.36 on 12/11/14. BUN of 89. K of 3.4. Mg of 2.6. Troponin I negative. Blood glucose of 306. CXR No acute infiltrate or pulmonary edema. Mild perihilar bronchitic changes.  Past Medical History  Diagnosis Date  . HTN (hypertension)   . Diabetes mellitus   . Fibrosis of lung ([place])   . CHF (congestive heart failure) ([place])   . GERD (gastroesophageal reflux disease)   . Chronic headache     migraines  . UTI (urinary tract infection)     " MULTIPLE TIMES THIS PAST YEAR "  . Arthritis   . Anemia   . Cataract   . COPD (chronic obstructive pulmonary disease) ([place])   . Depression   . Osteoporosis   . Hyperlipidemia      Past Surgical History  Procedure Laterality Date  . Neck surgery  1987  . Partial hysterectomy    . Tubal ligation    . Cataract extraction    . Abdominal hysterectomy     Alllergies: Clindamycin/Lincomycin - Swelling and rash Penicillins - Swelling and rash  I have reviewed the patient's current medications . aspirin  81 mg Oral Daily  . citalopram  40 mg Oral QHS  . colesevelam  625 mg Oral Q supper  . diltiazem  60 mg Oral 3 times per day  . divalproex  1,000 mg Oral QHS  .  enoxaparin (LOVENOX) injection  40 mg Subcutaneous Q24H  . fluticasone  2 spray Each Nare Daily  . gabapentin  300 mg Oral QHS  . guaiFENesin  600 mg Oral BID  . insulin aspart  0-20 Units Subcutaneous TID WC  . insulin aspart  0-5 Units Subcutaneous QHS  . insulin aspart  8 Units Subcutaneous TID WC  . insulin detemir  50 Units Subcutaneous Q breakfast  . ipratropium  0.5 mg Nebulization 3 times per day  . levalbuterol  0.63 mg Nebulization Q8H  . levofloxacin  750 mg Oral Q48H  . pantoprazole  40 mg Oral Daily  . potassium chloride  20 mEq Oral Daily  . predniSONE  40 mg Oral BID WC  . rOPINIRole  3 mg Oral BID  . sodium  chloride  3 mL Intravenous Q12H  . sodium chloride  3 mL Intravenous Q12H  . torsemide  50 mg Oral BID  . traMADol  100 mg Oral QHS  . traMADol  50 mg Oral Daily  . Vitamin D (Ergocalciferol)  50,000 Units Oral Q Fri     sodium chloride, acetaminophen, benzonatate, levalbuterol, loratadine, ondansetron **OR** ondansetron (ZOFRAN) IV, sodium chloride  Prior to Admission medications   Medication Sig Start Date End Date Taking? Authorizing Provider  albuterol (PROVENTIL) (2.5 MG/3ML) 0.083% nebulizer solution Take 3 mLs (2.5 mg total) by nebulization 3 (three) times daily. 09/14/14  Yes Collene Gobble, MD  aspirin 81 MG chewable tablet Chew 1 tablet (81 mg total) by mouth daily. 09/07/14  Yes Lucious Groves, DO  Benzonatate (TESSALON PO) Take 1 capsule by mouth daily as needed (cough).   Yes Historical Provider, MD  citalopram (CELEXA) 40 MG tablet Take 40 mg by mouth at bedtime.    Yes Historical Provider, MD  Colesevelam HCl 3.75 G PACK Take 1 packet by mouth every evening.   Yes Historical Provider, MD  divalproex (DEPAKOTE ER) 500 MG 24 hr tablet Take 1,000 mg by mouth at bedtime.   Yes Historical Provider, MD  ergocalciferol (VITAMIN D2) 50000 UNITS capsule Take 50,000 Units by mouth every Friday.   Yes Historical Provider, MD  fluticasone (FLONASE) 50 MCG/ACT nasal spray Use 2 sprays in each  nostril daily Patient taking differently: Use 2 sprays in each  nostril twice daily 11/22/14  Yes Collene Gobble, MD  gabapentin (NEURONTIN) 300 MG capsule Take 300 mg by mouth at bedtime.   Yes Historical Provider, MD  HUMALOG KWIKPEN 100 UNIT/ML KiwkPen Inject 11 Units into the skin 3 (three) times daily.  08/09/14  Yes Historical Provider, MD  LEVEMIR FLEXTOUCH 100 UNIT/ML Pen Inject 40 Units into the skin daily with breakfast.  08/09/14  Yes Historical Provider, MD  loratadine (CLARITIN) 10 MG tablet Take 1 tablet (10 mg total) by mouth daily. Patient taking differently: Take 10 mg by mouth daily as  needed for allergies or rhinitis.  02/05/14  Yes Collene Gobble, MD  metolazone (ZAROXOLYN) 2.5 MG tablet Take 1 tablet (2.5 mg total) by mouth as needed (for weight 203 or greater). Patient taking differently: Take 2.5 mg by mouth as needed (for weight 195 or greater).  11/04/14  Yes Shaune Pascal Bensimhon, MD  ondansetron (ZOFRAN-ODT) 8 MG disintegrating tablet Take 8 mg by mouth every 6 (six) hours as needed for nausea or vomiting.  11/26/14  Yes Historical Provider, MD  OXYGEN Inhale 3 L into the lungs continuous.   Yes Historical Provider, MD  pantoprazole (PROTONIX) 40 MG tablet  Take 40 mg by mouth daily.    Yes Historical Provider, MD  potassium chloride (KLOR-CON) 10 MEQ CR tablet Take 20 mEq by mouth daily before breakfast.    Yes Historical Provider, MD  PROAIR HFA 108 (90 BASE) MCG/ACT inhaler Use 2 puffs every 6 hours  as needed for wheezing or  shortness of breath 08/09/14  Yes Collene Gobble, MD  rOPINIRole (REQUIP) 3 MG tablet Take 3 mg by mouth 2 (two) times daily.     Yes Historical Provider, MD  spironolactone (ALDACTONE) 25 MG tablet Take 25 mg by mouth 2 (two) times daily.  11/12/13  Yes Historical Provider, MD  STIOLTO RESPIMAT 2.5-2.5 MCG/ACT AERS Inhale 2 puffs into the  lungs daily 11/22/14  Yes Collene Gobble, MD  tetrahydrozoline-zinc (VISINE-AC) 0.05-0.25 % ophthalmic solution Place 1 drop into both eyes daily as needed (dry eyes).   Yes Historical Provider, MD  torsemide (DEMADEX) 100 MG tablet Take 50 mg by mouth 2 (two) times daily. 10/04/14  Yes Historical Provider, MD  traMADol (ULTRAM) 50 MG tablet Take 50-100 mg by mouth 2 (two) times daily. Takes 1 tablet in the morning and 2 tablets at night 05/23/11  Yes Tanda Rockers, MD  levofloxacin (LEVAQUIN) 750 MG tablet Take 1 tablet (750 mg total) by mouth every other day. Starting 12/16/14 for 3 more doses 12/14/14   Bonnielee Haff, MD  predniSONE (DELTASONE) 20 MG tablet Take 2 tablets twice daily for 5 days, then take 2 tablets  once daily for 5 days and then one tablet once daily as before. 12/14/14   Bonnielee Haff, MD     Social History   Social History  . Marital Status: Widowed    Spouse Name: N/A  . Number of Children: 4  . Years of Education: N/A   Occupational History  . disabled    Social History Main Topics  . Smoking status: Former Smoker -- 1.00 packs/day for 8 years    Types: Cigarettes    Quit date: 02/20/1968  . Smokeless tobacco: Never Used  . Alcohol Use: No  . Drug Use: No  . Sexual Activity: Not Currently   Other Topics Concern  . Not on file   Social History Narrative    Family Status  Relation Status Death Age  . Brother Alive   . Mother Deceased   . Father Deceased   . Sister Deceased   . Maternal Grandmother Deceased   . Maternal Grandfather Deceased   . Paternal Grandmother Deceased   . Paternal Grandfather Deceased   . Brother Alive   . Sister Alive    Family History  Problem Relation Age of Onset  . Prostate cancer Brother   . Heart disease Brother     MI at age 87  . Stroke Mother     stroke at age 41  . Diabetes Mother   . Asthma Son   . Emphysema Father   . Heart disease Father   . Lung cancer Sister   . Heart disease Sister     MI at 58  . Heart disease Brother     all three brothers have had MI     ROS:  Full 14 point review of systems complete and found to be negative unless listed above.  Physical Exam: Blood pressure 116/68, pulse 84, temperature 98 F (36.7 C), temperature source Oral, resp. rate 18, height 5\' 5"  (1.651 m), weight 185 lb 11.8 oz (84.251 kg), SpO2 96 %.  General: Well developed, obese  female in no acute distress Head: Eyes PERRLA, No xanthomas. Normocephalic and atraumatic, oropharynx without edema or exudate.  Lungs: Resp regular and unlabored. Diminished breath sound with scattered expiratory wheezing.  Mild  No rales    Heart: RRR no s3, s4, or murmurs.   Neck: No carotid bruits. No lymphadenopathy. No JVD. Abdomen:  Bowel sounds present, abdomen soft and non-tender without masses or hernias noted. Msk:  No spine or cva tenderness. No weakness, no joint deformities or effusions. Extremities: No clubbing, cyanosis or edema. DP/PT/Radials 2+ and equal bilaterally. Neuro: Alert and oriented X 3. No focal deficits noted. Psych:  Good affect, responds appropriately Skin: No rashes or lesions noted.  Labs:   Lab Results  Component Value Date   WBC 10.8* 12/14/2014   HGB 13.6 12/14/2014   HCT 41.0 12/14/2014   MCV 96.2 12/14/2014   PLT 214 12/14/2014   No results for input(s): INR in the last 72 hours.  Recent Labs Lab 12/10/14 0240  12/14/14 1057  NA 131*  < > 136  K 4.0  < > 3.4*  CL 87*  < > 80*  CO2 32  < > 37*  BUN 33*  < > 89*  CREATININE 1.39*  < > 1.90*  CALCIUM 9.5  < > 10.1  PROT 6.6  --   --   BILITOT 0.5  --   --   ALKPHOS 63  --   --   ALT 33  --   --   AST 26  --   --   GLUCOSE 274*  < > 306*  ALBUMIN 3.4*  --   --   < > = values in this interval not displayed. MAGNESIUM  Date Value Ref Range Status  12/14/2014 2.6* 1.7 - 2.4 mg/dL Final    Recent Labs  12/14/14 1057  TROPONINI 0.03    Echo: 11/12/14 LV EF: 55% -  60%  ------------------------------------------------------------------- Indications:   Pulmonary hypertension (I27.0).  ------------------------------------------------------------------- History:  PMH:  Congestive heart failure. Risk factors: Cor pulmonale. Interstitial lung disease. Asthma. Acute-on-chronic respiratory failure. Dysphagia. Encephalopathy. CKD stage 2. Sarcoidosis. Dehydration. Anemia. Cellulitis. Current tobacco use. Hypertension. Diabetes mellitus.  ------------------------------------------------------------------- Study Conclusions  - Left ventricle: The cavity size was normal. Wall thickness was increased in a pattern of mild LVH. Systolic function was normal. The estimated ejection fraction was in the range of  55% to 60%. Wall motion was normal; there were no regional wall motion abnormalities. Doppler parameters are consistent with abnormal left ventricular relaxation (grade 1 diastolic dysfunction). - Aortic valve: There was mild stenosis. There was mild regurgitation. - Mitral valve: Calcified annulus. Mildly thickened leaflets . - Left atrium: The atrium was mildly dilated. - Pulmonic valve: The findings are consistent with mild stenosis.  Impressions:  - Normal LV systolic function; grade 1 diastolic dysfunction; mild LAE; mild AS with mean gradient 14 mmHg and mild AI; mild PS; trace MR and TR.  ECG:    Atrial fib 137.   ST depression (downsloping 1-2 mm)  Prolonged QT SR 95  Sagging of ST segments I, II, AVF, V4 to V6) Radiology:  Dg Chest Port 1 View  12/14/2014  CLINICAL DATA:  Chest pain, shortness of breath, productive cough EXAM: PORTABLE CHEST 1 VIEW COMPARISON:  12/09/2014 FINDINGS: Cardiomediastinal silhouette is stable. No acute infiltrate or pleural effusion. No pulmonary edema. Stable chronic mild interstitial prominence. Mild perihilar bronchitic changes. IMPRESSION: No acute infiltrate or pulmonary edema. Mild  perihilar bronchitic changes. Electronically Signed   By: Lahoma Crocker M.D.   On: 12/14/2014 11:07    ASSESSMENT AND PLAN:     1. New onset afib with RVR - in setting of respiratory illness. Went into afib with RVR from brief period then converted to sinus rhythm spontaneously with out medical intervention.  - CHADSVASCs score of at least 5 (age, sex, CHF, HTN, DM). Will start Eliquis for anticoagulation. She was placed on cardizem 60mg  TID per IM. Continue Current dose of cardizem for rate controlled.  - Not on BB due to respiratory illness.  - Echo 04/13/14 showed LV EF of 55-60%; grade 1 DD; mildLAE; mild AS with mean gradient 14 mmHg and mild AI; mild PS; trace MR and TR. Normal RV function.  Should be on oral anticoag  WOuld consult pharm for coumadin   (She is on margin with Eliquis)  EKG with ST depression  I would not pursue ischemic eval given other med issues, baseline EKG and no CP.     2. Chronic diastolic CHF - Seen by Dr. Haroldine Laws 11/04/14 for CHF and PAH. At that time her dry weight seems to be 197-200. She was advice to take metolazone 2.5 mg with kcl 20 for weight of 203 or greater.  - She is also on daily dose of Torsemid 50mg  BID. Scr of 1.9 which increased from 1.36 on 12/11/14. BUN of 89. She looks over dry.  She diuresed 9L since admission and weight down 7lb (192-185lb). Hold torsemid until stable renal function. Will also hold supplement potassium. However will once dose of Kdur 86meq today due to recent K of 3.4.  -She was on Spironolactone 25mg  BID at home that was held during admission.   3. AKI - As above  4. HTN - Stable and well controlled  5. Uncontrolled DM - blood sugar running high - Per IM - Consider HgbA1C  6. Hypokalemia - As above   Otherwise per primary:   Acute COPD exacerbation with acute respiratory failure   Pulmonary hypertension ([place])   Dehydration with hyponatremia   Acute bronchitis   Signed: Shyhiem Beeney, PA 12/14/2014, 12:39 PM  Co-Sign MD  Patinet seen and examined  Agree with findings of B Shahara H[place]field.  I have amended note She is back in SR   I agree with plans for dilt  Watch BP Should be on anticoagulation (CHADSVasc 5)   Unfortunately, if afib recurs, there is not a good antiarrhythmic for her given other medical issues WIll follow WOuld hold torsemide and check Cr in AM  May be a little dry.    Dorris Carnes

## 2014-12-15 DIAGNOSIS — I480 Paroxysmal atrial fibrillation: Secondary | ICD-10-CM

## 2014-12-15 DIAGNOSIS — J209 Acute bronchitis, unspecified: Secondary | ICD-10-CM

## 2014-12-15 DIAGNOSIS — J441 Chronic obstructive pulmonary disease with (acute) exacerbation: Principal | ICD-10-CM

## 2014-12-15 DIAGNOSIS — I272 Other secondary pulmonary hypertension: Secondary | ICD-10-CM

## 2014-12-15 DIAGNOSIS — E1165 Type 2 diabetes mellitus with hyperglycemia: Secondary | ICD-10-CM

## 2014-12-15 DIAGNOSIS — Z794 Long term (current) use of insulin: Secondary | ICD-10-CM

## 2014-12-15 DIAGNOSIS — I10 Essential (primary) hypertension: Secondary | ICD-10-CM

## 2014-12-15 LAB — CBC
HCT: 40.7 % (ref 36.0–46.0)
Hemoglobin: 13.3 g/dL (ref 12.0–15.0)
MCH: 31.8 pg (ref 26.0–34.0)
MCHC: 32.7 g/dL (ref 30.0–36.0)
MCV: 97.4 fL (ref 78.0–100.0)
PLATELETS: 201 10*3/uL (ref 150–400)
RBC: 4.18 MIL/uL (ref 3.87–5.11)
RDW: 13.1 % (ref 11.5–15.5)
WBC: 9.6 10*3/uL (ref 4.0–10.5)

## 2014-12-15 LAB — GLUCOSE, CAPILLARY
Glucose-Capillary: 248 mg/dL — ABNORMAL HIGH (ref 65–99)
Glucose-Capillary: 286 mg/dL — ABNORMAL HIGH (ref 65–99)
Glucose-Capillary: 281 mg/dL — ABNORMAL HIGH (ref 65–99)
Glucose-Capillary: 237 mg/dL — ABNORMAL HIGH (ref 65–99)

## 2014-12-15 LAB — BASIC METABOLIC PANEL
Anion gap: 19 — ABNORMAL HIGH (ref 5–15)
BUN: 86 mg/dL — AB (ref 6–20)
CHLORIDE: 81 mmol/L — AB (ref 101–111)
CO2: 33 mmol/L — ABNORMAL HIGH (ref 22–32)
CREATININE: 1.86 mg/dL — AB (ref 0.44–1.00)
Calcium: 9.9 mg/dL (ref 8.9–10.3)
GFR calc Af Amer: 30 mL/min — ABNORMAL LOW (ref >60)
GFR calc non Af Amer: 26 mL/min — ABNORMAL LOW (ref >60)
GLUCOSE: 290 mg/dL — AB (ref 65–99)
Potassium: 3.2 mmol/L — ABNORMAL LOW (ref 3.5–5.1)
SODIUM: 133 mmol/L — AB (ref 135–145)

## 2014-12-15 MED ORDER — IPRATROPIUM BROMIDE 0.02 % IN SOLN
0.5000 mg | Freq: Three times a day (TID) | RESPIRATORY_TRACT | Status: DC
  Administered 2014-12-16: 0.5 mg via RESPIRATORY_TRACT
  Filled 2014-12-15: qty 2.5

## 2014-12-15 MED ORDER — MAGNESIUM HYDROXIDE 400 MG/5ML PO SUSP
30.0000 mL | Freq: Once | ORAL | Status: AC
  Administered 2014-12-15: 30 mL via ORAL
  Filled 2014-12-15: qty 30

## 2014-12-15 MED ORDER — POTASSIUM CHLORIDE CRYS ER 20 MEQ PO TBCR
60.0000 meq | EXTENDED_RELEASE_TABLET | Freq: Four times a day (QID) | ORAL | Status: AC
  Administered 2014-12-15 (×2): 60 meq via ORAL
  Filled 2014-12-15 (×2): qty 3

## 2014-12-15 MED ORDER — DILTIAZEM HCL ER COATED BEADS 180 MG PO CP24: 180.0000 mg | ORAL_CAPSULE | Freq: Every day | ORAL | Status: AC

## 2014-12-15 MED ORDER — POLYETHYLENE GLYCOL 3350 17 G PO PACK
17.0000 g | PACK | Freq: Every day | ORAL | Status: DC
  Administered 2014-12-15 – 2014-12-16 (×2): 17 g via ORAL
  Filled 2014-12-15 (×2): qty 1

## 2014-12-15 MED ORDER — LEVALBUTEROL HCL 0.63 MG/3ML IN NEBU
0.6300 mg | INHALATION_SOLUTION | Freq: Three times a day (TID) | RESPIRATORY_TRACT | Status: DC
  Administered 2014-12-16: 0.63 mg via RESPIRATORY_TRACT
  Filled 2014-12-15: qty 3

## 2014-12-15 MED ORDER — TORSEMIDE 100 MG PO TABS: 50.0000 mg | ORAL_TABLET | Freq: Every day | ORAL | Status: DC

## 2014-12-15 MED ORDER — APIXABAN 5 MG PO TABS: 5.0000 mg | ORAL_TABLET | Freq: Two times a day (BID) | ORAL | Status: AC

## 2014-12-15 NOTE — Progress Notes (Signed)
Inpatient Diabetes Program Recommendations  AACE/ADA: New Consensus Statement on Inpatient Glycemic Control (2015)  Target Ranges:  Prepandial:   less than 140 mg/dL      Peak postprandial:   less than 180 mg/dL (1-2 hours)      Critically ill patients:  140 - 180 mg/dL   Review of Glycemic Control Results for CLATIE, KESSEN (MRN [id number]) as of 12/15/2014 13:35  Ref. Range 12/14/2014 06:31 12/14/2014 11:34 12/14/2014 16:03 12/14/2014 21:05 12/15/2014 05:38 12/15/2014 11:06  Glucose-Capillary Latest Ref Range: 65-99 mg/dL 232 (H) 292 (H) 353 (H) 306 (H) 248 (H) 286 (H)   Noted patient now on prednisone 40 mg bid. Levemir is continued at 50 units daily. Please consider increase in meal coverage to 8-10 units tidwc. Even with resistant correction, cbg's are continued high most probably due to prednisone which increases prandial insulin needs. (Pt takes 11 units meal coverage tidwc at home).  Thank you Rosita Kea, RN, MSN, CDE  Diabetes Inpatient Program Office: [phone] Pager: [phone] 8:00 am to 5:00 pm

## 2014-12-15 NOTE — Progress Notes (Signed)
Subjective: Breathing is OK  No CP   Objective: Filed Vitals:   12/14/14 1605 12/14/14 2038 12/14/14 2106 12/15/14 0552  BP: 133/75 120/58  118/60  Pulse: 88 81  74  Temp: 97.8 F (36.6 C) 98.1 F (36.7 C)  97.9 F (36.6 C)  TempSrc: Oral Oral  Oral  Resp: 18 18  18   Height:      Weight:    184 lb 12.8 oz (83.825 kg)  SpO2: 100% 97% 98% 96%   Weight change: -15 oz (-0.426 kg)  Intake/Output Summary (Last 24 hours) at 12/15/14 1018 Last data filed at 12/15/14 0930  Gross per 24 hour  Intake    840 ml  Output   2025 ml  Net  -1185 ml   Net I/O 10 L negative   General: Alert, awake, oriented x3, in no acute distress Neck:  JVP is normal Heart: Regular rate and rhythm, without murmurs, rubs, gallops.  Lungs: Mild wheezing.   Exemities:  No edema.   Neuro: Grossly intact, nonfocal.  Tele:  SR   Lab Results: Results for orders placed or performed during the hospital encounter of 12/09/14 (from the past 24 hour(s))  CBC     Status: Abnormal   Collection Time: 12/14/14 10:57 AM  Result Value Ref Range   WBC 10.8 (H) 4.0 - 10.5 K/uL   RBC 4.26 3.87 - 5.11 MIL/uL   Hemoglobin 13.6 12.0 - 15.0 g/dL   HCT 41.0 36.0 - 46.0 %   MCV 96.2 78.0 - 100.0 fL   MCH 31.9 26.0 - 34.0 pg   MCHC 33.2 30.0 - 36.0 g/dL   RDW 13.2 11.5 - 15.5 %   Platelets 214 150 - 400 K/uL  Basic metabolic panel     Status: Abnormal   Collection Time: 12/14/14 10:57 AM  Result Value Ref Range   Sodium 136 135 - 145 mmol/L   Potassium 3.4 (L) 3.5 - 5.1 mmol/L   Chloride 80 (L) 101 - 111 mmol/L   CO2 37 (H) 22 - 32 mmol/L   Glucose, Bld 306 (H) 65 - 99 mg/dL   BUN 89 (H) 6 - 20 mg/dL   Creatinine, Ser 1.90 (H) 0.44 - 1.00 mg/dL   Calcium 10.1 8.9 - 10.3 mg/dL   GFR calc non Af Amer 25 (L) >60 mL/min   GFR calc Af Amer 29 (L) >60 mL/min   Anion gap 19 (H) 5 - 15  Magnesium     Status: Abnormal   Collection Time: 12/14/14 10:57 AM  Result Value Ref Range   Magnesium 2.6 (H) 1.7 - 2.4 mg/dL    Troponin I     Status: None   Collection Time: 12/14/14 10:57 AM  Result Value Ref Range   Troponin I 0.03 <0.031 ng/mL  Glucose, capillary     Status: Abnormal   Collection Time: 12/14/14 11:34 AM  Result Value Ref Range   Glucose-Capillary 292 (H) 65 - 99 mg/dL   Comment 1 Notify RN   Glucose, capillary     Status: Abnormal   Collection Time: 12/14/14  4:03 PM  Result Value Ref Range   Glucose-Capillary 353 (H) 65 - 99 mg/dL  Troponin I     Status: Abnormal   Collection Time: 12/14/14  4:35 PM  Result Value Ref Range   Troponin I 0.04 (H) <0.031 ng/mL  Glucose, capillary     Status: Abnormal   Collection Time: 12/14/14  9:05 PM  Result Value Ref Range  Glucose-Capillary 306 (H) 65 - 99 mg/dL   Comment 1 Notify RN    Comment 2 Document in Chart   Troponin I     Status: Abnormal   Collection Time: 12/14/14  9:54 PM  Result Value Ref Range   Troponin I 0.06 (H) <0.031 ng/mL  Basic metabolic panel     Status: Abnormal   Collection Time: 12/15/14  2:36 AM  Result Value Ref Range   Sodium 133 (L) 135 - 145 mmol/L   Potassium 3.2 (L) 3.5 - 5.1 mmol/L   Chloride 81 (L) 101 - 111 mmol/L   CO2 33 (H) 22 - 32 mmol/L   Glucose, Bld 290 (H) 65 - 99 mg/dL   BUN 86 (H) 6 - 20 mg/dL   Creatinine, Ser 1.86 (H) 0.44 - 1.00 mg/dL   Calcium 9.9 8.9 - 10.3 mg/dL   GFR calc non Af Amer 26 (L) >60 mL/min   GFR calc Af Amer 30 (L) >60 mL/min   Anion gap 19 (H) 5 - 15  CBC     Status: None   Collection Time: 12/15/14  2:36 AM  Result Value Ref Range   WBC 9.6 4.0 - 10.5 K/uL   RBC 4.18 3.87 - 5.11 MIL/uL   Hemoglobin 13.3 12.0 - 15.0 g/dL   HCT 40.7 36.0 - 46.0 %   MCV 97.4 78.0 - 100.0 fL   MCH 31.8 26.0 - 34.0 pg   MCHC 32.7 30.0 - 36.0 g/dL   RDW 13.1 11.5 - 15.5 %   Platelets 201 150 - 400 K/uL  Glucose, capillary     Status: Abnormal   Collection Time: 12/15/14  5:38 AM  Result Value Ref Range   Glucose-Capillary 248 (H) 65 - 99 mg/dL    Studies/Results: Dg Chest Port 1  View  12/14/2014  CLINICAL DATA:  Chest pain, shortness of breath, productive cough EXAM: PORTABLE CHEST 1 VIEW COMPARISON:  12/09/2014 FINDINGS: Cardiomediastinal silhouette is stable. No acute infiltrate or pleural effusion. No pulmonary edema. Stable chronic mild interstitial prominence. Mild perihilar bronchitic changes. IMPRESSION: No acute infiltrate or pulmonary edema. Mild perihilar bronchitic changes. Electronically Signed   By: Lahoma Crocker M.D.   On: 12/14/2014 11:07    Medications: REviewed  @PROBHOSP @  1  Atrial fib  Pt is back in SR  Now on Eliquis per pharmacy  Follow heart rate on cardiazem  2.  Chronic diastolic CHF  Torsemide held yestreday.  Electrolytes have stabilized  Coming down  Would hold again.  Check BMET in AM    3  CKD  Cr has plateaued  Follow off of diuretic.    4.  HTN  BP is OK    3    LOS: 5 days   Dorris Carnes 12/15/2014, 10:18 AM

## 2014-12-15 NOTE — Discharge Summary (Signed)
Physician Discharge Summary  Penny Curry MBT:[id number] DOB: 1940-05-04 DOA: 12/09/2014  PCP: Ronita Hipps, MD  Admit date: 12/09/2014 Discharge date: 12/16/2014  Time spent: 40 minutes  Recommendations for Outpatient Follow-up:  1. Follow-up with primary care physician within one week. 2. Follow-up with heart failure clinic, torsemide dose decreased from 100 down to 50 mg daily. 3. Check BMP in 1 week.  Discharge Diagnoses:  Principal Problem:   COPD exacerbation ([place]) Active Problems:   Diabetes mellitus type 2, controlled ([place])   Essential hypertension   Pulmonary hypertension ([place])   Dehydration with hyponatremia   Acute bronchitis   Chronic diastolic heart failure ([facility])   PAF (paroxysmal atrial fibrillation) ([place])   Controlled type 2 diabetes mellitus with hyperglycemia, with long-term current use of insulin ([place])   Discharge Condition: Stable  Diet recommendation: Heart healthy  Filed Weights   12/14/14 0352 12/15/14 0552 12/16/14 0529  Weight: 84.251 kg (185 lb 11.8 oz) 83.825 kg (184 lb 12.8 oz) 83.689 kg (184 lb 8 oz)    History of present illness:  Penny Curry is a 74 y.o. 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 The patient states that about 2 weeks ago she developed cold-like symptoms with rhinorrhea, nasal congestion, sore throat and cough. She went to see her primary care doctor on October 7 who gave her a Rocephin injection and started her on oral clindamycin. However, her symptoms continued getting worse and today her PCP suggested for her to come to the emergency department.  In the ER, the patient  has received supplemental oxygen, bronchodilators sustaining partial relief. She is in no acute distress.   Hospital Course:   Acute COPD exacerbation with acute respiratory failure History of COPD presented with wheezing, shortness of breath and increased phlegm production. Treated with steroids, bronchodilators, antibiotics and mucolytics. Discharged on levofloxacin for 3 more days as well as prednisone taper till she gets back to her baseline.  She is on chronic steroids at home. Slow steroid taper to get her back to her home dose of 20 mg daily. Instructed to take OTC Mucinex.  Paroxysmal atrial fibrillation Atrial fibrillation, likely triggered by her lung disease. She does have a history of pulmonary hypertension. She has pulmonary fibrosis and COPD. She has spontaneously converted to sinus rhythm. Obtain CBC, basic metabolic panel, chest x-ray, troponin. Repeat EKG does not show any ischemic changes. Unable to use beta blocker due to significant wheezing that she was experiencing recently. Initiate Cardizem for now. Consult cardiology.  Echocardiogram from September showed normal EF with grade 1 diastolic dysfunction. Mild aortic stenosis noted. No mitral stenosis. CHA2DS2-VASc is 5 anticoagulation recommended, discharged on Eliquis and Cardizem  Diabetes mellitus type 2, uncontrolled CBGs are elevated, most likely secondary to steroids. Dose of Levemir was increased. Meal coverage was added. Continue resistance sliding scale coverage as well. HbA1c was 7.8 in May. CBGs should improve as steroid is tapered down.  History of essential hypertension Continue to monitor blood pressures closely. Continue her home medications.  History of pulmonary hypertension Patient is on torsemide 50 mg twice a day. Had worsening of her creatinine during hospital stay likely secondary to overdiuresis. Torsemide held. On discharge 50 mg daily  instead of twice a day, follow with heart failure clinic next  week.  Mild dehydration with hyponatremia Improved after holding diuretics.  History of depression Continue her psychotropic agents.   Procedures:  None  Consultations:  Cardiology  Discharge Exam: Filed Vitals:   12/16/14 0529  BP: 116/55  Pulse: 57  Temp: 97.7 F (36.5 C)  Resp: 20   General: Alert and awake, oriented x3, not in any acute distress. HEENT: anicteric sclera, pupils reactive to light and accommodation, EOMI CVS: S1-S2 clear, no murmur rubs or gallops Chest: clear to auscultation bilaterally, no wheezing, rales or rhonchi Abdomen: soft nontender, nondistended, normal bowel sounds, no organomegaly Extremities: no cyanosis, clubbing or edema noted bilaterally Neuro: Cranial nerves II-XII intact, no focal neurological deficits  Discharge Instructions   Discharge Instructions    Call MD for:  difficulty breathing, headache or visual disturbances    Complete by:  As directed      Call MD for:  extreme fatigue    Complete by:  As directed      Call MD for:  persistant dizziness or light-headedness    Complete by:  As directed      Call MD for:  persistant nausea and vomiting    Complete by:  As directed      Call MD for:  severe uncontrolled pain    Complete by:  As directed      Call MD for:  temperature >100.4    Complete by:  As directed      Diet Carb Modified    Complete by:  As directed      Discharge instructions    Complete by:  As directed   Please take your medications as prescribed. Follow up with your PCP this week or early next week.  You were cared for by a hospitalist during your hospital stay. If you have any questions about your discharge medications or the care you received while you were in the hospital after you are discharged, you can call the unit and asked to speak with the hospitalist on call if the hospitalist that took care of you is not available. Once you are discharged, your primary care physician will handle any further  medical issues. Please note that NO REFILLS for any discharge medications will be authorized once you are discharged, as it is imperative that you return to your primary care physician (or establish a relationship with a primary care physician if you do not have one) for your aftercare needs so that they can reassess your need for medications and monitor your lab values. If you do not have a primary care physician, you can call [phone] for a physician referral.     Increase activity slowly    Complete by:  As directed           Current Discharge Medication List    START taking these medications   Details  apixaban (ELIQUIS) 5 MG TABS tablet Take 1 tablet (5 mg total) by mouth 2 (two) times daily. Qty: 60 tablet, Refills: 0    diltiazem (CARDIZEM CD) 180 MG 24 hr capsule Take 1 capsule (180 mg total) by mouth daily. Qty: 30 capsule, Refills: 0    levofloxacin (LEVAQUIN) 750 MG tablet Take 1 tablet (750 mg total) by mouth every other day. Starting 12/16/14 for 3 more doses Qty: 3 tablet, Refills: 0      CONTINUE these medications which have CHANGED   Details  predniSONE (DELTASONE) 20 MG tablet  Take 2 tablets twice daily for 5 days, then take 2 tablets once daily for 5 days and then one tablet once daily as before. Qty: 60 tablet, Refills: 1    torsemide (DEMADEX) 100 MG tablet Take 0.5 tablets (50 mg total) by mouth daily.      CONTINUE these medications which have NOT CHANGED   Details  albuterol (PROVENTIL) (2.5 MG/3ML) 0.083% nebulizer solution Take 3 mLs (2.5 mg total) by nebulization 3 (three) times daily. Qty: 270 mL, Refills: 11    aspirin 81 MG chewable tablet Chew 1 tablet (81 mg total) by mouth daily. Qty: 30 tablet, Refills: 0    Benzonatate (TESSALON PO) Take 1 capsule by mouth daily as needed (cough).    citalopram (CELEXA) 40 MG tablet Take 40 mg by mouth at bedtime.     Colesevelam HCl 3.75 G PACK Take 1 packet by mouth every evening.    divalproex (DEPAKOTE  ER) 500 MG 24 hr tablet Take 1,000 mg by mouth at bedtime.    ergocalciferol (VITAMIN D2) 50000 UNITS capsule Take 50,000 Units by mouth every Friday.    fluticasone (FLONASE) 50 MCG/ACT nasal spray Use 2 sprays in each  nostril daily Qty: 48 g, Refills: 1    gabapentin (NEURONTIN) 300 MG capsule Take 300 mg by mouth at bedtime.    HUMALOG KWIKPEN 100 UNIT/ML KiwkPen Inject 11 Units into the skin 3 (three) times daily.     LEVEMIR FLEXTOUCH 100 UNIT/ML Pen Inject 40 Units into the skin daily with breakfast.     loratadine (CLARITIN) 10 MG tablet Take 1 tablet (10 mg total) by mouth daily. Qty: 30 tablet, Refills: 11   Associated Diagnoses: Allergic rhinitis, seasonal    metolazone (ZAROXOLYN) 2.5 MG tablet Take 1 tablet (2.5 mg total) by mouth as needed (for weight 203 or greater). Qty: 20 tablet, Refills: 3    ondansetron (ZOFRAN-ODT) 8 MG disintegrating tablet Take 8 mg by mouth every 6 (six) hours as needed for nausea or vomiting.     OXYGEN Inhale 3 L into the lungs continuous.    pantoprazole (PROTONIX) 40 MG tablet Take 40 mg by mouth daily.     potassium chloride (KLOR-CON) 10 MEQ CR tablet Take 20 mEq by mouth daily before breakfast.     PROAIR HFA 108 (90 BASE) MCG/ACT inhaler Use 2 puffs every 6 hours  as needed for wheezing or  shortness of breath Qty: 3 Inhaler, Refills: 1    rOPINIRole (REQUIP) 3 MG tablet Take 3 mg by mouth 2 (two) times daily.      spironolactone (ALDACTONE) 25 MG tablet Take 25 mg by mouth 2 (two) times daily.     STIOLTO RESPIMAT 2.5-2.5 MCG/ACT AERS Inhale 2 puffs into the  lungs daily Qty: 12 g, Refills: 1    tetrahydrozoline-zinc (VISINE-AC) 0.05-0.25 % ophthalmic solution Place 1 drop into both eyes daily as needed (dry eyes).    traMADol (ULTRAM) 50 MG tablet Take 50-100 mg by mouth 2 (two) times daily. Takes 1 tablet in the morning and 2 tablets at night       Allergies  Allergen Reactions  . Clindamycin/Lincomycin Swelling and  Rash  . Penicillins Swelling and Rash      . Tape Itching and Rash    Paper tape is ok.   Follow-up Information    Follow up with Ronita Hipps, MD On 12/21/2014.   Specialty:  Family Medicine   Why:  @ 2:30 PM post hospitalization follow up.  Confirmed appointment with Adela Lank. Please fax a copy of the discharge summay to the office upon discharge.    Contact information:   [place] [phone]       Follow up with Collene Gobble., MD On 01/07/2015.   Specialty:  Pulmonary Disease   Why:  @ 2:15 PM copd and pulm fibrosis with multiple hospitalizations recently.Marland Kitchen Confirmed appointment with Chantel   Contact information:   [address] [place] [address] [phone]        The results of significant diagnostics from this hospitalization (including imaging, microbiology, ancillary and laboratory) are listed below for reference.    Significant Diagnostic Studies: Dg Chest 2 View  12/09/2014  CLINICAL DATA:  Two week history of shortness of breath and productive cough. Concern for worsening respiratory infection. History of pulmonary sarcoid. EXAM: CHEST  2 VIEW COMPARISON:  Radiograph 10/22/2014.  CT 12/2014 FINDINGS: Cardiomediastinal contours are unchanged. Interstitial reticular opacity is are unchanged in degree compared to prior exam. The small ground-glass regions on prior CT are not well seen. No confluent airspace disease. No pleural effusion. No findings of superimposed pulmonary edema. No pneumothorax. Unchanged appearance of the osseous structures. IMPRESSION: Unchanged appearance of the chest with increased interstitial markings, stable in degree from prior. No superimposed acute process. Electronically Signed   By: Jeb Levering M.D.   On: 12/09/2014 18:15   Dg Chest Port 1 View  12/14/2014  CLINICAL DATA:  Chest pain, shortness of breath, productive cough EXAM: PORTABLE CHEST 1 VIEW COMPARISON:  12/09/2014 FINDINGS:  Cardiomediastinal silhouette is stable. No acute infiltrate or pleural effusion. No pulmonary edema. Stable chronic mild interstitial prominence. Mild perihilar bronchitic changes. IMPRESSION: No acute infiltrate or pulmonary edema. Mild perihilar bronchitic changes. Electronically Signed   By: Lahoma Crocker M.D.   On: 12/14/2014 11:07    Microbiology: No results found for this or any previous visit (from the past 240 hour(s)).   Labs: Basic Metabolic Panel:  Recent Labs Lab 12/11/14 0300 12/14/14 1057 12/15/14 0236 12/16/14 0255 12/16/14 0804  NA 132* 136 133* 132* 136  K 4.3 3.4* 3.2* 5.6* 4.9  CL 93* 80* 81* 88* 90*  CO2 29 37* 33* 36* 32  GLUCOSE 268* 306* 290* 254* 217*  BUN 41* 89* 86* 70* 64*  CREATININE 1.36* 1.90* 1.86* 1.45* 1.35*  CALCIUM 9.7 10.1 9.9 9.6 10.1  MG  --  2.6*  --   --   --    Liver Function Tests:  Recent Labs Lab 12/10/14 0240  AST 26  ALT 33  ALKPHOS 63  BILITOT 0.5  PROT 6.6  ALBUMIN 3.4*   No results for input(s): LIPASE, AMYLASE in the last 168 hours. No results for input(s): AMMONIA in the last 168 hours. CBC:  Recent Labs Lab 12/09/14 1740 12/10/14 0240 12/11/14 0300 12/14/14 1057 12/15/14 0236  WBC 12.6* 11.1* 12.5* 10.8* 9.6  NEUTROABS 9.9* 7.1  --   --   --   HGB 12.2 11.5* 11.7* 13.6 13.3  HCT 36.2 34.0* 35.5* 41.0 40.7  MCV 96.0 96.0 98.1 96.2 97.4  PLT 232 200 194 214 201   Cardiac Enzymes:  Recent Labs Lab 12/14/14 1057 12/14/14 1635 12/14/14 2154  TROPONINI 0.03 0.04* 0.06*   BNP: BNP (last 3 results)  Recent Labs  09/06/14 2150 10/22/14 1200 12/09/14 1930  BNP 127.2* 107.8* 93.1    ProBNP (last 3 results) No results for input(s): PROBNP in the last 8760 hours.  CBG:  Recent Labs Lab 12/15/14 0538 12/15/14 1106 12/15/14 1611 12/15/14 2117 12/16/14 0640  GLUCAP 248* 286* 281* 237* 206*       Signed:  Demetris Meinhardt A  [facility] 12/16/2014, 10:26 AM

## 2014-12-15 NOTE — Progress Notes (Signed)
Physical Therapy Treatment Patient Details Name: Penny Curry MRN: [id number] DOB: 1940/11/19 Today's Date: 12/15/2014    History of Present Illness 74 year old Caucasian female with a past medical history of hypertension, diabetes, COPD and pulmonary fibrosis who is glucocorticoid dependent and on home oxygen, presented with two-week history of progressively worsening shortness of breath. Patient was noted to have acute COPD exacerbation    PT Comments    PT session focused on ambulation and increasing activity tolerance.  Pt demonstrating improved activity tolerance with O2 sats >91% throughout session on 2L [place] and very mild increase in dyspnea.  Pt with LOBx2 with ambulation today.  Pt will benefit from continued acute skilled PT services to maximize safety and independence with functional mobility and to improve dynamic balance and activity tolerance.  Follow Up Recommendations  No PT follow up     Equipment Recommendations  None recommended by PT    Recommendations for Other Services       Precautions / Restrictions Precautions Precautions: Fall Restrictions Weight Bearing Restrictions: No    Mobility  Bed Mobility Overal bed mobility: Modified Independent Bed Mobility: Supine to Sit     Supine to sit: Modified independent (Device/Increase time)        Transfers Overall transfer level: Needs assistance Equipment used: Straight cane Transfers: Sit to/from Stand Sit to Stand: Supervision            Ambulation/Gait Ambulation/Gait assistance: Min guard Ambulation Distance (Feet): 115 Feet Assistive device: Straight cane Gait Pattern/deviations: Step-through pattern;Drifts right/left;Decreased stride length Gait velocity: slow Gait velocity interpretation: Below normal speed for age/gender General Gait Details: LOB x2 noted to L with pt reaching for furniture/hand rail. Pt may benefit from increased stability of RW.  On 2L [place] at rest O2 sats 98%.  Dropped  to 92% with ambulation with very mild increase in dyspnea.    Stairs            Wheelchair Mobility    Modified Rankin (Stroke Patients Only)       Balance Overall balance assessment: Needs assistance Sitting-balance support: Feet supported;No upper extremity supported Sitting balance-Leahy Scale: Good     Standing balance support: Single extremity supported;During functional activity Standing balance-Leahy Scale: Fair                      Cognition Arousal/Alertness: Awake/alert Behavior During Therapy: WFL for tasks assessed/performed Overall Cognitive Status: Within Functional Limits for tasks assessed                      Exercises      General Comments General comments (skin integrity, edema, etc.): Tremors very mild today.      Pertinent Vitals/Pain Pain Assessment: Faces Faces Pain Scale: Hurts a little bit Pain Location: chest (notes that it burns from coughing) Pain Descriptors / Indicators: Burning Pain Intervention(s): Monitored during session    Home Living                      Prior Function            PT Goals (current goals can now be found in the care plan section) Acute Rehab PT Goals Patient Stated Goal: not stated PT Goal Formulation: With patient/family Time For Goal Achievement: 12/24/14 Potential to Achieve Goals: Good Progress towards PT goals: Progressing toward goals    Frequency  Min 3X/week    PT Plan Current plan remains appropriate  Co-evaluation             End of Session Equipment Utilized During Treatment: Gait belt;Oxygen (2L [place]) Activity Tolerance: Patient tolerated treatment well Patient left: in chair;with call bell/phone within reach;with chair alarm set     Time: 3086-5784 PT Time Calculation (min) (ACUTE ONLY): 16 min  Charges:  $Gait Training: 8-22 mins                    G Codes:      Devonia Farro 01/06/2015, 10:46 AM Lorita Officer, SPT

## 2014-12-15 NOTE — Progress Notes (Signed)
[facility] PROGRESS NOTE  Penny Curry GHW:[id number] DOB: [birth year] DOA: 12/09/2014  PCP: Ronita Hipps, MD  Brief HPI: 74 year old Caucasian female with a past medical history of hypertension, diabetes, COPD and pulmonary fibrosis who is glucocorticoid dependent and on home oxygen, presented with two-week history of progressively worsening shortness of breath. Patient was noted to have acute COPD exacerbation. She was hospitalized for further management. Patient was treated with nebulizer treatments, steroids and antibiotics. She started improving. On the day of discharge, patient went into rapid atrial fibrillation. Discharge was canceled.  Subjective: Called by the nurse after patient was noted to have an elevated heart rate. She was complaining of palpitations. She complains of some tightness in her chest. Feels a little nauseated. She thinks she may have had atrial fibrillation in the past, but is not certain.   Assessment/Plan:  Principal Problem:   COPD exacerbation (HCC) Active Problems:   Diabetes mellitus type 2, controlled ([place])   Essential hypertension   Pulmonary hypertension ([place])   Dehydration with hyponatremia   Acute bronchitis   PAF (paroxysmal atrial fibrillation) (HCC)   Paroxysmal atrial fibrillation Atrial fibrillation, likely triggered by her lung disease. Has PAH/pulmonary fibrosis/COPD.  She has spontaneously converted to sinus rhythm.  Unable to use beta blocker due to significant wheezing that she was experiencing recently.  Cardiology consulted, Cardizem and Eliquis started. Echocardiogram from September showed normal EF with grade 1 diastolic dysfunction. Mild aortic stenosis but no mitral stenosis CHA2DS2-VASc of 5  Acute COPD exacerbation with acute respiratory failure Patient is much better. Continue oral steroids. Change albuterol to xopenex considering A. fib.  Continue oral antibiotics.  Patient is on home oxygen. Continue O2.  Patient has a long-standing history of COPD along with pulmonary fibrosis.  She is on chronic steroids.  Acute renal failure Creatinine baseline is 0.8 from September 2016, creatinine on admission was 1.5. Creatinine worsened to 1.9 yesterday, patient diuretics discontinued. Check BMP in a.m.  Diabetes mellitus type 2, uncontrolled CBGs are elevated, most likely secondary to steroids. Dose of Levemir was increased. Meal coverage was added. Continue resistance sliding scale coverage as well. HbA1c was 7.8 in May. CBGs should improve as steroid is tapered down.  History of essential hypertension Continue to monitor blood pressures closely. Continue her home medications.  History of pulmonary hypertension Stable. Diuretics on hold.  Mild dehydration with hyponatremia Improved with hydration. Continued to monitor.  History of depression Continue her psychotropic agents.  Hypokalemia Likely secondary to diuresis, replete with oral supplements, check BMP in a.m.   DVT Prophylaxis: Lovenox    Code Status: DNR Family Communication: Discussed with the patient and her granddaughter Disposition Plan: Likely discharge in a.m.   Past medical history:  Past Medical History  Diagnosis Date  . HTN (hypertension)   . Diabetes mellitus   . Fibrosis of lung ([place])   . CHF (congestive heart failure) ([place])   . GERD (gastroesophageal reflux disease)   . Chronic headache     migraines  . UTI (urinary tract infection)     " MULTIPLE TIMES THIS PAST YEAR "  . Arthritis   . Anemia   . Cataract   . COPD (chronic obstructive pulmonary disease) ([place])   . Depression   . Osteoporosis   . Hyperlipidemia     Consultants: None  Procedures: None  Antibiotics: Levaquin  Objective: Vital Signs  Filed Vitals:   12/14/14 1605 12/14/14 2038 12/14/14 2106 12/15/14 0552  BP: 133/75 120/58  118/60  Pulse: 88 81  74  Temp: 97.8 F (36.6 C) 98.1 F (36.7 C)  97.9 F (36.6 C)  TempSrc: Oral  Oral  Oral  Resp: 18 18  18   Height:      Weight:    83.825 kg (184 lb 12.8 oz)  SpO2: 100% 97% 98% 96%    Intake/Output Summary (Last 24 hours) at 12/15/14 1207 Last data filed at 12/15/14 1147  Gross per 24 hour  Intake   1200 ml  Output   2400 ml  Net  -1200 ml   Filed Weights   12/13/14 0536 12/14/14 0352 12/15/14 0552  Weight: 84.46 kg (186 lb 3.2 oz) 84.251 kg (185 lb 11.8 oz) 83.825 kg (184 lb 12.8 oz)   Telemetry did show atrial fibrillation in the 130s. Subsequently converted back to sinus rhythm.  General appearance: alert, cooperative, appears stated age, no distress and moderately obese Resp: Improved air entry bilaterally. Scattered wheezes. No crackles or rhonchi.  Cardio: regular rate and rhythm, S1, S2 normal, no murmur, click, rub or gallop GI: soft, non-tender; bowel sounds normal; no masses,  no organomegaly Neurologic: Alert and oriented 3. No focal neurological deficits are noted.  Lab Results:  Basic Metabolic Panel:  Recent Labs Lab 12/09/14 1740 12/10/14 0240 12/11/14 0300 12/14/14 1057 12/15/14 0236  NA 129* 131* 132* 136 133*  K 4.4 4.0 4.3 3.4* 3.2*  CL 84* 87* 93* 80* 81*  CO2 32 32 29 37* 33*  GLUCOSE 309* 274* 268* 306* 290*  BUN 32* 33* 41* 89* 86*  CREATININE 1.51* 1.39* 1.36* 1.90* 1.86*  CALCIUM 9.8 9.5 9.7 10.1 9.9  MG  --   --   --  2.6*  --    Liver Function Tests:  Recent Labs Lab 12/10/14 0240  AST 26  ALT 33  ALKPHOS 63  BILITOT 0.5  PROT 6.6  ALBUMIN 3.4*   CBC:  Recent Labs Lab 12/09/14 1740 12/10/14 0240 12/11/14 0300 12/14/14 1057 12/15/14 0236  WBC 12.6* 11.1* 12.5* 10.8* 9.6  NEUTROABS 9.9* 7.1  --   --   --   HGB 12.2 11.5* 11.7* 13.6 13.3  HCT 36.2 34.0* 35.5* 41.0 40.7  MCV 96.0 96.0 98.1 96.2 97.4  PLT 232 200 194 214 201   BNP (last 3 results)  Recent Labs  09/06/14 2150 10/22/14 1200 12/09/14 1930  BNP 127.2* 107.8* 93.1     CBG:  Recent Labs Lab 12/14/14 1134 12/14/14 1603  12/14/14 2105 12/15/14 0538 12/15/14 1106  GLUCAP 292* 353* 306* 248* 286*    No results found for this or any previous visit (from the past 240 hour(s)).    Studies/Results: Dg Chest Port 1 View  12/14/2014  CLINICAL DATA:  Chest pain, shortness of breath, productive cough EXAM: PORTABLE CHEST 1 VIEW COMPARISON:  12/09/2014 FINDINGS: Cardiomediastinal silhouette is stable. No acute infiltrate or pleural effusion. No pulmonary edema. Stable chronic mild interstitial prominence. Mild perihilar bronchitic changes. IMPRESSION: No acute infiltrate or pulmonary edema. Mild perihilar bronchitic changes. Electronically Signed   By: Lahoma Crocker M.D.   On: 12/14/2014 11:07    Medications:  Scheduled: . apixaban  5 mg Oral BID  . aspirin  81 mg Oral Daily  . citalopram  40 mg Oral QHS  . colesevelam  625 mg Oral Q supper  . diltiazem  60 mg Oral 3 times per day  . divalproex  1,000 mg Oral QHS  . fluticasone  2 spray Each Nare Daily  .  gabapentin  300 mg Oral QHS  . guaiFENesin  600 mg Oral BID  . insulin aspart  0-20 Units Subcutaneous TID WC  . insulin aspart  0-5 Units Subcutaneous QHS  . insulin aspart  8 Units Subcutaneous TID WC  . insulin detemir  50 Units Subcutaneous Q breakfast  . ipratropium  0.5 mg Nebulization 3 times per day  . levalbuterol  0.63 mg Nebulization Q8H  . levofloxacin  750 mg Oral Q48H  . pantoprazole  40 mg Oral Daily  . potassium chloride  60 mEq Oral Q6H  . predniSONE  40 mg Oral BID WC  . rOPINIRole  3 mg Oral BID  . sodium chloride  3 mL Intravenous Q12H  . sodium chloride  3 mL Intravenous Q12H  . traMADol  100 mg Oral QHS  . traMADol  50 mg Oral Daily  . Vitamin D (Ergocalciferol)  50,000 Units Oral Q Fri   Continuous:  KGU:RKYHCW chloride, acetaminophen, benzonatate, levalbuterol, loratadine, ondansetron **OR** ondansetron (ZOFRAN) IV, sodium chloride    LOS: 5 days   [facility] A  [facility] Pager [phone]  12/15/2014, 12:07  PM  If 7PM-7AM, please contact night-coverage at [email or web address], password [facility]

## 2014-12-16 ENCOUNTER — Encounter (HOSPITAL_COMMUNITY): Payer: Medicare Other | Admitting: Internal Medicine

## 2014-12-16 DIAGNOSIS — E871 Hypo-osmolality and hyponatremia: Secondary | ICD-10-CM

## 2014-12-16 DIAGNOSIS — I5032 Chronic diastolic (congestive) heart failure: Secondary | ICD-10-CM

## 2014-12-16 LAB — BASIC METABOLIC PANEL
ANION GAP: 8 (ref 5–15)
ANION GAP: 14 (ref 5–15)
BUN: 70 mg/dL — ABNORMAL HIGH (ref 6–20)
BUN: 64 mg/dL — ABNORMAL HIGH (ref 6–20)
CALCIUM: 9.6 mg/dL (ref 8.9–10.3)
CHLORIDE: 88 mmol/L — AB (ref 101–111)
CHLORIDE: 90 mmol/L — AB (ref 101–111)
CO2: 36 mmol/L — AB (ref 22–32)
CO2: 32 mmol/L (ref 22–32)
CREATININE: 1.35 mg/dL — AB (ref 0.44–1.00)
Calcium: 10.1 mg/dL (ref 8.9–10.3)
Creatinine, Ser: 1.45 mg/dL — ABNORMAL HIGH (ref 0.44–1.00)
GFR calc non Af Amer: 35 mL/min — ABNORMAL LOW (ref >60)
GFR calc non Af Amer: 38 mL/min — ABNORMAL LOW (ref >60)
GFR, EST AFRICAN AMERICAN: 40 mL/min — AB (ref >60)
GFR, EST AFRICAN AMERICAN: 44 mL/min — AB (ref >60)
Glucose, Bld: 254 mg/dL — ABNORMAL HIGH (ref 65–99)
Glucose, Bld: 217 mg/dL — ABNORMAL HIGH (ref 65–99)
POTASSIUM: 5.6 mmol/L — AB (ref 3.5–5.1)
Potassium: 4.9 mmol/L (ref 3.5–5.1)
SODIUM: 136 mmol/L (ref 135–145)
Sodium: 132 mmol/L — ABNORMAL LOW (ref 135–145)

## 2014-12-16 LAB — GLUCOSE, CAPILLARY
GLUCOSE-CAPILLARY: 262 mg/dL — AB (ref 65–99)
Glucose-Capillary: 206 mg/dL — ABNORMAL HIGH (ref 65–99)

## 2014-12-16 MED ORDER — DILTIAZEM HCL ER COATED BEADS 180 MG PO CP24
180.0000 mg | ORAL_CAPSULE | Freq: Every day | ORAL | Status: DC
Start: 2014-12-16 — End: 2014-12-16
  Administered 2014-12-16: 180 mg via ORAL
  Filled 2014-12-16: qty 1

## 2014-12-16 MED ORDER — TORSEMIDE 20 MG PO TABS: 50.0000 mg | ORAL_TABLET | Freq: Two times a day (BID) | ORAL | Status: DC

## 2014-12-16 NOTE — Progress Notes (Signed)
Dr. Hartford Poli responded to page at 1410 so that discharge may be completed.

## 2014-12-16 NOTE — Progress Notes (Signed)
   Subjective: No CP  Objective: Filed Vitals:   12/15/14 2007 12/15/14 2139 12/16/14 0529 12/16/14 0723  BP: 114/60  116/55   Pulse: 75  57   Temp: 97.8 F (36.6 C)  97.7 F (36.5 C)   TempSrc: Oral  Oral   Resp: 18  20   Height:      Weight:   184 lb 8 oz (83.689 kg)   SpO2: 98% 97% 99% 97%   Weight change: -4.8 oz (-0.136 kg)  Intake/Output Summary (Last 24 hours) at 12/16/14 0754 Last data filed at 12/16/14 0535  Gross per 24 hour  Intake   1440 ml  Output   2300 ml  Net   -860 ml    General: Alert, awake, oriented x3, in no acute distress Neck:  JVP is normal Heart: Regular rate and rhythm, without murmurs, rubs, gallops.  Lungs: Clear to auscultation.  No rales or wheezes. Exemities:  No edema.   Neuro: Grossly intact, nonfocal.   Lab Results: Results for orders placed or performed during the hospital encounter of 12/09/14 (from the past 24 hour(s))  Glucose, capillary     Status: Abnormal   Collection Time: 12/15/14 11:06 AM  Result Value Ref Range   Glucose-Capillary 286 (H) 65 - 99 mg/dL   Comment 1 Notify RN   Glucose, capillary     Status: Abnormal   Collection Time: 12/15/14  4:11 PM  Result Value Ref Range   Glucose-Capillary 281 (H) 65 - 99 mg/dL   Comment 1 Notify RN   Glucose, capillary     Status: Abnormal   Collection Time: 12/15/14  9:17 PM  Result Value Ref Range   Glucose-Capillary 237 (H) 65 - 99 mg/dL   Comment 1 Notify RN    Comment 2 Document in Chart   Basic metabolic panel     Status: Abnormal   Collection Time: 12/16/14  2:55 AM  Result Value Ref Range   Sodium 132 (L) 135 - 145 mmol/L   Potassium 5.6 (H) 3.5 - 5.1 mmol/L   Chloride 88 (L) 101 - 111 mmol/L   CO2 36 (H) 22 - 32 mmol/L   Glucose, Bld 254 (H) 65 - 99 mg/dL   BUN 70 (H) 6 - 20 mg/dL   Creatinine, Ser 1.45 (H) 0.44 - 1.00 mg/dL   Calcium 9.6 8.9 - 10.3 mg/dL   GFR calc non Af Amer 35 (L) >60 mL/min   GFR calc Af Amer 40 (L) >60 mL/min   Anion gap 8 5 - 15    Glucose, capillary     Status: Abnormal   Collection Time: 12/16/14  6:40 AM  Result Value Ref Range   Glucose-Capillary 206 (H) 65 - 99 mg/dL    Studies/Results: No results found.  Medications:   @PROBHOSP @  Please see accompanying note in EPIC    LOS: 6 days   Penny Curry 12/16/2014, 7:54 AM

## 2014-12-16 NOTE — Progress Notes (Addendum)
Subjective: Some burning chest discomfort last pm lasted about 2 hours, she did not notify anyone.    Breathing is OK   Was walking earlier    Objective: Vital signs in last 24 hours: Temp:  [97.6 F (36.4 C)-97.8 F (36.6 C)] 97.7 F (36.5 C) (10/27 0529) Pulse Rate:  [57-75] 57 (10/27 0529) Resp:  [18-20] 20 (10/27 0529) BP: (114-119)/(55-61) 116/55 mmHg (10/27 0529) SpO2:  [97 %-99 %] 97 % (10/27 0723) Weight:  [184 lb 8 oz (83.689 kg)] 184 lb 8 oz (83.689 kg) (10/27 0529) Weight change: -4.8 oz (-0.136 kg) Last BM Date: 12/11/14 Intake/Output from previous day: -860  Wt stable at 184 overall down from 192 10/26 0701 - 10/27 0700 In: 1440 [P.O.:1440] Out: 2300 [Urine:2300] Intake/Output this shift:    PE: General:Pleasant affect, NAD Skin:Warm and dry, brisk capillary refill HEENT:normocephalic, sclera clear, mucus membranes moist Neck:supple, no JVD, no bruits  Heart:S1S2 RRR without murmur, gallup, rub or click Lungs:decreased in bases without rales, rhonchi, or wheezes KWI:OXBD, non tender, + BS, do not palpate liver spleen or masses Ext:no lower ext edema, 2+ pedal pulses, 2+ radial pulses Neuro:alert and oriented X 3, MAE, follows commands, + facial symmetry Tele: SR   Lab Results:  Recent Labs  12/14/14 1057 12/15/14 0236  WBC 10.8* 9.6  HGB 13.6 13.3  HCT 41.0 40.7  PLT 214 201   BMET  Recent Labs  12/16/14 0255 12/16/14 0804  NA 132* 136  K 5.6* 4.9  CL 88* 90*  CO2 36* 32  GLUCOSE 254* 217*  BUN 70* 64*  CREATININE 1.45* 1.35*  CALCIUM 9.6 10.1    Recent Labs  12/14/14 1635 12/14/14 2154  TROPONINI 0.04* 0.06*    No results found for: CHOL, HDL, LDLCALC, LDLDIRECT, TRIG, CHOLHDL Lab Results  Component Value Date   HGBA1C 7.8* 07/04/2014     Lab Results  Component Value Date   TSH 3.298 09/07/2014        Studies/Results: Dg Chest Port 1 View  12/14/2014  CLINICAL DATA:  Chest pain, shortness of breath,  productive cough EXAM: PORTABLE CHEST 1 VIEW COMPARISON:  12/09/2014 FINDINGS: Cardiomediastinal silhouette is stable. No acute infiltrate or pleural effusion. No pulmonary edema. Stable chronic mild interstitial prominence. Mild perihilar bronchitic changes. IMPRESSION: No acute infiltrate or pulmonary edema. Mild perihilar bronchitic changes. Electronically Signed   By: Lahoma Crocker M.D.   On: 12/14/2014 11:07    Medications: I have reviewed the patient's current medications. Scheduled Meds: . apixaban  5 mg Oral BID  . aspirin  81 mg Oral Daily  . citalopram  40 mg Oral QHS  . colesevelam  625 mg Oral Q supper  . diltiazem  60 mg Oral 3 times per day  . divalproex  1,000 mg Oral QHS  . fluticasone  2 spray Each Nare Daily  . gabapentin  300 mg Oral QHS  . guaiFENesin  600 mg Oral BID  . insulin aspart  0-20 Units Subcutaneous TID WC  . insulin aspart  0-5 Units Subcutaneous QHS  . insulin aspart  8 Units Subcutaneous TID WC  . insulin detemir  50 Units Subcutaneous Q breakfast  . ipratropium  0.5 mg Nebulization TID  . levalbuterol  0.63 mg Nebulization TID  . levofloxacin  750 mg Oral Q48H  . pantoprazole  40 mg Oral Daily  . polyethylene glycol  17 g Oral Daily  . predniSONE  40 mg Oral  BID WC  . rOPINIRole  3 mg Oral BID  . sodium chloride  3 mL Intravenous Q12H  . sodium chloride  3 mL Intravenous Q12H  . traMADol  100 mg Oral QHS  . traMADol  50 mg Oral Daily  . Vitamin D (Ergocalciferol)  50,000 Units Oral Q Fri   Continuous Infusions:  PRN Meds:.sodium chloride, acetaminophen, benzonatate, levalbuterol, loratadine, ondansetron **OR** ondansetron (ZOFRAN) IV, sodium chloride  Assessment/Plan: Principal Problem:   COPD exacerbation (HCC) Active Problems:   Diabetes mellitus type 2, controlled ([place])   Essential hypertension   Pulmonary hypertension ([place])   Dehydration with hyponatremia   Acute bronchitis   Chronic diastolic heart failure (HCC)   PAF (paroxysmal atrial  fibrillation) ([place])   Controlled type 2 diabetes mellitus with hyperglycemia, with long-term current use of insulin ([place])  1. PAF now SR on Eliquis,  On dilt at 60 mg 3X day ? Change to 180 mg daily.  CHA2DS2-VASc of 5 --possible discharge today  2. Chronic diastolic HF torsemide held last 2 days. Neg 11,140 since admit  Wt down from 192 to 184 lbs.      K+ now 4.9, previous 5.6-K+ supplement stopped ;  Cr. Improved from 1.86 to 1.35.  Was to have been seen in HF clinic today.  Would resume po torsemide  50 bid  No aldactone.  Follow Cr   3.  CKD improving  4,. HTN controlled.   LOS: 6 days   Time spent with pt. :15 minutes. [facility] R  Nurse Practitioner Certified Pager [phone] or after 5pm and on weekends call [phone] 12/16/2014, 9:07 AM  Pt seen and examined  Remains in SR  Would consoldate dilt to qd dosing. Keep on Eliquis I am not convinced chest discomfort is cardiac  Has course cough    OK to d/c from cardiac standpoint  Will make sure has follow.up.quickly in clinic   [facility]

## 2014-12-16 NOTE — Progress Notes (Signed)
With reference to discharging pt, I am unable to print the AVS because the med rec has not been completed.  Therefore, I am unable to discharge to pt.  I have "text" paged Dr. Hartford Poli x2 with no response.  I then phone paged Dr. Hartford Poli at both [phone] and [phone] with no response.  This entire process has transpired during approximately 2 hours.  Pt's family members are at bedside awaiting discharge paperwork so that they may leave, however, are upset that they cannot leave.  Still waiting for physician's response.

## 2014-12-16 NOTE — Care Management Note (Addendum)
Case Management Note  Patient Details  Name: Penny Curry MRN: [id number] Date of Birth: Jul 31, 1940  Subjective/Objective:    74 y.o. F admitted on 12/09/2014 with COPD Exacerbation. Developed AF on the day she was to be discharged. Placed on Eliquis. Receives Home Oxygen through [facility] and she is aware that family will need to bring tank when she is discharged for ride home.                 Action/Plan:Will be discharged today home with family. Given 30day coupon for Eliquis and informed of her copay ($95.00). Pt was also given a [employer] form from [employer] that she can have her MD complete and fax to the company for additional assistance if she qualifies. I asked the pt to make her MD aware if she is unable to afford this med and to discuss alternatives as not taking an anticoagulant IS NOT an OPTION. Pt verbalized understanding.    Expected Discharge Date:                  Expected Discharge Plan:  Home/Self Care  In-House Referral:     Discharge planning Services  CM Consult  Post Acute Care Choice:    Choice offered to:     DME Arranged:  Oxygen DME Agency:  Other - Comment ([place])  HH Arranged:    HH Agency:     Status of Service:  Completed, signed off  Medicare Important Message Given:  Yes-second notification given Date Medicare IM Given:    Medicare IM give by:    Date Additional Medicare IM Given:    Additional Medicare Important Message give by:     If discussed at [place] of Stay Meetings, dates discussed:    Additional Comments:  Delrae Sawyers, RN 12/16/2014, 11:00 AM

## 2014-12-16 NOTE — Progress Notes (Signed)
Inpatient Diabetes Program Recommendations  AACE/ADA: New Consensus Statement on Inpatient Glycemic Control (2015)  Target Ranges:  Prepandial:   less than 140 mg/dL      Peak postprandial:   less than 180 mg/dL (1-2 hours)      Critically ill patients:  140 - 180 mg/dL   Review of glycemic control  Results for Penny Curry, Penny Curry (MRN [id number]) as of 12/16/2014 08:11  Ref. Range 12/15/2014 05:38 12/15/2014 11:06 12/15/2014 16:11 12/15/2014 21:17 12/16/2014 06:40  Glucose-Capillary Latest Ref Range: 65-99 mg/dL 248 (H) 286 (H) 281 (H) 237 (H) 206 (H)   Diabetes history: DM 2 Outpatient Diabetes medications: Levemir 40 units, Humalog 11 units TID meal coverage Current orders for Inpatient glycemic control: Levemir 50 Daily, Novolog Moderate TID and hs, Novolog 8 units tid with meals  Inpatient Diabetes Program Recommendations: Fasting blood sugar 206mg /dl please consider increasing basal insulin to Levemir 55 units qday. Titrate down as steroids are decreased.  Patient takes 11 units meal coverage at home. Please consider increasing Novolog to 10 units TID meal coverage- continue Novolog correction as ordered  Gentry Fitz, RN, [place], , CDE Diabetes Coordinator Inpatient Diabetes Program  [phone] (Team Pager) [phone] ([place]) 12/16/2014 8:12 AM

## 2014-12-16 NOTE — Progress Notes (Signed)
Orders received for pt discharge.  Discharge summary printed and reviewed with pt.  Explained medication regimen, and pt had no further questions at this time.  IV removed and site remains clean, dry, intact.  Telemetry removed.  Pt in stable condition and awaiting transport. 

## 2014-12-19 DIAGNOSIS — J449 Chronic obstructive pulmonary disease, unspecified: Secondary | ICD-10-CM | POA: Diagnosis not present

## 2014-12-21 DIAGNOSIS — J8410 Pulmonary fibrosis, unspecified: Secondary | ICD-10-CM | POA: Diagnosis not present

## 2014-12-21 DIAGNOSIS — I270 Primary pulmonary hypertension: Secondary | ICD-10-CM | POA: Diagnosis not present

## 2014-12-21 DIAGNOSIS — E119 Type 2 diabetes mellitus without complications: Secondary | ICD-10-CM | POA: Diagnosis not present

## 2014-12-21 DIAGNOSIS — K219 Gastro-esophageal reflux disease without esophagitis: Secondary | ICD-10-CM | POA: Diagnosis not present

## 2014-12-21 DIAGNOSIS — I10 Essential (primary) hypertension: Secondary | ICD-10-CM | POA: Diagnosis not present

## 2014-12-21 DIAGNOSIS — I509 Heart failure, unspecified: Secondary | ICD-10-CM | POA: Diagnosis not present

## 2014-12-23 ENCOUNTER — Encounter: Payer: Self-pay | Admitting: *Deleted

## 2014-12-23 ENCOUNTER — Ambulatory Visit (HOSPITAL_COMMUNITY)
Admit: 2014-12-23 | Discharge: 2014-12-23 | Disposition: A | Discharge status: Home / Self Care | Payer: Medicare Other | Source: Ambulatory Visit | Attending: Internal Medicine | Admitting: Internal Medicine

## 2014-12-23 ENCOUNTER — Encounter (HOSPITAL_COMMUNITY): Payer: Self-pay | Admitting: Internal Medicine

## 2014-12-23 VITALS — BP 124/64 | HR 82 | Wt 189.2 lb

## 2014-12-23 DIAGNOSIS — Z79899 Other long term (current) drug therapy: Secondary | ICD-10-CM | POA: Diagnosis not present

## 2014-12-23 DIAGNOSIS — F329 Major depressive disorder, single episode, unspecified: Secondary | ICD-10-CM | POA: Diagnosis not present

## 2014-12-23 DIAGNOSIS — I110 Hypertensive heart disease with heart failure: Secondary | ICD-10-CM | POA: Insufficient documentation

## 2014-12-23 DIAGNOSIS — J9610 Chronic respiratory failure, unspecified whether with hypoxia or hypercapnia: Secondary | ICD-10-CM | POA: Insufficient documentation

## 2014-12-23 DIAGNOSIS — J9611 Chronic respiratory failure with hypoxia: Secondary | ICD-10-CM

## 2014-12-23 DIAGNOSIS — K219 Gastro-esophageal reflux disease without esophagitis: Secondary | ICD-10-CM | POA: Diagnosis not present

## 2014-12-23 DIAGNOSIS — Z8249 Family history of ischemic heart disease and other diseases of the circulatory system: Secondary | ICD-10-CM | POA: Insufficient documentation

## 2014-12-23 DIAGNOSIS — Z825 Family history of asthma and other chronic lower respiratory diseases: Secondary | ICD-10-CM | POA: Diagnosis not present

## 2014-12-23 DIAGNOSIS — E119 Type 2 diabetes mellitus without complications: Secondary | ICD-10-CM | POA: Diagnosis not present

## 2014-12-23 DIAGNOSIS — J449 Chronic obstructive pulmonary disease, unspecified: Secondary | ICD-10-CM | POA: Insufficient documentation

## 2014-12-23 DIAGNOSIS — Z7902 Long term (current) use of antithrombotics/antiplatelets: Secondary | ICD-10-CM | POA: Diagnosis not present

## 2014-12-23 DIAGNOSIS — E785 Hyperlipidemia, unspecified: Secondary | ICD-10-CM | POA: Insufficient documentation

## 2014-12-23 DIAGNOSIS — D860 Sarcoidosis of lung: Secondary | ICD-10-CM | POA: Diagnosis not present

## 2014-12-23 DIAGNOSIS — Z87891 Personal history of nicotine dependence: Secondary | ICD-10-CM | POA: Insufficient documentation

## 2014-12-23 DIAGNOSIS — Z833 Family history of diabetes mellitus: Secondary | ICD-10-CM | POA: Diagnosis not present

## 2014-12-23 DIAGNOSIS — I5032 Chronic diastolic (congestive) heart failure: Secondary | ICD-10-CM | POA: Insufficient documentation

## 2014-12-23 DIAGNOSIS — I480 Paroxysmal atrial fibrillation: Secondary | ICD-10-CM | POA: Diagnosis not present

## 2014-12-23 DIAGNOSIS — Z7982 Long term (current) use of aspirin: Secondary | ICD-10-CM | POA: Diagnosis not present

## 2014-12-23 DIAGNOSIS — Z006 Encounter for examination for normal comparison and control in clinical research program: Secondary | ICD-10-CM

## 2014-12-23 LAB — BASIC METABOLIC PANEL
ANION GAP: 11 (ref 5–15)
BUN: 36 mg/dL — AB (ref 6–20)
CHLORIDE: 91 mmol/L — AB (ref 101–111)
CO2: 30 mmol/L (ref 22–32)
Calcium: 9.4 mg/dL (ref 8.9–10.3)
Creatinine, Ser: 1.13 mg/dL — ABNORMAL HIGH (ref 0.44–1.00)
GFR calc Af Amer: 54 mL/min — ABNORMAL LOW (ref >60)
GFR calc non Af Amer: 47 mL/min — ABNORMAL LOW (ref >60)
Glucose, Bld: 185 mg/dL — ABNORMAL HIGH (ref 65–99)
POTASSIUM: 3.6 mmol/L (ref 3.5–5.1)
SODIUM: 132 mmol/L — AB (ref 135–145)

## 2014-12-23 LAB — BRAIN NATRIURETIC PEPTIDE: B NATRIURETIC PEPTIDE 5: 287.3 pg/mL — AB (ref 0.0–100.0)

## 2014-12-23 NOTE — Progress Notes (Signed)
ADVANCED HF CLINIC NOTE  Patient ID: SIEDAH SEDOR, female   DOB: 1940-12-19, 74 y.o.   MRN: [id number] PCP: Kennith Maes, MD Di Kindle Referring Physician: Lamonte Sakai Primary Cardiologist: Berta Denson  HPI:   74 y/o woman with previous tobacco use (quit 1969), DM2, HTN, obesity, HL, diastolic HF, pulmonary sarcoid with resultant ILD with chronic respiratory failure on home O2 referred by Dr. Lamonte Sakai for further evaluation of diastolic HF and PAH.   Underwent FOB and Bx in 2005, treated with pred and cytoxan in the past. Then had nodules on arm that were bx and showed probable sarcoidosis Summer 2011 (HP). Currently on Stiolto.   She was hospitalized at [facility]  Summer of 7412 for diastolic HF (had echo at [facility] with reportedly normal EF) and then again 7/18-7/20 at [facility] for CP and volume overload. Myoview normal. Again admitted 9/2-05/2014 with cellulitis. Lasix recently switched to torsemide with good result.   Was admitted in October for COPD flare. Prednisone now at 40 daily. Tapering back to home dose of 20. Discharge weight was 185 pounds. While in hospital torsemide cut back from 50 bid to 50 daily due to worsening renal function. Also had PAD and converted spontaneously to NSR. On Eliquis and diltiazem. Breathing better but still SOB with exertion. No edema, orthopnea or PND.   Echo 6/15: LVEF 87-86% normal diastolic filling pattern. RV normal. No significant TR.  Mild AS and moderate MS (mean grad 6) Myoview 7/16: EF 66% normal   Review of Systems: [y] = yes, [ ]  = no   General: Weight gain Blue.Reese ]; Weight loss [ ] ; Anorexia [ ] ; Fatigue [y; Fever [ ] ; Chills [ ] ; Weakness [ ]   Cardiac: Chest pain/pressure [ ] ; Resting SOB [ ] ; Exertional SOB Blue.Reese ]; Orthopnea [ ] ; Pedal Edema Blue.Reese ]; Palpitations [ ] ; Syncope [ ] ; Presyncope [ ] ; Paroxysmal nocturnal dyspnea[ ]   Pulmonary: Cough [ y]; Wheezing[ ] ; Hemoptysis[ ] ; Sputum [ ] ; Snoring [ ]   GI: Vomiting[ ] ; Dysphagia[ ] ; Melena[ ] ; Hematochezia [ ] ;  Heartburn[ ] ; Abdominal pain [ ] ; Constipation [ ] ; Diarrhea [ ] ; BRBPR [ ]   GU: Hematuria[ ] ; Dysuria [ ] ; Nocturia[ ]   Vascular: Pain in legs with walking [ ] ; Pain in feet with lying flat [ ] ; Non-healing sores [ ] ; Stroke [ ] ; TIA [ ] ; Slurred speech [ ] ;  Neuro: Headaches[ ] ; Vertigo[ ] ; Seizures[ ] ; Paresthesias[ ] ;Blurred vision [ ] ; Diplopia [ ] ; Vision changes [ ]   Ortho/Skin: Arthritis [ y]; Joint pain Blue.Reese ]; Muscle pain [ ] ; Joint swelling [ ] ; Back Pain Blue.Reese ]; Rash [ y]  Psych: Depression[ ] ; Anxiety[ ]   Heme: Bleeding problems [ ] ; Clotting disorders [ ] ; Anemia Blue.Reese ]  Endocrine: Diabetes Blue.Reese ]; Thyroid dysfunction[ ]    Past Medical History  Diagnosis Date  . HTN (hypertension)   . Diabetes mellitus   . Fibrosis of lung ([place])   . CHF (congestive heart failure) ([place])   . GERD (gastroesophageal reflux disease)   . Chronic headache     migraines  . UTI (urinary tract infection)     " MULTIPLE TIMES THIS PAST YEAR "  . Arthritis   . Anemia   . Cataract   . COPD (chronic obstructive pulmonary disease) ([place])   . Depression   . Osteoporosis   . Hyperlipidemia     Current Outpatient Prescriptions  Medication Sig Dispense Refill  . albuterol (PROVENTIL) (2.5 MG/3ML) 0.083% nebulizer solution Take 3 mLs (  2.5 mg total) by nebulization 3 (three) times daily. 270 mL 11  . apixaban (ELIQUIS) 5 MG TABS tablet Take 1 tablet (5 mg total) by mouth 2 (two) times daily. 60 tablet 0  . aspirin 81 MG chewable tablet Chew 1 tablet (81 mg total) by mouth daily. 30 tablet 0  . Benzonatate (TESSALON PO) Take 1 capsule by mouth daily as needed (cough).    . citalopram (CELEXA) 40 MG tablet Take 40 mg by mouth at bedtime.     . Colesevelam HCl 3.75 G PACK Take 1 packet by mouth every evening.    . diltiazem (CARDIZEM CD) 180 MG 24 hr capsule Take 1 capsule (180 mg total) by mouth daily. 30 capsule 0  . divalproex (DEPAKOTE ER) 500 MG 24 hr tablet Take 1,000 mg by mouth at bedtime.    .  ergocalciferol (VITAMIN D2) 50000 UNITS capsule Take 50,000 Units by mouth every Friday.    . fluticasone (FLONASE) 50 MCG/ACT nasal spray Use 2 sprays in each  nostril daily (Patient taking differently: Use 2 sprays in each  nostril twice daily) 48 g 1  . gabapentin (NEURONTIN) 300 MG capsule Take 300 mg by mouth at bedtime.    Marland Kitchen HUMALOG KWIKPEN 100 UNIT/ML KiwkPen Inject 11 Units into the skin 3 (three) times daily.     Marland Kitchen LEVEMIR FLEXTOUCH 100 UNIT/ML Pen Inject 40 Units into the skin daily with breakfast.     . levofloxacin (LEVAQUIN) 750 MG tablet Take 1 tablet (750 mg total) by mouth every other day. Starting 12/16/14 for 3 more doses 3 tablet 0  . loratadine (CLARITIN) 10 MG tablet Take 1 tablet (10 mg total) by mouth daily. (Patient taking differently: Take 10 mg by mouth daily as needed for allergies or rhinitis. ) 30 tablet 11  . metolazone (ZAROXOLYN) 2.5 MG tablet Take 1 tablet (2.5 mg total) by mouth as needed (for weight 203 or greater). (Patient taking differently: Take 2.5 mg by mouth as needed (for weight 195 or greater). ) 20 tablet 3  . ondansetron (ZOFRAN-ODT) 8 MG disintegrating tablet Take 8 mg by mouth every 6 (six) hours as needed for nausea or vomiting.     . OXYGEN Inhale 3 L into the lungs continuous.    . pantoprazole (PROTONIX) 40 MG tablet Take 40 mg by mouth daily.     . potassium chloride (KLOR-CON) 10 MEQ CR tablet Take 20 mEq by mouth daily before breakfast.     . predniSONE (DELTASONE) 20 MG tablet Take 2 tablets twice daily for 5 days, then take 2 tablets once daily for 5 days and then one tablet once daily as before. 60 tablet 1  . rOPINIRole (REQUIP) 3 MG tablet Take 3 mg by mouth 2 (two) times daily.      Marland Kitchen spironolactone (ALDACTONE) 25 MG tablet Take 25 mg by mouth 2 (two) times daily.     Marland Kitchen STIOLTO RESPIMAT 2.5-2.5 MCG/ACT AERS Inhale 2 puffs into the  lungs daily 12 g 1  . tetrahydrozoline-zinc (VISINE-AC) 0.05-0.25 % ophthalmic solution Place 1 drop into both  eyes daily as needed (dry eyes).    . torsemide (DEMADEX) 100 MG tablet Take 50 mg by mouth 2 (two) times daily.    . traMADol (ULTRAM) 50 MG tablet Take 50-100 mg by mouth 2 (two) times daily. Takes 1 tablet in the morning and 2 tablets at night    . PROAIR HFA 108 (90 BASE) MCG/ACT inhaler USE 2 PUFFS by mouth  EVERY  6 HOURS AS NEEDED FOR  WHEEZING OR SHORTNESS OF  BREATH 17 g 5   No current facility-administered medications for this encounter.     Social History   Social History  . Marital Status: Widowed    Spouse Name: N/A  . Number of Children: 4  . Years of Education: N/A   Occupational History  . disabled    Social History Main Topics  . Smoking status: Former Smoker -- 1.00 packs/day for 8 years    Types: Cigarettes    Quit date: 02/20/1968  . Smokeless tobacco: Never Used  . Alcohol Use: No  . Drug Use: No  . Sexual Activity: Not Currently   Other Topics Concern  . Not on file   Social History Narrative      Family History  Problem Relation Age of Onset  . Prostate cancer Brother   . Heart disease Brother     MI at age 36  . Stroke Mother     stroke at age 71  . Diabetes Mother   . Asthma Son   . Emphysema Father   . Heart disease Father   . Lung cancer Sister   . Heart disease Sister     MI at 81  . Heart disease Brother     all three brothers have had MI    Filed Vitals:   12/23/14 0932  BP: 124/64  Pulse: 82  Weight: 189 lb 4 oz (85.843 kg)  SpO2: 98%    PHYSICAL EXAM: General:  Elderly woman on oxygen  No respiratory difficulty HEENT: normal Neck: supple. thick. Carotids 2+ bilat; no bruits. No lymphadenopathy or thryomegaly appreciated. Cor: PMI nondisplaced. Regular rate & rhythm. 2/6 AS Lungs: clear with decrease BS throughout Abdomen:obese  soft, nontender, nondistended. No hepatosplenomegaly. No bruits or masses. Good bowel sounds. Extremities: no cyanosis, clubbing, rash, no edema Neuro: alert & oriented x 3, cranial nerves  grossly intact. moves all 4 extremities w/o difficulty. Affect pleasant.   ASSESSMENT & PLAN: 1. Chronic diastolic HF 2. Chronic respiratory failure 3. Pulmonary sarcoid  4. HTN  5. Morbid obesity 6. COPD 7. PAF  -- ChadsVasc = 4  Symptoms are multifactorial. She clearly has severe underlying lung disease but also has component of diastolic HF. Despite her pulmonary sarcoid previous echos have not suggested PAH or RV dysfunction.   She is stable after recent admit for COPD flare. Torsemide initially cut back to 50 daily but now taking 50 bid. Will continue current regimen. Check labs. If renal function worsening cut torsemide to 50/20.  Continue Eliquis and diltiazem for recently diagnosed PAF.  Seyed Heffley,MD 10:10 PM

## 2014-12-23 NOTE — Progress Notes (Signed)
STATUS FIRST Informed Consent    Subject Name:Penny Curry  Subject met inclusion and exclusion criteria.The informed consent form, study requirements and expectations were reviewed with the subject and questions and concerns were addressed prior to the signing of the consent form. The subject verbalized understanding of the trail requirements. The subject agreed to participate in the STATUS FIRST research study and signed the informed consent at 9:24 am on 12-23-14.The informed consent was obtained prior to performance of any protocol-specific procedures for the subject. A copy of the signed informed consent was given to the subject and a copy was placed in the subjects's medical record.    [name] 12/23/14  9:24 am

## 2014-12-23 NOTE — Patient Instructions (Addendum)
Routine lab work today. (bmet bnp) Will notify you of abnormal results   FOLLOW UP: 2-3 months w/ Dr.Bensimhon

## 2014-12-24 DIAGNOSIS — J8410 Pulmonary fibrosis, unspecified: Secondary | ICD-10-CM | POA: Diagnosis not present

## 2014-12-24 DIAGNOSIS — I270 Primary pulmonary hypertension: Secondary | ICD-10-CM | POA: Diagnosis not present

## 2014-12-24 DIAGNOSIS — I10 Essential (primary) hypertension: Secondary | ICD-10-CM | POA: Diagnosis not present

## 2014-12-24 DIAGNOSIS — I509 Heart failure, unspecified: Secondary | ICD-10-CM | POA: Diagnosis not present

## 2014-12-24 DIAGNOSIS — E119 Type 2 diabetes mellitus without complications: Secondary | ICD-10-CM | POA: Diagnosis not present

## 2014-12-24 DIAGNOSIS — K219 Gastro-esophageal reflux disease without esophagitis: Secondary | ICD-10-CM | POA: Diagnosis not present

## 2014-12-26 ENCOUNTER — Other Ambulatory Visit: Payer: Self-pay | Admitting: Emergency Medicine

## 2014-12-27 DIAGNOSIS — J8410 Pulmonary fibrosis, unspecified: Secondary | ICD-10-CM | POA: Diagnosis not present

## 2014-12-27 DIAGNOSIS — I509 Heart failure, unspecified: Secondary | ICD-10-CM | POA: Diagnosis not present

## 2014-12-27 DIAGNOSIS — K219 Gastro-esophageal reflux disease without esophagitis: Secondary | ICD-10-CM | POA: Diagnosis not present

## 2014-12-27 DIAGNOSIS — I270 Primary pulmonary hypertension: Secondary | ICD-10-CM | POA: Diagnosis not present

## 2014-12-27 DIAGNOSIS — I10 Essential (primary) hypertension: Secondary | ICD-10-CM | POA: Diagnosis not present

## 2014-12-27 DIAGNOSIS — E119 Type 2 diabetes mellitus without complications: Secondary | ICD-10-CM | POA: Diagnosis not present

## 2014-12-31 ENCOUNTER — Ambulatory Visit (INDEPENDENT_AMBULATORY_CARE_PROVIDER_SITE_OTHER): Payer: Medicare Other | Admitting: Physician Assistant

## 2014-12-31 ENCOUNTER — Encounter: Payer: Self-pay | Admitting: Physician Assistant

## 2014-12-31 ENCOUNTER — Ambulatory Visit (INDEPENDENT_AMBULATORY_CARE_PROVIDER_SITE_OTHER): Payer: Medicare Other

## 2014-12-31 VITALS — BP 134/68 | HR 93 | Temp 98.6°F | Resp 16 | Ht 65.0 in | Wt 191.0 lb

## 2014-12-31 DIAGNOSIS — R05 Cough: Secondary | ICD-10-CM | POA: Diagnosis not present

## 2014-12-31 DIAGNOSIS — M25551 Pain in right hip: Secondary | ICD-10-CM

## 2014-12-31 DIAGNOSIS — M545 Low back pain, unspecified: Secondary | ICD-10-CM

## 2014-12-31 DIAGNOSIS — E1165 Type 2 diabetes mellitus with hyperglycemia: Secondary | ICD-10-CM | POA: Diagnosis not present

## 2014-12-31 DIAGNOSIS — R739 Hyperglycemia, unspecified: Secondary | ICD-10-CM

## 2014-12-31 DIAGNOSIS — W19XXXA Unspecified fall, initial encounter: Secondary | ICD-10-CM | POA: Diagnosis not present

## 2014-12-31 DIAGNOSIS — Y92099 Unspecified place in other non-institutional residence as the place of occurrence of the external cause: Secondary | ICD-10-CM

## 2014-12-31 DIAGNOSIS — Y92009 Unspecified place in unspecified non-institutional (private) residence as the place of occurrence of the external cause: Principal | ICD-10-CM

## 2014-12-31 DIAGNOSIS — R053 Chronic cough: Secondary | ICD-10-CM

## 2014-12-31 DIAGNOSIS — Z794 Long term (current) use of insulin: Secondary | ICD-10-CM | POA: Diagnosis not present

## 2014-12-31 LAB — GLUCOSE, POCT (MANUAL RESULT ENTRY): POC GLUCOSE: 306 mg/dL — AB (ref 70–99)

## 2014-12-31 NOTE — Progress Notes (Signed)
[facility] [address] [place] [address] [phone]  Date:  12/31/2014   Name:  Penny Curry   DOB:  1940/03/31   MRN:  [id number]  PCP:  Ronita Hipps, MD    Chief Complaint: Fall   History of Present Illness:  This is a 74 y.o. female with PMH pulm HTN, sarcoidosis, interstitial lung dz, glucocorticoid dependent, DM2, diastolic HF, HTN who is presenting with her daughter for eval after a fall at home. She lives with her granddaughter and her daughter lives next door. While she was home alone she tried getting in the shower alone and fell. She was unable to get up and sat in the tub for 4 hours. She states her lower back and right hip are hurting her now. She did hit the back of her head on the tub but did not lose consciousness. No lacerations but some bruising on left abdomen. She does take eliquis daily - she was recently started on this d/t PAF during recent hosp admission. She denies n/v, blurred vision, weakness, numbness, facial asymmetry, speech difficulty. She has not had any fevers. She is recovering from recent hospitalization for COPD exacerbation. She is able to walk and bear weight. She uses a cane regularly. Over the past month she has had a few falls. She states she wants to do something so she does it. She knows she should wait until someone comes to help. Daughter states home needs to be decluttered to prevent falls. The bathtub is not a walk-in tub. They went and got a shower chair today. They have a home health nurse that comes three times a week.  Recent 7 days hosp admission 10/20 - 10/27 for COPD exacerbation and PAF. She was discharged on levaquin. She uses oxygen 3L continuous. She has pulmonologist who she last saw 10/6. Next appt 11/17. She has chronic cough. She coughed in office a few times but daughter states "that's normal". Pt states she does not feel bad except for pain in back and hip.   DM uncontrolled: Daughter states glucose  stays high near 300 d/t glucocorticoid dependence. She takes pred 20 mg QD. She takes levemir 40 mg QAM and humalog 11 units TID with meals. She has been instructed by PCP to take an additional 10 units if bg reaches 300-320.   PCP: Dr. Helene Kelp. No upcoming appt.  Review of Systems:  Review of Systems See HPI  Patient Active Problem List   Diagnosis Date Noted  . PAF (paroxysmal atrial fibrillation) ([place])   . Controlled type 2 diabetes mellitus with hyperglycemia, with long-term current use of insulin ([place])   . COPD exacerbation ([place]) 12/10/2014  . Chronic diastolic heart failure ([place]) 11/04/2014  . Cellulitis of left leg   . Obesity 10/23/2014  . Cellulitis 10/22/2014  . Chronic respiratory failure with hypoxia ([place])   . Unstable angina ([place])   . Anemia 07/07/2014  . Bronchitis 10/27/2012  . Cor pulmonale ([place]) 05/05/2012  . Acute-on-chronic respiratory failure ([place]) 04/17/2012  . Hypoxia 04/17/2012  . CKD (chronic kidney disease) stage 2, GFR 60-89 ml/min 04/17/2012  . Abnormal TSH 04/17/2012  . Chest pain, unspecified 04/17/2012  . Acute bronchitis 04/17/2012  . Metabolic encephalopathy 0000000  . Dehydration with hyponatremia 04/16/2012  . Rectal bleeding 03/25/2012  . Dysphagia 03/25/2012  . Hemorrhoids 03/25/2012  . Asthma 05/23/2011  . Sinusitis 04/19/2011  . Chronic cough 01/25/2011  . Allergic rhinitis, seasonal 06/23/2010  . INTERSTITIAL LUNG DISEASE  01/27/2010  . HEADACHE, CHRONIC 01/27/2010  . PULMONARY SARCOIDOSIS 01/27/2010  . Pulmonary hypertension ([place]) 01/27/2010  . Diabetes mellitus type 2, controlled ([place]) 01/26/2010  . Essential hypertension 01/26/2010    Prior to Admission medications   Medication Sig Start Date End Date Taking? Authorizing Provider  albuterol (PROVENTIL) (2.5 MG/3ML) 0.083% nebulizer solution Take 3 mLs (2.5 mg total) by nebulization 3 (three) times daily. 09/14/14  Yes Collene Gobble, MD  apixaban (ELIQUIS) 5 MG TABS tablet Take  1 tablet (5 mg total) by mouth 2 (two) times daily. 12/15/14  Yes Verlee Monte, MD  aspirin 81 MG chewable tablet Chew 1 tablet (81 mg total) by mouth daily. 09/07/14  Yes Lucious Groves, DO  Benzonatate (TESSALON PO) Take 1 capsule by mouth daily as needed (cough).   Yes Historical Provider, MD  citalopram (CELEXA) 40 MG tablet Take 40 mg by mouth at bedtime.    Yes Historical Provider, MD  Colesevelam HCl 3.75 G PACK Take 1 packet by mouth every evening.   Yes Historical Provider, MD  diltiazem (CARDIZEM CD) 180 MG 24 hr capsule Take 1 capsule (180 mg total) by mouth daily. 12/15/14  Yes Verlee Monte, MD  divalproex (DEPAKOTE ER) 500 MG 24 hr tablet Take 1,000 mg by mouth at bedtime.   Yes Historical Provider, MD  ergocalciferol (VITAMIN D2) 50000 UNITS capsule Take 50,000 Units by mouth every Friday.   Yes Historical Provider, MD  fluticasone (FLONASE) 50 MCG/ACT nasal spray Use 2 sprays in each  nostril daily Patient taking differently: Use 2 sprays in each  nostril twice daily 11/22/14  Yes Collene Gobble, MD  gabapentin (NEURONTIN) 300 MG capsule Take 300 mg by mouth at bedtime.   Yes Historical Provider, MD  LEVEMIR FLEXTOUCH 100 UNIT/ML Pen Inject 40 Units into the skin daily with breakfast.  08/09/14  Yes Historical Provider, MD  levofloxacin (LEVAQUIN) 750 MG tablet Take 1 tablet (750 mg total) by mouth every other day. Starting 12/16/14 for 3 more doses 12/14/14  Yes Bonnielee Haff, MD  metolazone (ZAROXOLYN) 2.5 MG tablet Take 1 tablet (2.5 mg total) by mouth as needed (for weight 203 or greater). 11/04/14  Yes Shaune Pascal Bensimhon, MD  ondansetron (ZOFRAN-ODT) 8 MG disintegrating tablet Take 8 mg by mouth every 6 (six) hours as needed for nausea or vomiting.  11/26/14  Yes Historical Provider, MD  pantoprazole (PROTONIX) 40 MG tablet Take 40 mg by mouth daily.    Yes Historical Provider, MD  potassium chloride (KLOR-CON) 10 MEQ CR tablet Take 20 mEq by mouth daily before breakfast.    Yes  Historical Provider, MD  predniSONE (DELTASONE) 20 MG tablet Take 2 tablets twice daily for 5 days, then take 2 tablets once daily for 5 days and then one tablet once daily as before. 12/14/14  Yes Bonnielee Haff, MD  PROAIR HFA 108 (90 BASE) MCG/ACT inhaler USE 2 PUFFS by mouth EVERY  6 HOURS AS NEEDED FOR  WHEEZING OR SHORTNESS OF  BREATH 12/27/14  Yes Collene Gobble, MD  rOPINIRole (REQUIP) 3 MG tablet Take 3 mg by mouth 2 (two) times daily.     Yes Historical Provider, MD  spironolactone (ALDACTONE) 25 MG tablet Take 25 mg by mouth 2 (two) times daily.  11/12/13  Yes Historical Provider, MD  STIOLTO RESPIMAT 2.5-2.5 MCG/ACT AERS Inhale 2 puffs into the  lungs daily 11/22/14  Yes Collene Gobble, MD  tetrahydrozoline-zinc (VISINE-AC) 0.05-0.25 % ophthalmic solution Place 1 drop into  both eyes daily as needed (dry eyes).   Yes Historical Provider, MD  torsemide (DEMADEX) 100 MG tablet Take 50 mg by mouth 2 (two) times daily.   Yes Historical Provider, MD  traMADol (ULTRAM) 50 MG tablet Take 50-100 mg by mouth 2 (two) times daily. Takes 1 tablet in the morning and 2 tablets at night 05/23/11  Yes Tanda Rockers, MD  HUMALOG KWIKPEN 100 UNIT/ML KiwkPen Inject 11 Units into the skin 3 (three) times daily.  08/09/14   Historical Provider, MD  loratadine (CLARITIN) 10 MG tablet Take 1 tablet (10 mg total) by mouth daily. Patient not taking: Reported on 12/31/2014 02/05/14   Collene Gobble, MD  OXYGEN Inhale 3 L into the lungs continuous.   yes Historical Provider, MD    Past Surgical History  Procedure Laterality Date  . Neck surgery  1987  . Partial hysterectomy    . Tubal ligation    . Cataract extraction    . Abdominal hysterectomy      Social History  Substance Use Topics  . Smoking status: Former Smoker -- 1.00 packs/day for 8 years    Types: Cigarettes    Quit date: 02/20/1968  . Smokeless tobacco: Never Used  . Alcohol Use: No    Family History  Problem Relation Age of Onset  .  Prostate cancer Brother   . Heart disease Brother     MI at age 18  . Stroke Mother     stroke at age 21  . Diabetes Mother   . Asthma Son   . Emphysema Father   . Heart disease Father   . Lung cancer Sister   . Heart disease Sister     MI at 65  . Heart disease Brother     all three brothers have had MI    Medication list has been reviewed and updated.  Physical Examination:  Physical Exam  Constitutional: She is oriented to person, place, and time. She appears well-developed and well-nourished. No distress.  HENT:  Head: Normocephalic and atraumatic.  Right Ear: Hearing normal.  Left Ear: Hearing normal.  Nose: Nose normal.  Mouth/Throat: Uvula is midline, oropharynx is clear and moist and mucous membranes are normal.  No bruising, laceration, abrasion or tenderness on head  Eyes: Conjunctivae, EOM and lids are normal. Pupils are equal, round, and reactive to light. Right eye exhibits no discharge. Left eye exhibits no discharge. No scleral icterus.  Neck: Normal range of motion. No spinous process tenderness and no muscular tenderness present.  Cardiovascular: Normal rate, regular rhythm, normal heart sounds and normal pulses.   No murmur heard. Pulmonary/Chest: Effort normal and breath sounds normal. No respiratory distress. She has no wheezes. She has no rhonchi. She has no rales.  Abdominal: Soft.  Bruising over left abdomen. ttp over bruise, otherwise abdomen nontender  Musculoskeletal: Normal range of motion.       Right elbow: Normal.      Left elbow: Normal.       Right hip: She exhibits tenderness (with palpation over lateral hip. no pain anteriorly). She exhibits no swelling, no deformity and no laceration.       Cervical back: She exhibits normal range of motion and no tenderness.       Thoracic back: Normal.       Lumbar back: She exhibits bony tenderness. She exhibits no swelling, no deformity and no laceration.       Back:  Lymphadenopathy:  Head  (right side): No submental, no submandibular and no tonsillar adenopathy present.       Head (left side): No submental, no submandibular and no tonsillar adenopathy present.    She has no cervical adenopathy.  Neurological: She is alert and oriented to person, place, and time. She has normal strength. No cranial nerve deficit or sensory deficit. Gait (antalgic) abnormal.  Skin: Skin is warm, dry and intact. Bruising noted.  Psychiatric: She has a normal mood and affect. Her speech is normal and behavior is normal. Thought content normal.   BP 134/68 mmHg  Pulse 93  Temp(Src) 98.6 F (37 C) (Oral)  Resp 16  Ht 5\' 5"  (1.651 m)  Wt 191 lb (86.637 kg)  BMI 31.78 kg/m2  Radiograph lumbar IMPRESSION: Osseous demineralization with degenerative disc disease changes and scoliosis of lumbar spine.  No definite acute abnormalities as above.  Radiograph right hip IMPRESSION: 1. No acute osseous abnormality. 2. Mild osteoarthritis.  Results for orders placed or performed in visit on 12/31/14  POCT glucose (manual entry)  Result Value Ref Range   POC Glucose 306 (A) 70 - 99 mg/dl    Assessment and Plan:  1. Fall at home, initial encounter 2. Midline low back pain without sciatica 3. Right hip pain Suspect weakness and deconditioning d/t recent 1 week hospitalization, however pt had fallen prior to hospitalization as well. Patient is alert and oriented and neuro exam normal. Vitals normal. Radiographs without acute abnormalities. Suggested they make an appt with PCP, Dr. Helene Kelp, soon to discuss options. PT or home health may be good options. Pt does have home health come 3 times a week but there is still time during the day that she is home alone. This is always when pt gets in trouble, per daughter. They will make appt soon. Tylenol/ice for pain in the meantime.  - DG Lumbar Spine Complete; Future - DG HIP UNILAT W OR W/O PELVIS 2-3 VIEWS RIGHT; Future  4. Controlled type 2 diabetes  mellitus with hyperglycemia, with long-term current use of insulin ([place]) 5. Hyperglycemia  Glucose 306. PT and daughter state they do not give an extra 10 units humalog until sugar reaches 320 or so. Hyperglycemia d/t glucocorticoid dependence. Advised she take another 10 units when she goes home and again f/u with PCP to discuss better glucose control. - POCT glucose (manual entry)  6. Chronic cough  Pt has chronic cough, COPD, sarcoidosis, pulm htn. Daughter not overly worried about occ cough in exam room, stating "pretty normal". Vitals normal. Upcoming pulm appt in a few days - f/u with pulm.   Benjaman Pott Drenda Freeze, MHS [facility]  01/02/2015

## 2014-12-31 NOTE — Patient Instructions (Signed)
Follow up with pulmonology about your cough Follow up with Dr. Helene Kelp regarding your diabetes. Take it easy at home. You can take tylenol and apply ice. Don't get in shower alone.

## 2015-01-03 ENCOUNTER — Observation Stay (HOSPITAL_COMMUNITY)
Admission: EM | Admit: 2015-01-03 | Discharge: 2015-01-20 | Disposition: E | Payer: Medicare Other | Attending: Internal Medicine | Admitting: Internal Medicine

## 2015-01-03 ENCOUNTER — Emergency Department (HOSPITAL_COMMUNITY): Payer: Medicare Other

## 2015-01-03 ENCOUNTER — Encounter (HOSPITAL_COMMUNITY): Payer: Self-pay | Admitting: Emergency Medicine

## 2015-01-03 DIAGNOSIS — A419 Sepsis, unspecified organism: Secondary | ICD-10-CM | POA: Diagnosis present

## 2015-01-03 DIAGNOSIS — D869 Sarcoidosis, unspecified: Secondary | ICD-10-CM | POA: Diagnosis not present

## 2015-01-03 DIAGNOSIS — I110 Hypertensive heart disease with heart failure: Secondary | ICD-10-CM | POA: Diagnosis not present

## 2015-01-03 DIAGNOSIS — N182 Chronic kidney disease, stage 2 (mild): Secondary | ICD-10-CM

## 2015-01-03 DIAGNOSIS — Z515 Encounter for palliative care: Secondary | ICD-10-CM | POA: Diagnosis not present

## 2015-01-03 DIAGNOSIS — R509 Fever, unspecified: Secondary | ICD-10-CM | POA: Diagnosis not present

## 2015-01-03 DIAGNOSIS — I509 Heart failure, unspecified: Secondary | ICD-10-CM | POA: Diagnosis not present

## 2015-01-03 DIAGNOSIS — I10 Essential (primary) hypertension: Secondary | ICD-10-CM | POA: Diagnosis not present

## 2015-01-03 DIAGNOSIS — R739 Hyperglycemia, unspecified: Secondary | ICD-10-CM

## 2015-01-03 DIAGNOSIS — F32A Depression, unspecified: Secondary | ICD-10-CM

## 2015-01-03 DIAGNOSIS — Z8249 Family history of ischemic heart disease and other diseases of the circulatory system: Secondary | ICD-10-CM | POA: Insufficient documentation

## 2015-01-03 DIAGNOSIS — Z87891 Personal history of nicotine dependence: Secondary | ICD-10-CM | POA: Diagnosis not present

## 2015-01-03 DIAGNOSIS — I480 Paroxysmal atrial fibrillation: Secondary | ICD-10-CM | POA: Insufficient documentation

## 2015-01-03 DIAGNOSIS — J189 Pneumonia, unspecified organism: Secondary | ICD-10-CM | POA: Diagnosis not present

## 2015-01-03 DIAGNOSIS — I270 Primary pulmonary hypertension: Secondary | ICD-10-CM | POA: Diagnosis not present

## 2015-01-03 DIAGNOSIS — Z7901 Long term (current) use of anticoagulants: Secondary | ICD-10-CM | POA: Insufficient documentation

## 2015-01-03 DIAGNOSIS — F329 Major depressive disorder, single episode, unspecified: Secondary | ICD-10-CM | POA: Insufficient documentation

## 2015-01-03 DIAGNOSIS — E785 Hyperlipidemia, unspecified: Secondary | ICD-10-CM | POA: Diagnosis not present

## 2015-01-03 DIAGNOSIS — I129 Hypertensive chronic kidney disease with stage 1 through stage 4 chronic kidney disease, or unspecified chronic kidney disease: Secondary | ICD-10-CM | POA: Diagnosis not present

## 2015-01-03 DIAGNOSIS — I959 Hypotension, unspecified: Secondary | ICD-10-CM | POA: Insufficient documentation

## 2015-01-03 DIAGNOSIS — Z880 Allergy status to penicillin: Secondary | ICD-10-CM | POA: Insufficient documentation

## 2015-01-03 DIAGNOSIS — E1165 Type 2 diabetes mellitus with hyperglycemia: Secondary | ICD-10-CM | POA: Insufficient documentation

## 2015-01-03 DIAGNOSIS — J449 Chronic obstructive pulmonary disease, unspecified: Secondary | ICD-10-CM | POA: Diagnosis not present

## 2015-01-03 DIAGNOSIS — Y95 Nosocomial condition: Secondary | ICD-10-CM | POA: Diagnosis not present

## 2015-01-03 DIAGNOSIS — R069 Unspecified abnormalities of breathing: Secondary | ICD-10-CM | POA: Diagnosis not present

## 2015-01-03 DIAGNOSIS — J8410 Pulmonary fibrosis, unspecified: Secondary | ICD-10-CM | POA: Diagnosis not present

## 2015-01-03 DIAGNOSIS — I5032 Chronic diastolic (congestive) heart failure: Secondary | ICD-10-CM | POA: Insufficient documentation

## 2015-01-03 DIAGNOSIS — J9620 Acute and chronic respiratory failure, unspecified whether with hypoxia or hypercapnia: Secondary | ICD-10-CM | POA: Diagnosis present

## 2015-01-03 DIAGNOSIS — Z794 Long term (current) use of insulin: Secondary | ICD-10-CM | POA: Insufficient documentation

## 2015-01-03 DIAGNOSIS — Z7982 Long term (current) use of aspirin: Secondary | ICD-10-CM | POA: Insufficient documentation

## 2015-01-03 DIAGNOSIS — J9621 Acute and chronic respiratory failure with hypoxia: Secondary | ICD-10-CM | POA: Diagnosis not present

## 2015-01-03 DIAGNOSIS — E119 Type 2 diabetes mellitus without complications: Secondary | ICD-10-CM | POA: Diagnosis not present

## 2015-01-03 DIAGNOSIS — Z9981 Dependence on supplemental oxygen: Secondary | ICD-10-CM | POA: Diagnosis not present

## 2015-01-03 DIAGNOSIS — K219 Gastro-esophageal reflux disease without esophagitis: Secondary | ICD-10-CM | POA: Diagnosis not present

## 2015-01-03 DIAGNOSIS — Z7952 Long term (current) use of systemic steroids: Secondary | ICD-10-CM | POA: Insufficient documentation

## 2015-01-03 LAB — GLUCOSE, CAPILLARY
Glucose-Capillary: 509 mg/dL — ABNORMAL HIGH (ref 65–99)
Glucose-Capillary: 524 mg/dL — ABNORMAL HIGH (ref 65–99)

## 2015-01-03 LAB — COMPREHENSIVE METABOLIC PANEL
ALT: 33 U/L (ref 14–54)
ANION GAP: 16 — AB (ref 5–15)
AST: 35 U/L (ref 15–41)
Albumin: 3 g/dL — ABNORMAL LOW (ref 3.5–5.0)
Alkaline Phosphatase: 58 U/L (ref 38–126)
BILIRUBIN TOTAL: 0.9 mg/dL (ref 0.3–1.2)
BUN: 34 mg/dL — ABNORMAL HIGH (ref 6–20)
CO2: 21 mmol/L — ABNORMAL LOW (ref 22–32)
Calcium: 9.2 mg/dL (ref 8.9–10.3)
Chloride: 94 mmol/L — ABNORMAL LOW (ref 101–111)
Creatinine, Ser: 2.27 mg/dL — ABNORMAL HIGH (ref 0.44–1.00)
GFR calc Af Amer: 23 mL/min — ABNORMAL LOW (ref >60)
GFR, EST NON AFRICAN AMERICAN: 20 mL/min — AB (ref >60)
Glucose, Bld: 508 mg/dL — ABNORMAL HIGH (ref 65–99)
POTASSIUM: 4.8 mmol/L (ref 3.5–5.1)
Sodium: 131 mmol/L — ABNORMAL LOW (ref 135–145)
TOTAL PROTEIN: 5.7 g/dL — AB (ref 6.5–8.1)

## 2015-01-03 LAB — URINALYSIS, ROUTINE W REFLEX MICROSCOPIC
BILIRUBIN URINE: NEGATIVE
Hgb urine dipstick: NEGATIVE
KETONES UR: NEGATIVE mg/dL
LEUKOCYTES UA: NEGATIVE
Nitrite: NEGATIVE
PH: 5 (ref 5.0–8.0)
PROTEIN: NEGATIVE mg/dL
Specific Gravity, Urine: 1.027 (ref 1.005–1.030)
Urobilinogen, UA: 0.2 mg/dL (ref 0.0–1.0)

## 2015-01-03 LAB — MRSA PCR SCREENING: MRSA BY PCR: NEGATIVE

## 2015-01-03 LAB — INFLUENZA PANEL BY PCR (TYPE A & B)
H1N1 flu by pcr: NOT DETECTED
INFLAPCR: NEGATIVE
Influenza B By PCR: NEGATIVE

## 2015-01-03 LAB — CBC WITH DIFFERENTIAL/PLATELET
BASOS ABS: 0 10*3/uL (ref 0.0–0.1)
BASOS PCT: 1 %
EOS ABS: 0 10*3/uL (ref 0.0–0.7)
Eosinophils Relative: 0 %
HCT: 34.3 % — ABNORMAL LOW (ref 36.0–46.0)
Hemoglobin: 11.2 g/dL — ABNORMAL LOW (ref 12.0–15.0)
LYMPHS ABS: 3 10*3/uL (ref 0.7–4.0)
LYMPHS PCT: 61 %
MCH: 32.7 pg (ref 26.0–34.0)
MCHC: 32.7 g/dL (ref 30.0–36.0)
MCV: 100 fL (ref 78.0–100.0)
MONO ABS: 0.2 10*3/uL (ref 0.1–1.0)
Monocytes Relative: 5 %
NEUTROS ABS: 1.6 10*3/uL — AB (ref 1.7–7.7)
Neutrophils Relative %: 33 %
PLATELETS: 209 10*3/uL (ref 150–400)
RBC: 3.43 MIL/uL — ABNORMAL LOW (ref 3.87–5.11)
RDW: 15.5 % (ref 11.5–15.5)
WBC: 4.8 10*3/uL (ref 4.0–10.5)

## 2015-01-03 LAB — URINE MICROSCOPIC-ADD ON

## 2015-01-03 LAB — I-STAT TROPONIN, ED: TROPONIN I, POC: 0.05 ng/mL (ref 0.00–0.08)

## 2015-01-03 LAB — BRAIN NATRIURETIC PEPTIDE: B Natriuretic Peptide: 611.1 pg/mL — ABNORMAL HIGH (ref 0.0–100.0)

## 2015-01-03 LAB — PROCALCITONIN: PROCALCITONIN: 45.7 ng/mL

## 2015-01-03 LAB — LACTIC ACID, PLASMA: LACTIC ACID, VENOUS: 12.9 mmol/L — AB (ref 0.5–2.0)

## 2015-01-03 MED ORDER — DEXTROSE 5 % IV SOLN
1.0000 g | Freq: Three times a day (TID) | INTRAVENOUS | Status: DC
  Administered 2015-01-03: 1 g via INTRAVENOUS
  Filled 2015-01-03 (×3): qty 1

## 2015-01-03 MED ORDER — ONDANSETRON HCL 4 MG PO TABS: 4.0000 mg | ORAL_TABLET | Freq: Four times a day (QID) | ORAL | Status: DC | PRN

## 2015-01-03 MED ORDER — VANCOMYCIN HCL IN DEXTROSE 1-5 GM/200ML-% IV SOLN
1000.0000 mg | Freq: Once | INTRAVENOUS | Status: DC
  Filled 2015-01-03: qty 200

## 2015-01-03 MED ORDER — VANCOMYCIN HCL IN DEXTROSE 1-5 GM/200ML-% IV SOLN: 1000.0000 mg | INTRAVENOUS | Status: DC

## 2015-01-03 MED ORDER — SODIUM CHLORIDE 0.9 % IJ SOLN: 3.0000 mL | Freq: Two times a day (BID) | INTRAMUSCULAR | Status: DC

## 2015-01-03 MED ORDER — DEXTROSE 5 % IV SOLN
2.0000 g | Freq: Once | INTRAVENOUS | Status: DC
  Filled 2015-01-03: qty 2

## 2015-01-03 MED ORDER — SODIUM CHLORIDE 0.9 % IV SOLN: INTRAVENOUS | Status: DC

## 2015-01-03 MED ORDER — VANCOMYCIN HCL IN DEXTROSE 1-5 GM/200ML-% IV SOLN: 1000.0000 mg | Freq: Once | INTRAVENOUS | Status: DC

## 2015-01-03 MED ORDER — SODIUM CHLORIDE 0.9 % IV BOLUS (SEPSIS)
1000.0000 mL | INTRAVENOUS | Status: DC
Start: 2015-01-03 — End: 2015-01-03
  Administered 2015-01-03: 1000 mL via INTRAVENOUS

## 2015-01-03 MED ORDER — VANCOMYCIN HCL 10 G IV SOLR
1500.0000 mg | Freq: Once | INTRAVENOUS | Status: AC
  Administered 2015-01-03: 1500 mg via INTRAVENOUS
  Filled 2015-01-03: qty 1500

## 2015-01-03 MED ORDER — IPRATROPIUM-ALBUTEROL 0.5-2.5 (3) MG/3ML IN SOLN
3.0000 mL | RESPIRATORY_TRACT | Status: DC
  Administered 2015-01-03 (×2): 3 mL via RESPIRATORY_TRACT
  Filled 2015-01-03 (×2): qty 3

## 2015-01-03 MED ORDER — SODIUM CHLORIDE 0.9 % IV BOLUS (SEPSIS): 1000.0000 mL | Freq: Once | INTRAVENOUS | Status: DC

## 2015-01-03 MED ORDER — HYOSCYAMINE SULFATE 0.125 MG/ML PO SOLN
0.2500 mg | ORAL | Status: DC | PRN
  Filled 2015-01-03 (×2): qty 2

## 2015-01-03 MED ORDER — HYDROCODONE-ACETAMINOPHEN 5-325 MG PO TABS: 1.0000 | ORAL_TABLET | ORAL | Status: DC | PRN

## 2015-01-03 MED ORDER — ACETAMINOPHEN 650 MG RE SUPP: 650.0000 mg | Freq: Four times a day (QID) | RECTAL | Status: DC | PRN

## 2015-01-03 MED ORDER — DIVALPROEX SODIUM ER 500 MG PO TB24: 1000.0000 mg | ORAL_TABLET | Freq: Every day | ORAL | Status: DC

## 2015-01-03 MED ORDER — SODIUM CHLORIDE 0.9 % IV SOLN
INTRAVENOUS | Status: DC
  Administered 2015-01-03: 14:00:00 via INTRAVENOUS

## 2015-01-03 MED ORDER — ACETAMINOPHEN 650 MG RE SUPP
650.0000 mg | Freq: Once | RECTAL | Status: AC
  Administered 2015-01-03: 650 mg via RECTAL
  Filled 2015-01-03: qty 1

## 2015-01-03 MED ORDER — ACETAMINOPHEN 325 MG PO TABS: 650.0000 mg | ORAL_TABLET | Freq: Four times a day (QID) | ORAL | Status: DC | PRN

## 2015-01-03 MED ORDER — ONDANSETRON HCL 4 MG/2ML IJ SOLN: 4.0000 mg | Freq: Four times a day (QID) | INTRAMUSCULAR | Status: DC | PRN

## 2015-01-03 MED ORDER — INSULIN ASPART 100 UNIT/ML ~~LOC~~ SOLN
0.0000 [IU] | Freq: Three times a day (TID) | SUBCUTANEOUS | Status: DC
  Administered 2015-01-03: 15 [IU] via SUBCUTANEOUS

## 2015-01-03 MED ORDER — LORAZEPAM 2 MG/ML IJ SOLN
1.0000 mg | INTRAMUSCULAR | Status: DC | PRN
  Administered 2015-01-04: 1 mg via INTRAVENOUS
  Filled 2015-01-03: qty 1

## 2015-01-03 MED ORDER — APIXABAN 5 MG PO TABS: 5.0000 mg | ORAL_TABLET | Freq: Two times a day (BID) | ORAL | Status: DC

## 2015-01-03 MED ORDER — MORPHINE SULFATE (PF) 2 MG/ML IV SOLN
2.0000 mg | INTRAVENOUS | Status: DC | PRN
  Administered 2015-01-03: 2 mg via INTRAVENOUS
  Filled 2015-01-03: qty 1

## 2015-01-03 MED ORDER — MORPHINE SULFATE 25 MG/ML IV SOLN
2.0000 mg/h | INTRAVENOUS | Status: DC
  Administered 2015-01-03: 2 mg/h via INTRAVENOUS
  Administered 2015-01-04: 5 mg/h via INTRAVENOUS
  Administered 2015-01-04: 3 mg/h via INTRAVENOUS
  Filled 2015-01-03: qty 10

## 2015-01-03 MED ORDER — SODIUM CHLORIDE 0.9 % IV BOLUS (SEPSIS)
500.0000 mL | Freq: Once | INTRAVENOUS | Status: AC
  Administered 2015-01-03: 500 mL via INTRAVENOUS

## 2015-01-03 MED ORDER — METHYLPREDNISOLONE SODIUM SUCC 125 MG IJ SOLR
60.0000 mg | Freq: Four times a day (QID) | INTRAMUSCULAR | Status: DC
Start: 2015-01-03 — End: 2015-01-03

## 2015-01-03 MED ORDER — HYDROMORPHONE HCL 1 MG/ML IJ SOLN: 1.0000 mg | INTRAMUSCULAR | Status: DC | PRN

## 2015-01-03 NOTE — Progress Notes (Signed)
BiPAP removed per patient and family request. RN at bedside and RT will continue to monitor.

## 2015-01-03 NOTE — Plan of Care (Signed)
RN paged NP stating that pt had expressed stopping treatment and family agreed that pt was "ready to go". NP to bedside.  Brief HPI: Pt admitted earlier today with severe sepsis secondary to PNA among other acute issues. She has a hx of multiple chronic medical issues including pulmonary fibrosis.  Pt is alert and oriented. She tells this NP that she is ready to die and that she is aware of the consequences of removing the bipap mask and stopping all treatment. Multiple family members are present at the bedside including the pt's daughter, Edd Fabian [facility]) and pt's son, Laverna Peace. All family members are in agreement with pt's decision and state she is "tired and ready to go" and they just don't want her to suffer.  This NP had discussion with pt at bedside for clarification purposes of her decision. She is aware that she will likely die without treatment for her present acute illness. When questioned about various modes of treatment, she says she wants "everything" stopped including antibiotics, IVF, Bipap mask, labs, etc. She does not want to be resuscitated or intubated. She and family concur that pt has suffered for years with her lung disease and is ready to go to heaven. She wants to have something to drink.  After discussing these issues with the pt and her family, this NP believes pt is completely aware of her illness and the consequences of getting sicker and even dying with stopping of care. Family at bedside is in agreement. Daughter states pt has talked about this for a long time.  Therefore, to honor the pt's wishes, we will stop all treatment except what is needed to keep the pt comfortable. This includes labs, antibiotics, Bipap, tests, etc. Will start Morphine and remove Bipap. Pt in agreement to home O2 of 3L per [place]. Other meds ordered for comfort.  Hospital chaplain has visited. Pt's personal pastor here with family.  Comfort care plan/meds discussed with pt and family who approve. RN, Ulis Rias, present  for discussion and aware of plan.  Clance Boll, NP [facility]

## 2015-01-03 NOTE — ED Provider Notes (Addendum)
CSN: [id number]     Arrival date & time 01/02/2015  1210 History   First MD Initiated Contact with Patient 01/17/2015 1219     Chief Complaint  Patient presents with  . Respiratory Distress     (Consider location/radiation/quality/duration/timing/severity/associated sxs/prior Treatment) HPI Patient presents with history of multiple medical comorbidities. She does have chronic pulmonary disease on 3 L of home oxygen. Patient's last hospitalization was at the end of October. Patient was found today by family members unresponsive. Patient denies any localizing pain. During transport by EMS patient regained consciousness and was given DuoNeb's and Solu-Medrol. The turgor was found to be significantly elevated at 563. Past Medical History  Diagnosis Date  . HTN (hypertension)   . Diabetes mellitus   . Fibrosis of lung ([place])   . CHF (congestive heart failure) ([place])   . GERD (gastroesophageal reflux disease)   . Chronic headache     migraines  . UTI (urinary tract infection)     " MULTIPLE TIMES THIS PAST YEAR "  . Arthritis   . Anemia   . Cataract   . COPD (chronic obstructive pulmonary disease) ([place])   . Depression   . Osteoporosis   . Hyperlipidemia    Past Surgical History  Procedure Laterality Date  . Neck surgery  1987  . Partial hysterectomy    . Tubal ligation    . Cataract extraction    . Abdominal hysterectomy     Family History  Problem Relation Age of Onset  . Prostate cancer Brother   . Heart disease Brother     MI at age 18  . Stroke Mother     stroke at age 63  . Diabetes Mother   . Asthma Son   . Emphysema Father   . Heart disease Father   . Lung cancer Sister   . Heart disease Sister     MI at 55  . Heart disease Brother     all three brothers have had MI   Social History  Substance Use Topics  . Smoking status: Former Smoker -- 1.00 packs/day for 8 years    Types: Cigarettes    Quit date: 02/20/1968  . Smokeless tobacco: Never Used  . Alcohol Use:  No   OB History    No data available     Review of Systems 10 Systems reviewed and are negative for acute change except as noted in the HPI.    Allergies  Clindamycin/lincomycin; Penicillins; and Tape  Home Medications   Prior to Admission medications   Medication Sig Start Date End Date Taking? Authorizing Provider  albuterol (PROVENTIL) (2.5 MG/3ML) 0.083% nebulizer solution Take 3 mLs (2.5 mg total) by nebulization 3 (three) times daily. 09/14/14  Yes Collene Gobble, MD  apixaban (ELIQUIS) 5 MG TABS tablet Take 1 tablet (5 mg total) by mouth 2 (two) times daily. 12/15/14  Yes Verlee Monte, MD  aspirin 81 MG chewable tablet Chew 1 tablet (81 mg total) by mouth daily. 09/07/14  Yes Lucious Groves, DO  citalopram (CELEXA) 40 MG tablet Take 40 mg by mouth at bedtime.    Yes Historical Provider, MD  Colesevelam HCl 3.75 G PACK Take 1 packet by mouth every evening.   Yes Historical Provider, MD  diltiazem (CARDIZEM CD) 180 MG 24 hr capsule Take 1 capsule (180 mg total) by mouth daily. 12/15/14  Yes Verlee Monte, MD  divalproex (DEPAKOTE ER) 500 MG 24 hr tablet Take 1,000 mg by mouth at bedtime.  Yes Historical Provider, MD  ergocalciferol (VITAMIN D2) 50000 UNITS capsule Take 50,000 Units by mouth every Friday.   Yes Historical Provider, MD  fluticasone (FLONASE) 50 MCG/ACT nasal spray Use 2 sprays in each  nostril daily Patient taking differently: Use 2 sprays in each  nostril twice daily 11/22/14  Yes Collene Gobble, MD  gabapentin (NEURONTIN) 300 MG capsule Take 300 mg by mouth at bedtime.   Yes Historical Provider, MD  HUMALOG KWIKPEN 100 UNIT/ML KiwkPen Inject 11 Units into the skin 3 (three) times daily.  08/09/14  Yes Historical Provider, MD  LEVEMIR FLEXTOUCH 100 UNIT/ML Pen Inject 40 Units into the skin daily with breakfast.  08/09/14  Yes Historical Provider, MD  metolazone (ZAROXOLYN) 2.5 MG tablet Take 1 tablet (2.5 mg total) by mouth as needed (for weight 203 or greater). 11/04/14   Yes Shaune Pascal Bensimhon, MD  ondansetron (ZOFRAN-ODT) 8 MG disintegrating tablet Take 8 mg by mouth every 6 (six) hours as needed for nausea or vomiting.  11/26/14  Yes Historical Provider, MD  OXYGEN Inhale 3 L into the lungs continuous.   Yes Historical Provider, MD  pantoprazole (PROTONIX) 20 MG tablet Take 20 mg by mouth 2 (two) times daily. 12/26/14  Yes Historical Provider, MD  potassium chloride (KLOR-CON) 10 MEQ CR tablet Take 20 mEq by mouth daily before breakfast.    Yes Historical Provider, MD  predniSONE (DELTASONE) 20 MG tablet Take 2 tablets twice daily for 5 days, then take 2 tablets once daily for 5 days and then one tablet once daily as before. 12/14/14  Yes Bonnielee Haff, MD  PROAIR HFA 108 (90 BASE) MCG/ACT inhaler USE 2 PUFFS by mouth EVERY  6 HOURS AS NEEDED FOR  WHEEZING OR SHORTNESS OF  BREATH 12/27/14  Yes Collene Gobble, MD  rOPINIRole (REQUIP) 3 MG tablet Take 3 mg by mouth 2 (two) times daily.     Yes Historical Provider, MD  spironolactone (ALDACTONE) 25 MG tablet Take 25 mg by mouth 2 (two) times daily.  11/12/13  Yes Historical Provider, MD  STIOLTO RESPIMAT 2.5-2.5 MCG/ACT AERS Inhale 2 puffs into the  lungs daily 11/22/14  Yes Collene Gobble, MD  tetrahydrozoline-zinc (VISINE-AC) 0.05-0.25 % ophthalmic solution Place 1 drop into both eyes daily as needed (dry eyes).   Yes Historical Provider, MD  torsemide (DEMADEX) 100 MG tablet Take 50 mg by mouth 2 (two) times daily.   Yes Historical Provider, MD  traMADol (ULTRAM) 50 MG tablet Take 50-100 mg by mouth 2 (two) times daily. Takes 1 tablet in the morning and 2 tablets at night 05/23/11  Yes Tanda Rockers, MD  levofloxacin (LEVAQUIN) 750 MG tablet Take 1 tablet (750 mg total) by mouth every other day. Starting 12/16/14 for 3 more doses Patient not taking: Reported on 12/28/2014 12/14/14   Bonnielee Haff, MD  loratadine (CLARITIN) 10 MG tablet Take 1 tablet (10 mg total) by mouth daily. Patient not taking: Reported on  12/31/2014 02/05/14   Collene Gobble, MD   BP 100/59 mmHg  Pulse 83  Temp(Src) 99.1 F (37.3 C) (Oral)  Resp 20  Wt 187 lb 6.3 oz (85 kg)  SpO2 94% Physical Exam  Constitutional: She is oriented to person, place, and time.  Ill appearance. Awake.  HENT:  Head: Normocephalic and atraumatic.  Eyes: EOM are normal.  Neck: Neck supple.  Cardiovascular:  Tachycardia. Thready peripheral pulses  Pulmonary/Chest:  Increased work of breathing and tachypnea scattered rhonchi  Abdominal: Soft.  Bowel sounds are normal. She exhibits no distension. There is no tenderness.  Musculoskeletal: Normal range of motion.  Neurological: She is alert and oriented to person, place, and time. She has normal strength. Coordination normal. GCS eye subscore is 4. GCS verbal subscore is 5. GCS motor subscore is 6.  Skin: Skin is warm, dry and intact.  Psychiatric: She has a normal mood and affect.    ED Course  Procedures (including critical care time) CRITICAL CARE Performed by: Charlesetta Shanks   Total critical care time: 30 minutes  Critical care time was exclusive of separately billable procedures and treating other patients.  Critical care was necessary to treat or prevent imminent or life-threatening deterioration.  Critical care was time spent personally by me on the following activities: development of treatment plan with patient and/or surrogate as well as nursing, discussions with consultants, evaluation of patient's response to treatment, examination of patient, obtaining history from patient or surrogate, ordering and performing treatments and interventions, ordering and review of laboratory studies, ordering and review of radiographic studies, pulse oximetry and re-evaluation of patient's condition. Labs Review Labs Reviewed  COMPREHENSIVE METABOLIC PANEL - Abnormal; Notable for the following:    Sodium 131 (*)    Chloride 94 (*)    CO2 21 (*)    Glucose, Bld 508 (*)    BUN 34 (*)     Creatinine, Ser 2.27 (*)    Total Protein 5.7 (*)    Albumin 3.0 (*)    GFR calc non Af Amer 20 (*)    GFR calc Af Amer 23 (*)    Anion gap 16 (*)    All other components within normal limits  URINALYSIS, ROUTINE W REFLEX MICROSCOPIC (NOT AT [facility]) - Abnormal; Notable for the following:    Glucose, UA >1000 (*)    All other components within normal limits  CBC WITH DIFFERENTIAL/PLATELET - Abnormal; Notable for the following:    RBC 3.43 (*)    Hemoglobin 11.2 (*)    HCT 34.3 (*)    Neutro Abs 1.6 (*)    All other components within normal limits  BRAIN NATRIURETIC PEPTIDE - Abnormal; Notable for the following:    B Natriuretic Peptide 611.1 (*)    All other components within normal limits  URINE MICROSCOPIC-ADD ON - Abnormal; Notable for the following:    Squamous Epithelial / LPF FEW (*)    Casts HYALINE CASTS (*)    All other components within normal limits  LACTIC ACID, PLASMA - Abnormal; Notable for the following:    Lactic Acid, Venous 12.9 (*)    All other components within normal limits  GLUCOSE, CAPILLARY - Abnormal; Notable for the following:    Glucose-Capillary 524 (*)    All other components within normal limits  GLUCOSE, CAPILLARY - Abnormal; Notable for the following:    Glucose-Capillary 509 (*)    All other components within normal limits  CULTURE, BLOOD (ROUTINE X 2)  CULTURE, BLOOD (ROUTINE X 2)  URINE CULTURE  MRSA PCR SCREENING  INFLUENZA PANEL BY PCR (TYPE A & B, H1N1)  PROCALCITONIN  I-STAT TROPOININ, ED    Imaging Review No results found. I have personally reviewed and evaluated these images and lab results as part of my medical decision-making.   EKG Interpretation   Date/Time:  Monday January 03 2015 12:15:42 EST Ventricular Rate:  130 PR Interval:    QRS Duration: 68 QT Interval:  343 QTC Calculation: 504 R Axis:   108 Text  Interpretation:  Right and left arm electrode reversal,  interpretation assumes no reversal Junctional tachycardia  Right axis  deviation Lateral infarct, acute Prolonged QT interval Baseline wander in  lead(s) V3 st elevation Confirmed by Johnney Killian, MD, Jeannie Done [id number]) on  12/30/2014 12:32:16 PM     Cardiology 12:40 Coitor[place].  MDM   Final diagnoses:  HCAP (healthcare-associated pneumonia)  Hyperglycemia  Acute on chronic respiratory failure with hypoxia [facility])   Patient has had recent hospitalization. At this point time findings are most suggestive of HCAP. Sepsis protocol initiated. Patient to be admitted for further treatment. Cardiology has been consult it and EKG reviewed. Patient has multiple severe medical comorbidities.    Charlesetta Shanks, MD 01/14/15 1607  Charlesetta Shanks, MD 01/14/15 [phone]

## 2015-01-03 NOTE — Progress Notes (Signed)
Patient admitted to 2C06 from ED. Bipap in place. CHG bath given. No evidence of respiratory distress at this time. Family at bedside.

## 2015-01-03 NOTE — Progress Notes (Signed)
Patient's BS 509 notified Dr. Marily Memos. Instructed to follow SSI and give 15 units and notified MD on call for any changes.

## 2015-01-03 NOTE — Progress Notes (Signed)
Patient requested to have all treatment stopped at this time. Patient requested Bipap to be removed and IV fluids stopped. Patient stated, " I am ready to go be with my husband in heaven". Patient's family is at the bedside and agree that patient is tired and "ready to go". On call [facility] text paged, awaiitng call back. Chaplin phoned and comfort cart ordered.

## 2015-01-03 NOTE — Progress Notes (Signed)
Chaplain was paged by nurse as pt has asked to stop treatment. Family was by pt's bedside. Family and Pt wanted prayer. Pt's Penny Curry was there as well. Chaplain asked if he wanted to take the lead but he relented. Chaplain prayed with family and gave a word of comfort and departed.   12/27/2014 2000  Clinical Encounter Type  Visited With Patient and family together  Visit Type Spiritual support  Referral From Nurse  Spiritual Encounters  Spiritual Needs Prayer;Grief support  Stress Factors  Patient Stress Factors None identified;Loss  Family Stress Factors Loss

## 2015-01-03 NOTE — ED Notes (Signed)
Upper and lower dentures given to daughter , clothing transported to 2600 with  Pt

## 2015-01-03 NOTE — H&P (Signed)
[facility] History and Physical  Penny Curry [id number] DOB: 01/26/1941 DOA: 12/28/2014  Referring physician: Emergency Department PCP: Ronita Hipps, MD   CHIEF COMPLAINT:   Respiratory failure  HPI: Penny Curry is a 74 y.o. female Patient is a 74 year old female with multiple medical problems. She has a history of atrial fibrillation, maintained on Eliquis, paroxysmal atrial fibrillation, uncontrolled type 2 diabetes, and interstitial lung disease on continuous 3L at home. Patient was hospitalized late October COPD exacerbation,  She has been on a tapering dose of Prednisone since discharge.  Patient found unresponsive by Granddaughter today. She's been coughing up brown sputum over the last week. Over the last couple of weeks patient's had about 4 falls at home. She is becoming increasingly weaker. Blood sugars have been very high at home - on increased dose of prednisone.    ED COURSE:    EMS administered nebulizers and Solu-Medrol CBG in 500's.   Fever and heart rate down after Tylenol suppository. Improving on Bipap  Currently in process of receiving 500 cc bolus of normal saline (patient has been moving around, difficult to maintain infusion of fluids)  Labs:   Sodium 131, serum bicarb 21, BUN 34, creatinine 2.27, glucose 500, anion gap 16,  wbc 4.8 Troponin POC 0.05, BNP 287.   Urinalysis:   Clear, rare bacteria, hyaline cast present, glucose greater than 1000, negative ketones, 0-2 wbc, negative nitrite   CXR:   Bilateral lower lobe infiltrates  EKG:    Right and left arm electrode reversal, interpretation assumes no reversal Junctional tachycardia Right axis deviation Lateral infarct, acute Prolonged QT interval Baseline wander in lead(s) V3 st elevation Confirmed by Johnney Killian, MD, Jeannie Done [phone]) on 01/17/2015 12:32:16 PM   Medications  0.9 %  sodium chloride infusion ( Intravenous New Bag/Given 01/02/2015 1341)  aztreonam (AZACTAM) 1 g in dextrose 5  % 50 mL IVPB (not administered)  vancomycin (VANCOCIN) 1,500 mg in sodium chloride 0.9 % 500 mL IVPB (not administered)  acetaminophen (TYLENOL) suppository 650 mg (650 mg Rectal Given 01/13/2015 1225)  sodium chloride 0.9 % bolus 500 mL (500 mLs Intravenous New Bag/Given 01/10/2015 1340)    Review of Systems  Unable to perform ROS: acuity of condition   Past Medical History  Diagnosis Date  . HTN (hypertension)   . Diabetes mellitus   . Fibrosis of lung ([place])   . CHF (congestive heart failure) ([place])   . GERD (gastroesophageal reflux disease)   . Chronic headache     migraines  . UTI (urinary tract infection)     " MULTIPLE TIMES THIS PAST YEAR "  . Arthritis   . Anemia   . Cataract   . COPD (chronic obstructive pulmonary disease) ([place])   . Depression   . Osteoporosis   . Hyperlipidemia    Past Surgical History  Procedure Laterality Date  . Neck surgery  1987  . Partial hysterectomy    . Tubal ligation    . Cataract extraction    . Abdominal hysterectomy      SOCIAL HISTORY:  reports that she quit smoking about 46 years ago. Her smoking use included Cigarettes. She has a 8 pack-year smoking history. She has never used smokeless tobacco. She reports that she does not drink alcohol or use illicit drugs. Lives: with Granddaughter at home   Assistive devices:  Uses cane and walker for ambulation.   Allergies  Allergen Reactions  . Clindamycin/Lincomycin Swelling and Rash  . Penicillins Swelling and Rash      .  Tape Itching and Rash    Paper tape is ok.    Family History  Problem Relation Age of Onset  . Prostate cancer Brother   . Heart disease Brother     MI at age 66  . Stroke Mother     stroke at age 97  . Diabetes Mother   . Asthma Son   . Emphysema Father   . Heart disease Father   . Lung cancer Sister   . Heart disease Sister     MI at 12  . Heart disease Brother     all three brothers have had MI    Prior to Admission medications   Medication Sig  Start Date End Date Taking? Authorizing Provider  albuterol (PROVENTIL) (2.5 MG/3ML) 0.083% nebulizer solution Take 3 mLs (2.5 mg total) by nebulization 3 (three) times daily. 09/14/14   Collene Gobble, MD  apixaban (ELIQUIS) 5 MG TABS tablet Take 1 tablet (5 mg total) by mouth 2 (two) times daily. 12/15/14   Verlee Monte, MD  aspirin 81 MG chewable tablet Chew 1 tablet (81 mg total) by mouth daily. 09/07/14   Lucious Groves, DO  Benzonatate (TESSALON PO) Take 1 capsule by mouth daily as needed (cough).    Historical Provider, MD  citalopram (CELEXA) 40 MG tablet Take 40 mg by mouth at bedtime.     Historical Provider, MD  Colesevelam HCl 3.75 G PACK Take 1 packet by mouth every evening.    Historical Provider, MD  diltiazem (CARDIZEM CD) 180 MG 24 hr capsule Take 1 capsule (180 mg total) by mouth daily. 12/15/14   Verlee Monte, MD  divalproex (DEPAKOTE ER) 500 MG 24 hr tablet Take 1,000 mg by mouth at bedtime.    Historical Provider, MD  ergocalciferol (VITAMIN D2) 50000 UNITS capsule Take 50,000 Units by mouth every Friday.    Historical Provider, MD  fluticasone (FLONASE) 50 MCG/ACT nasal spray Use 2 sprays in each  nostril daily Patient taking differently: Use 2 sprays in each  nostril twice daily 11/22/14   Collene Gobble, MD  gabapentin (NEURONTIN) 300 MG capsule Take 300 mg by mouth at bedtime.    Historical Provider, MD  HUMALOG KWIKPEN 100 UNIT/ML KiwkPen Inject 11 Units into the skin 3 (three) times daily.  08/09/14   Historical Provider, MD  LEVEMIR FLEXTOUCH 100 UNIT/ML Pen Inject 40 Units into the skin daily with breakfast.  08/09/14   Historical Provider, MD  levofloxacin (LEVAQUIN) 750 MG tablet Take 1 tablet (750 mg total) by mouth every other day. Starting 12/16/14 for 3 more doses 12/14/14   Bonnielee Haff, MD  loratadine (CLARITIN) 10 MG tablet Take 1 tablet (10 mg total) by mouth daily. Patient not taking: Reported on 12/31/2014 02/05/14   Collene Gobble, MD  metolazone (ZAROXOLYN)  2.5 MG tablet Take 1 tablet (2.5 mg total) by mouth as needed (for weight 203 or greater). 11/04/14   Jolaine Artist, MD  ondansetron (ZOFRAN-ODT) 8 MG disintegrating tablet Take 8 mg by mouth every 6 (six) hours as needed for nausea or vomiting.  11/26/14   Historical Provider, MD  OXYGEN Inhale 3 L into the lungs continuous.    Historical Provider, MD  pantoprazole (PROTONIX) 40 MG tablet Take 40 mg by mouth daily.     Historical Provider, MD  potassium chloride (KLOR-CON) 10 MEQ CR tablet Take 20 mEq by mouth daily before breakfast.     Historical Provider, MD  predniSONE (DELTASONE) 20 MG tablet  Take 2 tablets twice daily for 5 days, then take 2 tablets once daily for 5 days and then one tablet once daily as before. 12/14/14   Bonnielee Haff, MD  PROAIR HFA 108 (90 BASE) MCG/ACT inhaler USE 2 PUFFS by mouth EVERY  6 HOURS AS NEEDED FOR  WHEEZING OR SHORTNESS OF  BREATH 12/27/14   Collene Gobble, MD  rOPINIRole (REQUIP) 3 MG tablet Take 3 mg by mouth 2 (two) times daily.      Historical Provider, MD  spironolactone (ALDACTONE) 25 MG tablet Take 25 mg by mouth 2 (two) times daily.  11/12/13   Historical Provider, MD  STIOLTO RESPIMAT 2.5-2.5 MCG/ACT AERS Inhale 2 puffs into the  lungs daily 11/22/14   Collene Gobble, MD  tetrahydrozoline-zinc (VISINE-AC) 0.05-0.25 % ophthalmic solution Place 1 drop into both eyes daily as needed (dry eyes).    Historical Provider, MD  torsemide (DEMADEX) 100 MG tablet Take 50 mg by mouth 2 (two) times daily.    Historical Provider, MD  traMADol (ULTRAM) 50 MG tablet Take 50-100 mg by mouth 2 (two) times daily. Takes 1 tablet in the morning and 2 tablets at night 05/23/11   Tanda Rockers, MD   PHYSICAL EXAM: Filed Vitals:   12/26/2014 1215 12/31/2014 1219 01/11/2015 1223 01/18/2015 1347  BP: 105/25 103/25    Pulse: 129 131    Temp:  104.9 F (40.5 C)  102.6 F (39.2 C)  TempSrc:  Rectal  Rectal  Resp:  38    SpO2: 98% 98% 98%     Wt Readings from Last 3 Encounters:   12/31/14 86.637 kg (191 lb)  12/23/14 85.843 kg (189 lb 4 oz)  12/16/14 83.689 kg (184 lb 8 oz)    General:  Pleasant white female. Appears calm and comfortable on Bipap Eyes: PER, normal lids, irises & conjunctiva ENT: grossly normal hearing Neck: no LAD, no masses Cardiovascular:  Sinus tachycardia, No LE edema.  Respiratory: tachypneic, Decreased breath sounds at bilateral bases. Poor participation in pulmonary exam  Abdomen: soft, non-distended, non-tender, active bowel sounds. No obvious masses.  Skin: no rash seen on limited exam Musculoskeletal: grossly normal tone BUE/BLE Psychiatric: grossly normal mood and affect, speech fluent and appropriate Neurologic: grossly non-focal.         LABS ON ADMISSION:    Basic Metabolic Panel:  Recent Labs Lab 01/19/2015 1236  NA 131*  K 4.8  CL 94*  CO2 21*  GLUCOSE 508*  BUN 34*  CREATININE 2.27*  CALCIUM 9.2   Liver Function Tests:  Recent Labs Lab 01/05/2015 1236  AST 35  ALT 33  ALKPHOS 58  BILITOT 0.9  PROT 5.7*  ALBUMIN 3.0*    CBC:  Recent Labs Lab 01/12/2015 1236  WBC 4.8  NEUTROABS 1.6*  HGB 11.2*  HCT 34.3*  MCV 100.0  PLT 209    BNP (last 3 results)  Recent Labs  10/22/14 1200 12/09/14 1930 12/23/14 1045  BNP 107.8* 93.1 287.3*    CREATININE: 2.27 mg/dL ABNORMAL (01/16/2015 1236) Estimated creatinine clearance - 24 mL/min  Radiological Exams on Admission: Dg Chest Portable 1 View  12/22/2014  CLINICAL DATA:  Fever, recent PNA, SOB EXAM: PORTABLE CHEST 1 VIEW COMPARISON:  12/14/2014 FINDINGS: Heart size is normal accounting for AP position. There patchy infiltrates in the lung bases bilaterally. No definite pulmonary edema. IMPRESSION: Bilateral lower lobe infiltrates. Electronically Signed   By: Nolon Nations M.D.   On: 12/30/2014 13:03    ASSESSMENT /  PLAN    Sepsis with fever, hypotension, tachycardia, and tachypnea. Suspect HCAP.  -Admit to stepdown -Flu  panel -Nebulizers -Continue fluid resuscitation. Volume may be limited by DNI -Azactam and vancomycin per pharmacy  -Solumedrol 60mg  IV Q 6 hours -Currently on Bipap. Hold home PO meds for now.   Acute on chronic respiratory failure secondary to HCAP. She has underlying interstitial lung disease (? Sarcoidosis) and on chronic prednisone. Sats now the upper nineties on BiPAP. -continue RT  Acute myocardial infarct.  POC 0.05. EDP spoke with Cardiology, patient not interventional candidate.  Uncontrolled type 2 diabetes. Blood sugar > 500 on steroids.  Place on SSI.   Paroxysymal atrial fibrillation. On Eilquis. Tachycardia due in part to fever and improving after tylenol (now 109) -continue home Eliquis and Cardizem (when able to take PO).    History of diastolic heart failure. Last echo September 2016 revealed grade 1 diastolic dysfunction. Estimated ejection fraction 55-60%.  Hypertension. Given hypotension, hold home BP for now.   Depression. Resume home anti-depressant when able to take PO   Consultants:  Cardiology - EDP spoke with Cardiologist over telephone regarding MI. Not candidate for intervention  Pulmonary CCM - I spoke over phone to Dr. Chase Caller regarding need for steroids. Patient on tapering dose Prednisone since last admission. Now admitted with HCAP.Pulmonary has recommended increasing steroid dose to Solumedrol 60mg  Q6.

## 2015-01-03 NOTE — ED Notes (Signed)
Report called  

## 2015-01-03 NOTE — Progress Notes (Signed)
CRITICAL VALUE ALERT  Critical value received:  Lactic Acid 12.9  Date of notification:  01/09/2015  Time of notification:  1930  Critical value read back:Yes.    Nurse who received alert:  Lester Kinsman RN   MD notified (1st page):  Yes  Time of first page:  1930  MD notified (2nd page):  Time of second page:  Responding MD:  Baltazar Najjar   Time MD responded:  [phone]

## 2015-01-03 NOTE — Progress Notes (Signed)
   01/09/2015 1244  Clinical Encounter Type  Visited With Family;Health care provider  Visit Type Initial  Referral From Nurse   Chaplain responded to a request from the nursing staff to get patient's family (daughter and grand-daughter) to a consultation room for an update. Chaplain helped family get to a room, updated medical staff, and offered support/hospitality to the family. Chaplain support available as needed.   Jeri Lager, Chaplain 01/01/2015 12:46 PM

## 2015-01-03 NOTE — Progress Notes (Addendum)
ANTIBIOTIC CONSULT NOTE - INITIAL  Pharmacy Consult for aztreonam/vanc Indication: HCAP   Patient Measurements:     Vital Signs: Temp: 104.9 F (40.5 C) (11/14 1219) Temp Source: Rectal (11/14 1219) BP: 103/25 mmHg (11/14 1219) Pulse Rate: 131 (11/14 1219) Intake/Output from previous day:   Intake/Output from this shift:    Labs:  Recent Labs  12/30/2014 1236  WBC 4.8  HGB 11.2*  PLT 209  CREATININE 2.27*   Estimated Creatinine Clearance: 24 mL/min (by C-G formula based on Cr of 2.27). No results for input(s): VANCOTROUGH, VANCOPEAK, VANCORANDOM, GENTTROUGH, GENTPEAK, GENTRANDOM, TOBRATROUGH, TOBRAPEAK, TOBRARND, AMIKACINPEAK, AMIKACINTROU, AMIKACIN in the last 72 hours.   Microbiology: Recent Results (from the past 720 hour(s))  Culture, blood (routine x 2)     Status: None (Preliminary result)   Collection Time: 12/28/2014 12:18 PM  Result Value Ref Range Status   Specimen Description BLOOD LEFT ANTECUBITAL  Final   Special Requests BOTTLES DRAWN AEROBIC AND ANAEROBIC 5ML  Final   Culture PENDING  Incomplete   Report Status PENDING  Incomplete  Culture, blood (routine x 2)     Status: None (Preliminary result)   Collection Time: 01/05/2015 12:25 PM  Result Value Ref Range Status   Specimen Description BLOOD LEFT HAND  Final   Special Requests BOTTLES DRAWN AEROBIC AND ANAEROBIC 5ML  Final   Culture PENDING  Incomplete   Report Status PENDING  Incomplete    Medical History: Past Medical History  Diagnosis Date  . HTN (hypertension)   . Diabetes mellitus   . Fibrosis of lung ([place])   . CHF (congestive heart failure) ([place])   . GERD (gastroesophageal reflux disease)   . Chronic headache     migraines  . UTI (urinary tract infection)     " MULTIPLE TIMES THIS PAST YEAR "  . Arthritis   . Anemia   . Cataract   . COPD (chronic obstructive pulmonary disease) ([place])   . Depression   . Osteoporosis   . Hyperlipidemia     Assessment: 62 yof with respiratory  distress. Recent hospital admission and discharged on 12/23/14. Pharmacy consulted to dose aztreonam/vancomycin for HCAP. Tmax/24h 104.9, wbc wnl, SCr 2.27 on admit, CrCl~24.  11/14 aztreonam>> 11/14 vanc>>  11/14 BCx2>>  Goal of Therapy:  Eradication of infection  Plan:  Aztreonam 1g IV q8h Vanc 1500mg  x 1 dose; then vanc 1g IV q24h Monitor clinical progress, c/s, renal function, abx plan/LOT  Elicia Lamp, PharmD Clinical Pharmacist Pager [phone] 12/22/2014 1:39 PM

## 2015-01-03 NOTE — ED Notes (Signed)
Pt here from home found unresponsive by home health nurse and daughter , EMS states that pt stayed that way until about 5 mins from hospital , pt received 4 duo nebs and 1 albuterol 125 solumedrol cbg 563

## 2015-01-03 NOTE — Progress Notes (Signed)
Patient transported from ED to room 2C06 without any complications.  Will continue to monitor.

## 2015-01-04 LAB — URINE CULTURE: Culture: 1000

## 2015-01-04 MED ORDER — IPRATROPIUM-ALBUTEROL 0.5-2.5 (3) MG/3ML IN SOLN: 3.0000 mL | RESPIRATORY_TRACT | Status: DC | PRN

## 2015-01-04 MED ORDER — HYOSCYAMINE SULFATE 0.125 MG SL SUBL
0.2500 mg | SUBLINGUAL_TABLET | SUBLINGUAL | Status: DC | PRN
  Filled 2015-01-04: qty 2

## 2015-01-07 ENCOUNTER — Inpatient Hospital Stay: Payer: Medicare Other | Admitting: Emergency Medicine

## 2015-01-08 LAB — CULTURE, BLOOD (ROUTINE X 2)
CULTURE: NO GROWTH
Culture: NO GROWTH

## 2015-01-09 DIAGNOSIS — I509 Heart failure, unspecified: Secondary | ICD-10-CM | POA: Diagnosis not present

## 2015-01-09 DIAGNOSIS — R079 Chest pain, unspecified: Secondary | ICD-10-CM | POA: Diagnosis not present

## 2015-01-09 DIAGNOSIS — E119 Type 2 diabetes mellitus without complications: Secondary | ICD-10-CM | POA: Diagnosis not present

## 2015-01-09 DIAGNOSIS — J449 Chronic obstructive pulmonary disease, unspecified: Secondary | ICD-10-CM | POA: Diagnosis not present

## 2015-01-13 DIAGNOSIS — F329 Major depressive disorder, single episode, unspecified: Secondary | ICD-10-CM | POA: Diagnosis not present

## 2015-01-13 DIAGNOSIS — K589 Irritable bowel syndrome without diarrhea: Secondary | ICD-10-CM | POA: Diagnosis not present

## 2015-01-13 DIAGNOSIS — E785 Hyperlipidemia, unspecified: Secondary | ICD-10-CM | POA: Diagnosis not present

## 2015-01-13 DIAGNOSIS — E569 Vitamin deficiency, unspecified: Secondary | ICD-10-CM | POA: Diagnosis not present

## 2015-01-19 DIAGNOSIS — J449 Chronic obstructive pulmonary disease, unspecified: Secondary | ICD-10-CM | POA: Diagnosis not present

## 2015-01-20 NOTE — Progress Notes (Signed)
Patient's without any v/s at 0428. Patient's daughter Edd Fabian at the bedside. Call placed to on call  [facility] provider. Baltazar Najjar PA in to assess. Patient's daughter requested we wait for patient's sons to say good bye before moving the body. Ffamily in to morn their loss. Post mortem care provided. Funeral home in to collect body from the unit.

## 2015-01-20 NOTE — Progress Notes (Signed)
Wasted 236cc of Morphine. Wasted by Elmyra Ricks WallerRN/Bernadette Roncallo RN at [phone] 14-Jan-2015.

## 2015-01-20 NOTE — Progress Notes (Signed)
Pt expired at [place]. Death certificate completed. Edd Fabian, daughter [facility]), at bedside. Condolences offered. Family at peace with pt's passing. Other family on the way to hospital.  Clance Boll, NP [facility]

## 2015-01-20 DEATH — deceased

## 2015-02-23 ENCOUNTER — Encounter (HOSPITAL_COMMUNITY): Payer: Medicare Other | Admitting: Internal Medicine

## 2015-03-23 NOTE — Discharge Summary (Signed)
Death Summary  Penny Curry [id number] DOB: Feb 04, 1941 DOA: 2015-02-02  PCP: Ronita Hipps, MD PCP/Office notified: 02/23/15  Admit date: 02-02-2015 Date of Death: 2014/04/04  Final Diagnoses:  Active Problems:   Acute-on-chronic respiratory failure (HCC)   CKD (chronic kidney disease) stage 2, GFR 60-89 ml/min   Chronic diastolic heart failure (HCC)   PAF (paroxysmal atrial fibrillation) ([place] [facility])   HCAP (healthcare-associated pneumonia)   Sepsis ([place])   Depression   Acute on chronic respiratory failure with hypoxia ([place]) 1.      History of present illness:  Penny Curry is a 75 y.o. female Patient is a 75 year old female with multiple medical problems. She has a history of atrial fibrillation, maintained on Eliquis, paroxysmal atrial fibrillation, uncontrolled type 2 diabetes, and interstitial lung disease on continuous 3L at home. Patient was hospitalized late October COPD exacerbation, She has been on a tapering dose of Prednisone since discharge.  Patient found unresponsive by Granddaughter today. She's been coughing up brown sputum over the last week. Over the last couple of weeks patient's had about 4 falls at home. She is becoming increasingly weaker. Blood sugars have been very high at home - on increased dose of prednisone.   Hospital Course:  Acute on chronic respiratory failure: Patient presented to the ED on 02-Feb-2015 with acute on chronic respiratory failure secondary to hospital-acquired pneumonia and progression of CHF. She is also found to be septic at the time. She was initially started on aggressive therapies including antibiotics, steroids, BiPAP, and fluid resuscitation. However after some time patient expressed desires to be allowed to pass naturally and she was made comfort measures at that time. Over the course of her hospitalization respiratory status mental status continued to deteriorate until her passing at 04:08 on Feb 03, 2015. Family were at bedside at time of  passing.    Signed:  Maribel Hadley J  [facility] 02/23/2015, 9:53 AM

## 2017-04-25 IMAGING — CT CT CHEST HIGH RESOLUTION W/O CM
2 of 6 series · 12 of 36 positions shown, 15 images · non-contrast
Comparison: Chest CT 07/27/2013.

CLINICAL DATA: 74-year-old female with history of sarcoidosis. On
oxygen for chronic shortness of breath. History of congestive heart
failure.

EXAM:
CT CHEST WITHOUT CONTRAST
TECHNIQUE: Multidetector CT imaging of the chest was performed following the
standard protocol without intravenous contrast. High resolution
imaging of the lungs, as well as inspiratory and expiratory imaging,
was performed.

[Series 5: lung · axial · 0.65mm/px · z∈[-260,-55]mm · 9 of 53 slices shown, 12 images]
[im 6/53  mediastinal]
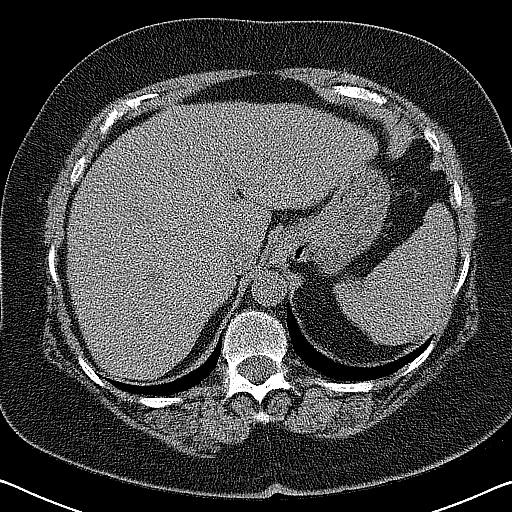
[im 6/53  lung]
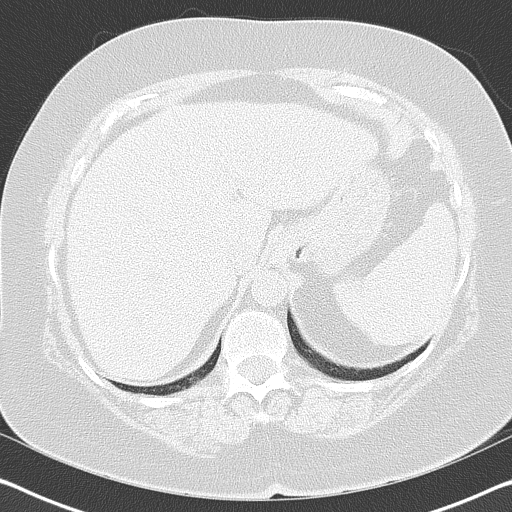
[im 11/53  lung]
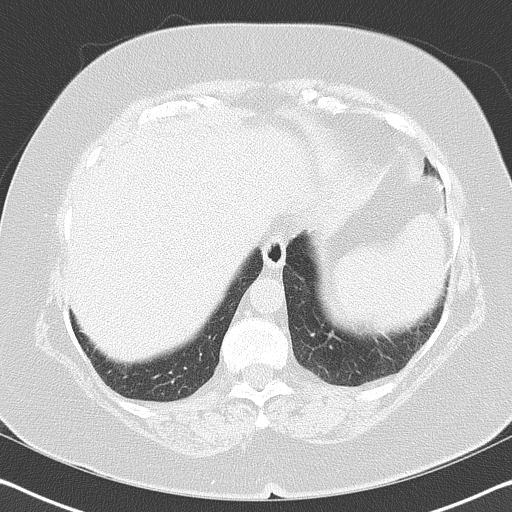
[im 16/53  lung]
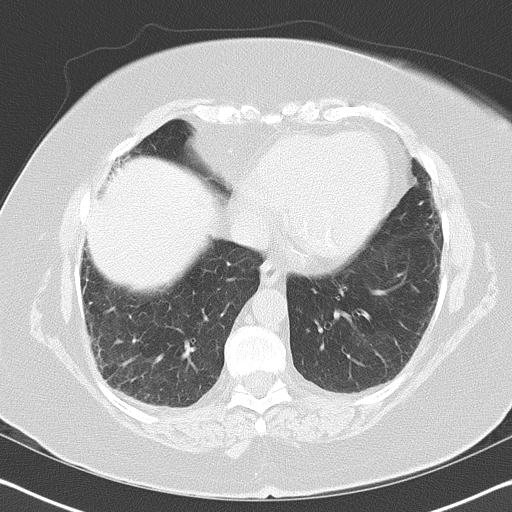
[im 21/53  lung]
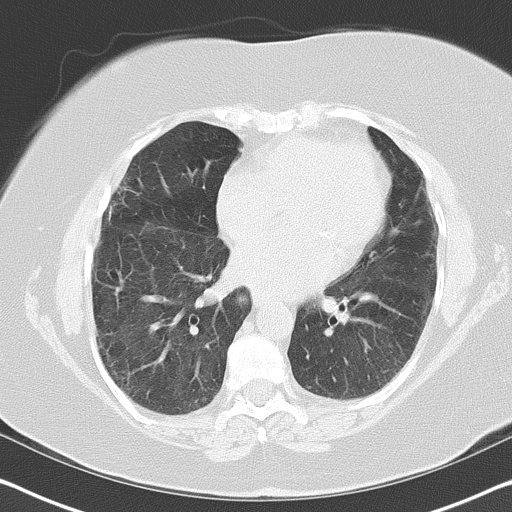
[im 27/53  mediastinal]
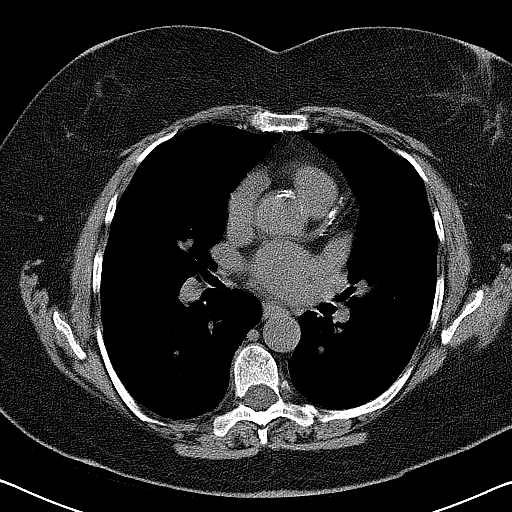
[im 27/53  lung]
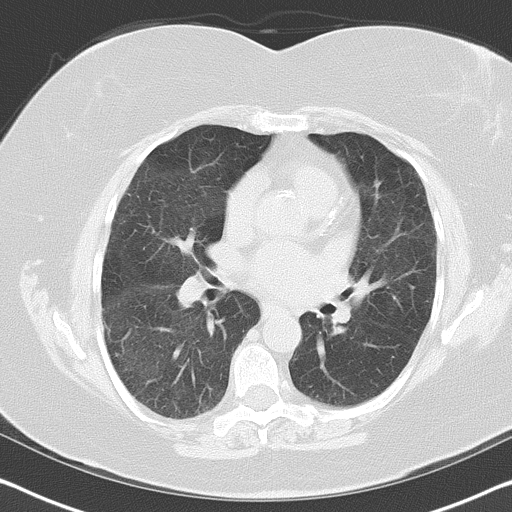
[im 32/53  lung]
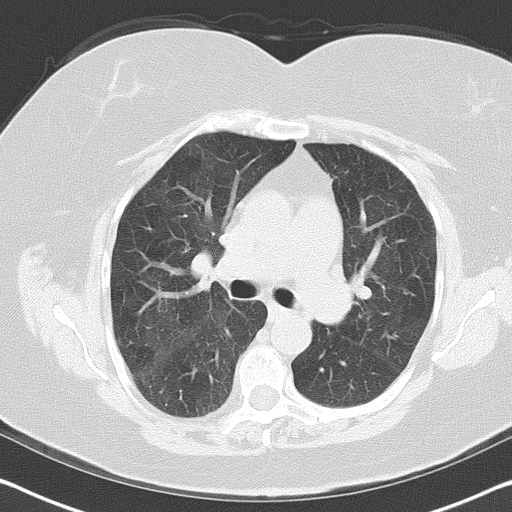
[im 37/53  lung]
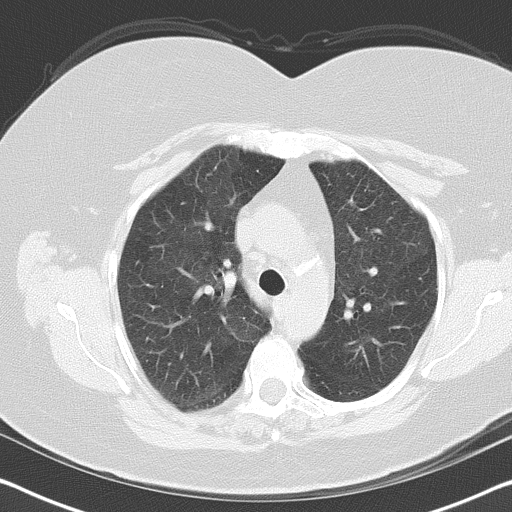
[im 42/53  lung]
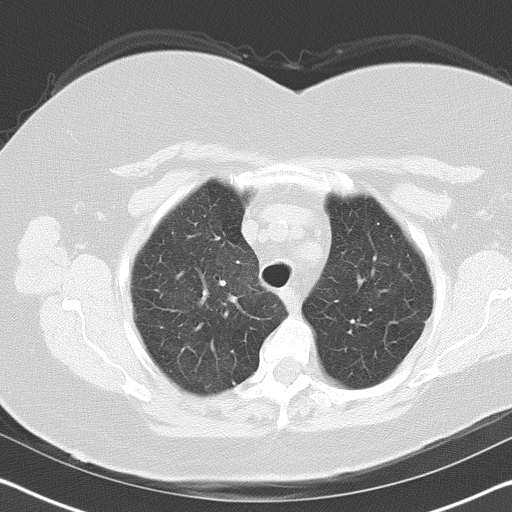
[im 47/53  mediastinal]
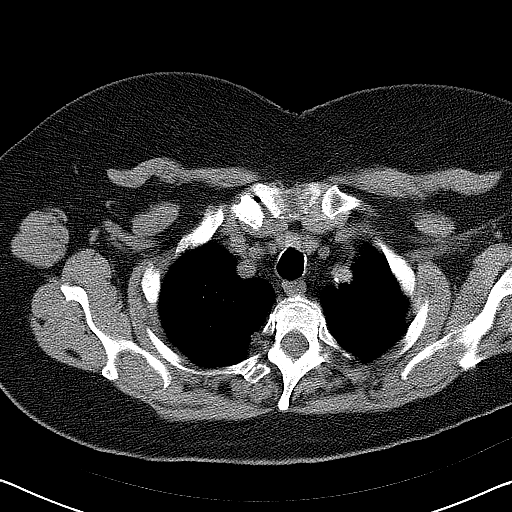
[im 47/53  lung]
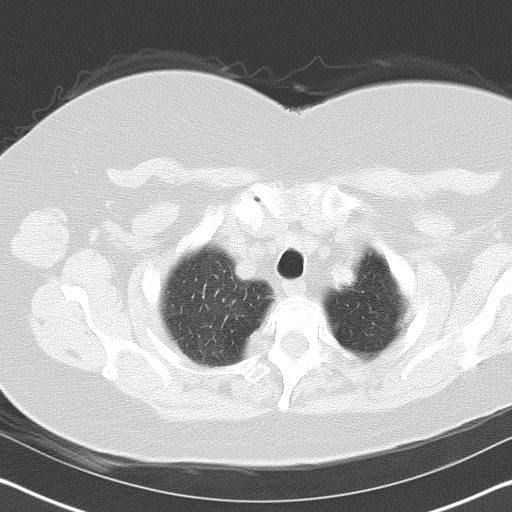

[Series 602: cor · coronal · 0.65mm/px · 3 of 119 slices shown]
[im 24/119  lung]
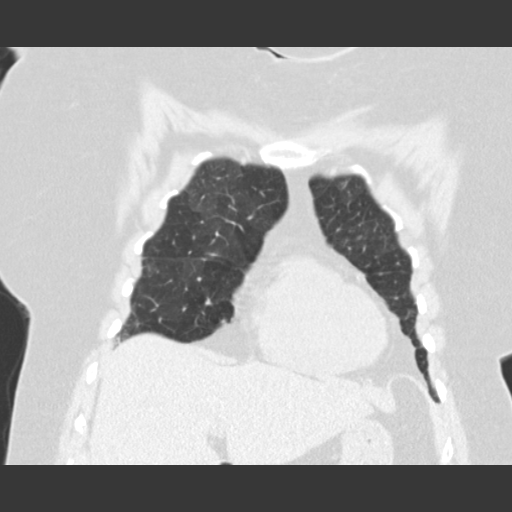
[im 48/119  lung]
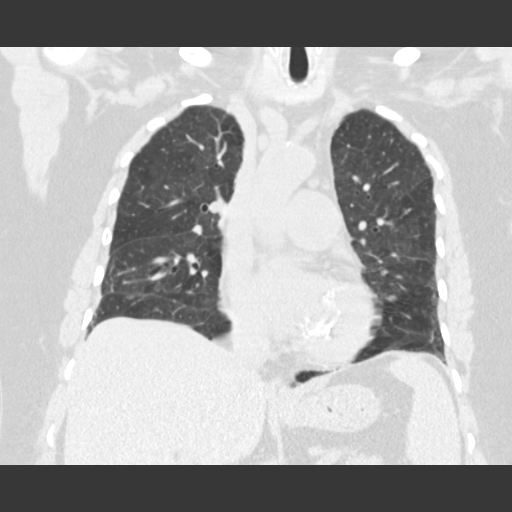
[im 71/119  lung]
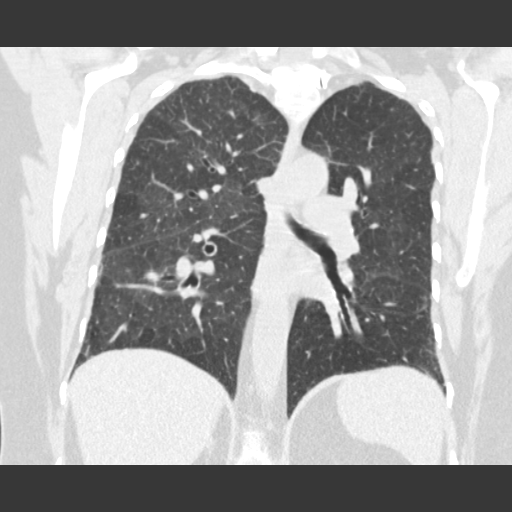

[12 of 36 positions shown; findings below may reference images not displayed]

FINDINGS: Mediastinum/Lymph Nodes: Heart size is borderline enlarged with left
atrial dilatation. There is no significant pericardial fluid,
thickening or pericardial calcification. There is atherosclerosis of
the thoracic aorta, the great vessels of the mediastinum and the
coronary arteries, including calcified atherosclerotic plaque in the
left anterior descending, left circumflex and right coronary
arteries. Calcifications of the aortic valve. Severe calcifications
of the mitral annulus. No pathologically enlarged mediastinal or
hilar lymph nodes. Please note that accurate exclusion of hilar
adenopathy is limited on noncontrast CT scans. Esophagus is
unremarkable in appearance. No axillary lymphadenopathy.

Lungs/Pleura: Again noted is a pattern of relatively diffuse
ground-glass attenuation throughout the lungs bilaterally with
scattered areas of septal thickening and mild peripheral
bronchiolectasis. Very mild peripheral subpleural reticulation is
also noted, predominantly in the lower lobes of the lungs. No frank
honeycombing identified. Inspiratory and expiratory imaging
demonstrates moderate air trapping, indicative of small airways
disease. There are several more focal nodular appearing areas of
ground-glass attenuation in the lungs bilaterally, the largest of
which is in the posterior aspect of the left upper lobe on image 18
of series 5, which currently measures approximately 2.2 x 2.4 cm.
This ground-glass attenuation area appears slightly larger than
prior studies, however, this is less solid in appearance than prior
examinations (the central solid component of 6 mm on today's
examination, versus nearly 12 mm on the prior study when measured in
a similar fashion). The other nodular ground-glass attenuation areas
are in the right upper lobe (image 13 of series 5) measuring 7 x 10
mm, in the right upper lobe (image 9 of series 5) measuring 11 x 12
mm, in the left upper lobe (image 24 of series 5) measuring 12 x 8
mm, and in the medial aspect of the right lower lobe (image 33 of
series 5) measuring 9 x 9 mm, all of which are similar to prior
examinations. No other new suspicious appearing pulmonary nodules or
masses are noted.

Upper Abdomen: Atherosclerosis.

Musculoskeletal/Soft Tissues: There are no aggressive appearing
lytic or blastic lesions noted in the visualized portions of the
skeleton.
IMPRESSION: 1. The appearance of the lungs is very unusual, but is similar to
prior examinations, as detailed above. There is a spectrum of
findings could be seen in the setting of sarcoidosis, as sarcoidosis
has many appearances, however, the findings are not classical for
sarcoidosis. Findings may alternatively reflect an interstitial lung
disease such as nonspecific interstitial pneumonia (NSIP).
2. Several more focal areas of somewhat nodular appearing
ground-glass attenuation are noted in the lungs bilaterally, and are
generally stable in size and appearance to prior studies. The
exception to this is the largest lesion in the posterior aspect of
the left upper lobe which appears slightly larger than the prior
examination overall, but is progressively less solid in appearance
over several prior examinations. While this is favored to be a
benign lesion, continued attention on followup studies is
recommended for all of these lesions to exclude the possibility of a
slow-growing neoplasm such as adenocarcinoma.
3. Atherosclerosis, including three-vessel coronary artery disease.
Assessment for potential risk factor modification, dietary therapy
or pharmacologic therapy may be warranted, if clinically indicated.
4. There are calcifications of the mitral valve and annulus, and
aortic valve. Echocardiographic correlation for evaluation of
potential valvular dysfunction may be warranted if clinically
indicated.
